# Patient Record
Sex: Female | Born: 1970 | Race: Black or African American | Hispanic: No | Marital: Married | Smoking: Never smoker
Health system: Southern US, Community
[De-identification: ages and names within clinical notes are randomized; demographics above are authoritative.]

## PROBLEM LIST (undated history)

## (undated) DIAGNOSIS — F3281 Premenstrual dysphoric disorder: Secondary | ICD-10-CM

## (undated) DIAGNOSIS — I10 Essential (primary) hypertension: Secondary | ICD-10-CM

## (undated) DIAGNOSIS — E1159 Type 2 diabetes mellitus with other circulatory complications: Secondary | ICD-10-CM

## (undated) DIAGNOSIS — E1165 Type 2 diabetes mellitus with hyperglycemia: Secondary | ICD-10-CM

## (undated) DIAGNOSIS — E785 Hyperlipidemia, unspecified: Secondary | ICD-10-CM

## (undated) DIAGNOSIS — E781 Pure hyperglyceridemia: Secondary | ICD-10-CM

## (undated) DIAGNOSIS — I2510 Atherosclerotic heart disease of native coronary artery without angina pectoris: Secondary | ICD-10-CM

## (undated) DIAGNOSIS — Z91148 Patient's other noncompliance with medication regimen for other reason: Secondary | ICD-10-CM

## (undated) DIAGNOSIS — IMO0001 Reserved for inherently not codable concepts without codable children: Secondary | ICD-10-CM

## (undated) DIAGNOSIS — I219 Acute myocardial infarction, unspecified: Secondary | ICD-10-CM

## (undated) DIAGNOSIS — Z9114 Patient's other noncompliance with medication regimen: Secondary | ICD-10-CM

## (undated) HISTORY — DX: Atherosclerotic heart disease of native coronary artery without angina pectoris: I25.10

## (undated) HISTORY — PX: CORONARY STENT PLACEMENT: SHX1402

## (undated) HISTORY — DX: Type 2 diabetes mellitus with other circulatory complications: E11.59

## (undated) HISTORY — DX: Reserved for inherently not codable concepts without codable children: IMO0001

## (undated) HISTORY — DX: Patient's other noncompliance with medication regimen: Z91.14

## (undated) HISTORY — DX: Patient's other noncompliance with medication regimen for other reason: Z91.148

## (undated) HISTORY — DX: Hyperlipidemia, unspecified: E78.5

## (undated) HISTORY — DX: Type 2 diabetes mellitus with hyperglycemia: E11.65

## (undated) HISTORY — DX: Premenstrual dysphoric disorder: F32.81

## (undated) HISTORY — DX: Essential (primary) hypertension: I10

## (undated) HISTORY — DX: Pure hyperglyceridemia: E78.1

## (undated) HISTORY — DX: Morbid (severe) obesity due to excess calories: E66.01

## (undated) HISTORY — DX: Acute myocardial infarction, unspecified: I21.9

---

## 1999-06-11 ENCOUNTER — Emergency Department (HOSPITAL_COMMUNITY): Admission: EM | Admit: 1999-06-11 | Discharge: 1999-06-11 | Payer: Self-pay | Admitting: Emergency Medicine

## 1999-10-28 ENCOUNTER — Other Ambulatory Visit: Admission: RE | Admit: 1999-10-28 | Discharge: 1999-10-28 | Payer: Self-pay | Admitting: Obstetrics and Gynecology

## 2000-11-11 ENCOUNTER — Other Ambulatory Visit: Admission: RE | Admit: 2000-11-11 | Discharge: 2000-11-11 | Payer: Self-pay | Admitting: Obstetrics and Gynecology

## 2000-11-12 ENCOUNTER — Ambulatory Visit (HOSPITAL_COMMUNITY): Admission: RE | Admit: 2000-11-12 | Discharge: 2000-11-12 | Payer: Self-pay | Admitting: Gastroenterology

## 2000-12-17 ENCOUNTER — Other Ambulatory Visit: Admission: RE | Admit: 2000-12-17 | Discharge: 2000-12-17 | Payer: Self-pay | Admitting: Obstetrics and Gynecology

## 2000-12-17 ENCOUNTER — Encounter (INDEPENDENT_AMBULATORY_CARE_PROVIDER_SITE_OTHER): Payer: Self-pay

## 2001-03-04 ENCOUNTER — Ambulatory Visit (HOSPITAL_COMMUNITY): Admission: RE | Admit: 2001-03-04 | Discharge: 2001-03-04 | Payer: Self-pay | Admitting: Obstetrics and Gynecology

## 2001-03-04 ENCOUNTER — Encounter (INDEPENDENT_AMBULATORY_CARE_PROVIDER_SITE_OTHER): Payer: Self-pay | Admitting: Specialist

## 2001-06-01 ENCOUNTER — Other Ambulatory Visit: Admission: RE | Admit: 2001-06-01 | Discharge: 2001-06-01 | Payer: Self-pay | Admitting: Obstetrics and Gynecology

## 2001-09-30 ENCOUNTER — Other Ambulatory Visit: Admission: RE | Admit: 2001-09-30 | Discharge: 2001-09-30 | Payer: Self-pay | Admitting: Obstetrics and Gynecology

## 2002-02-08 ENCOUNTER — Other Ambulatory Visit: Admission: RE | Admit: 2002-02-08 | Discharge: 2002-02-08 | Payer: Self-pay | Admitting: Obstetrics and Gynecology

## 2002-06-22 ENCOUNTER — Other Ambulatory Visit: Admission: RE | Admit: 2002-06-22 | Discharge: 2002-06-22 | Payer: Self-pay | Admitting: Obstetrics and Gynecology

## 2002-09-27 ENCOUNTER — Other Ambulatory Visit: Admission: RE | Admit: 2002-09-27 | Discharge: 2002-09-27 | Payer: Self-pay | Admitting: Obstetrics and Gynecology

## 2003-03-13 ENCOUNTER — Other Ambulatory Visit: Admission: RE | Admit: 2003-03-13 | Discharge: 2003-03-13 | Payer: Self-pay | Admitting: Obstetrics and Gynecology

## 2003-04-15 ENCOUNTER — Encounter (INDEPENDENT_AMBULATORY_CARE_PROVIDER_SITE_OTHER): Payer: Self-pay

## 2003-04-15 ENCOUNTER — Ambulatory Visit (HOSPITAL_COMMUNITY): Admission: RE | Admit: 2003-04-15 | Discharge: 2003-04-15 | Payer: Self-pay | Admitting: Obstetrics and Gynecology

## 2003-04-19 ENCOUNTER — Encounter (INDEPENDENT_AMBULATORY_CARE_PROVIDER_SITE_OTHER): Payer: Self-pay

## 2003-04-19 ENCOUNTER — Ambulatory Visit (HOSPITAL_COMMUNITY): Admission: AD | Admit: 2003-04-19 | Discharge: 2003-04-19 | Payer: Self-pay | Admitting: Obstetrics and Gynecology

## 2003-10-16 ENCOUNTER — Other Ambulatory Visit: Admission: RE | Admit: 2003-10-16 | Discharge: 2003-10-16 | Payer: Self-pay | Admitting: Obstetrics and Gynecology

## 2004-05-14 ENCOUNTER — Ambulatory Visit (HOSPITAL_COMMUNITY): Admission: RE | Admit: 2004-05-14 | Discharge: 2004-05-14 | Payer: Self-pay | Admitting: *Deleted

## 2004-07-04 ENCOUNTER — Encounter (INDEPENDENT_AMBULATORY_CARE_PROVIDER_SITE_OTHER): Payer: Self-pay | Admitting: Specialist

## 2004-07-04 ENCOUNTER — Ambulatory Visit (HOSPITAL_COMMUNITY): Admission: RE | Admit: 2004-07-04 | Discharge: 2004-07-04 | Payer: Self-pay | Admitting: General Surgery

## 2004-10-07 ENCOUNTER — Emergency Department (HOSPITAL_COMMUNITY): Admission: EM | Admit: 2004-10-07 | Discharge: 2004-10-07 | Payer: Self-pay | Admitting: Emergency Medicine

## 2008-01-10 ENCOUNTER — Emergency Department (HOSPITAL_COMMUNITY): Admission: EM | Admit: 2008-01-10 | Discharge: 2008-01-10 | Payer: Self-pay | Admitting: Emergency Medicine

## 2008-10-23 ENCOUNTER — Emergency Department (HOSPITAL_COMMUNITY): Admission: EM | Admit: 2008-10-23 | Discharge: 2008-10-23 | Payer: Self-pay | Admitting: Family Medicine

## 2009-03-07 ENCOUNTER — Inpatient Hospital Stay (HOSPITAL_COMMUNITY): Admission: EM | Admit: 2009-03-07 | Discharge: 2009-03-10 | Payer: Self-pay | Admitting: Emergency Medicine

## 2009-03-22 ENCOUNTER — Encounter (HOSPITAL_COMMUNITY): Admission: RE | Admit: 2009-03-22 | Discharge: 2009-06-20 | Payer: Self-pay | Admitting: Interventional Cardiology

## 2010-11-08 LAB — GLUCOSE, CAPILLARY
Glucose-Capillary: 183 mg/dL — ABNORMAL HIGH (ref 70–99)
Glucose-Capillary: 195 mg/dL — ABNORMAL HIGH (ref 70–99)
Glucose-Capillary: 207 mg/dL — ABNORMAL HIGH (ref 70–99)
Glucose-Capillary: 212 mg/dL — ABNORMAL HIGH (ref 70–99)
Glucose-Capillary: 217 mg/dL — ABNORMAL HIGH (ref 70–99)
Glucose-Capillary: 219 mg/dL — ABNORMAL HIGH (ref 70–99)
Glucose-Capillary: 229 mg/dL — ABNORMAL HIGH (ref 70–99)
Glucose-Capillary: 271 mg/dL — ABNORMAL HIGH (ref 70–99)
Glucose-Capillary: 145 mg/dL — ABNORMAL HIGH (ref 70–99)
Glucose-Capillary: 148 mg/dL — ABNORMAL HIGH (ref 70–99)
Glucose-Capillary: 153 mg/dL — ABNORMAL HIGH (ref 70–99)
Glucose-Capillary: 160 mg/dL — ABNORMAL HIGH (ref 70–99)
Glucose-Capillary: 160 mg/dL — ABNORMAL HIGH (ref 70–99)

## 2010-11-09 LAB — POCT I-STAT, CHEM 8
BUN: 25 mg/dL — ABNORMAL HIGH (ref 6–23)
BUN: 30 mg/dL — ABNORMAL HIGH (ref 6–23)
Calcium, Ion: 1.19 mmol/L (ref 1.12–1.32)
Chloride: 99 mEq/L (ref 96–112)
Creatinine, Ser: 0.7 mg/dL (ref 0.4–1.2)
Creatinine, Ser: 0.8 mg/dL (ref 0.4–1.2)
Glucose, Bld: 335 mg/dL — ABNORMAL HIGH (ref 70–99)
Glucose, Bld: 392 mg/dL — ABNORMAL HIGH (ref 70–99)
HCT: 47 % — ABNORMAL HIGH (ref 36.0–46.0)
Hemoglobin: 16 g/dL — ABNORMAL HIGH (ref 12.0–15.0)
Potassium: 4.6 mEq/L (ref 3.5–5.1)
Sodium: 123 mEq/L — ABNORMAL LOW (ref 135–145)
Sodium: 130 mEq/L — ABNORMAL LOW (ref 135–145)
TCO2: 21 mmol/L (ref 0–100)
TCO2: 24 mmol/L (ref 0–100)

## 2010-11-09 LAB — CBC
HCT: 41.7 % (ref 36.0–46.0)
Hemoglobin: 13.4 g/dL (ref 12.0–15.0)
Hemoglobin: 14.1 g/dL (ref 12.0–15.0)
MCHC: 33.7 g/dL (ref 30.0–36.0)
MCV: 87.8 fL (ref 78.0–100.0)
MCV: 88 fL (ref 78.0–100.0)
Platelets: 385 10*3/uL (ref 150–400)
RBC: 4.13 MIL/uL (ref 3.87–5.11)
RBC: 4.44 MIL/uL (ref 3.87–5.11)
RBC: 4.74 MIL/uL (ref 3.87–5.11)
RDW: 11.6 % (ref 11.5–15.5)
WBC: 11.5 10*3/uL — ABNORMAL HIGH (ref 4.0–10.5)
WBC: 12.5 10*3/uL — ABNORMAL HIGH (ref 4.0–10.5)

## 2010-11-09 LAB — GLUCOSE, CAPILLARY
Glucose-Capillary: 171 mg/dL — ABNORMAL HIGH (ref 70–99)
Glucose-Capillary: 206 mg/dL — ABNORMAL HIGH (ref 70–99)
Glucose-Capillary: 197 mg/dL — ABNORMAL HIGH (ref 70–99)
Glucose-Capillary: 300 mg/dL — ABNORMAL HIGH (ref 70–99)
Glucose-Capillary: 332 mg/dL — ABNORMAL HIGH (ref 70–99)
Glucose-Capillary: 348 mg/dL — ABNORMAL HIGH (ref 70–99)
Glucose-Capillary: 372 mg/dL — ABNORMAL HIGH (ref 70–99)
Glucose-Capillary: 430 mg/dL — ABNORMAL HIGH (ref 70–99)

## 2010-11-09 LAB — BASIC METABOLIC PANEL
BUN: 15 mg/dL (ref 6–23)
BUN: 23 mg/dL (ref 6–23)
CO2: 21 mEq/L (ref 19–32)
CO2: 22 mEq/L (ref 19–32)
CO2: 24 mEq/L (ref 19–32)
Calcium: 10.2 mg/dL (ref 8.4–10.5)
Chloride: 100 mEq/L (ref 96–112)
Chloride: 93 mEq/L — ABNORMAL LOW (ref 96–112)
Chloride: 99 mEq/L (ref 96–112)
Creatinine, Ser: 0.68 mg/dL (ref 0.4–1.2)
Creatinine, Ser: 0.9 mg/dL (ref 0.4–1.2)
GFR calc Af Amer: >60 mL/min (ref >60)
GFR calc Af Amer: >60 mL/min (ref >60)
GFR calc non Af Amer: >60 mL/min (ref >60)
Glucose, Bld: 206 mg/dL — ABNORMAL HIGH (ref 70–99)
Glucose, Bld: 397 mg/dL — ABNORMAL HIGH (ref 70–99)
Glucose, Bld: 444 mg/dL — ABNORMAL HIGH (ref 70–99)
Potassium: 3.8 mEq/L (ref 3.5–5.1)
Potassium: 4.5 mEq/L (ref 3.5–5.1)
Potassium: 4.7 mEq/L (ref 3.5–5.1)
Sodium: 127 mEq/L — ABNORMAL LOW (ref 135–145)
Sodium: 128 mEq/L — ABNORMAL LOW (ref 135–145)

## 2010-11-09 LAB — POCT I-STAT 3, ART BLOOD GAS (G3+)
Acid-base deficit: 6 mmol/L — ABNORMAL HIGH (ref 0.0–2.0)
O2 Saturation: 99 %
TCO2: 22 mmol/L (ref 0–100)
pCO2 arterial: 42.7 mmHg (ref 35.0–45.0)
pO2, Arterial: 135 mmHg — ABNORMAL HIGH (ref 80.0–100.0)

## 2010-11-09 LAB — TROPONIN I: Troponin I: 0.05 ng/mL (ref 0.00–0.06)

## 2010-11-09 LAB — PROTIME-INR
INR: 0.9 (ref 0.00–1.49)
Prothrombin Time: 12.4 seconds (ref 11.6–15.2)

## 2010-11-09 LAB — HEMOGLOBIN A1C
Hgb A1c MFr Bld: 11.6 % — ABNORMAL HIGH (ref 4.6–6.1)
Mean Plasma Glucose: 286 mg/dL
Mean Plasma Glucose: 286 mg/dL

## 2010-11-09 LAB — CARDIAC PANEL(CRET KIN+CKTOT+MB+TROPI)
CK, MB: 81.7 ng/mL — ABNORMAL HIGH (ref 0.3–4.0)
Relative Index: 14.8 — ABNORMAL HIGH (ref 0.0–2.5)
Relative Index: 15.1 — ABNORMAL HIGH (ref 0.0–2.5)
Relative Index: 15.5 — ABNORMAL HIGH (ref 0.0–2.5)
Total CK: 365 U/L — ABNORMAL HIGH (ref 7–177)
Troponin I: 5.39 ng/mL (ref 0.00–0.06)

## 2010-11-09 LAB — DIFFERENTIAL
Basophils Absolute: 0 10*3/uL (ref 0.0–0.1)
Basophils Relative: 0 % (ref 0–1)
Eosinophils Absolute: 0 10*3/uL (ref 0.0–0.7)
Eosinophils Relative: 0 % (ref 0–5)
Lymphocytes Relative: 13 % (ref 12–46)
Lymphs Abs: 1.5 10*3/uL (ref 0.7–4.0)
Monocytes Absolute: 0.3 10*3/uL (ref 0.1–1.0)
Monocytes Relative: 3 % (ref 3–12)
Neutro Abs: 9.7 10*3/uL — ABNORMAL HIGH (ref 1.7–7.7)
Neutrophils Relative %: 84 % — ABNORMAL HIGH (ref 43–77)

## 2010-11-09 LAB — CK TOTAL AND CKMB (NOT AT ARMC)
CK, MB: 1.7 ng/mL (ref 0.3–4.0)
Relative Index: INVALID (ref 0.0–2.5)
Total CK: 88 U/L (ref 7–177)

## 2010-11-09 LAB — POCT CARDIAC MARKERS

## 2010-11-09 LAB — LIPID PANEL
Cholesterol: 256 mg/dL — ABNORMAL HIGH (ref 0–200)
LDL Cholesterol: 164 mg/dL — ABNORMAL HIGH (ref 0–99)
Triglycerides: 224 mg/dL — ABNORMAL HIGH (ref <150)
VLDL: 45 mg/dL — ABNORMAL HIGH (ref 0–40)

## 2010-11-09 LAB — APTT: aPTT: 26 seconds (ref 24–37)

## 2010-12-17 NOTE — Consult Note (Signed)
NAME:  Barbara Jacobs, GORBY NO.:  [id number]   MEDICAL RECORD NO.:  [id number]          PATIENT TYPE:  INP   LOCATION:  2033                         FACILITY:  [facility]   PHYSICIAN:  Arne Cleveland, MD       DATE OF BIRTH:  [birth year]   DATE OF CONSULTATION:  DATE OF DISCHARGE:                                 CONSULTATION   REQUESTING PHYSICIAN:  Lyn Records, MD   Dr. Katrinka Blazing requested consult to review his current orders regarding her  diabetes and to see if his management was appropriate and make  recommendations regarding her diabetic management.  Ms. Christell Constant is a 40-  year-old African American female who was recently admitted with acute  onset of chest pain, taken to the cath and found to have 100% occlusion  of the right coronary artery and had successful stenting of the  posterior descending artery or the right coronary artery.  The patient's  other arteries were patent and had good heart function.  Postop, the  patient has had high sugars, which corresponds with her high-degree of  stress following an acute MI.  The patient had been well controlled with  sliding scale.  She was continued on her Actos, but her sugars remained  high, so Dr. Katrinka Blazing added Glucotrol 5 mg daily to her regimen of Actos.   PHYSICAL EXAMINATION:  GENERAL:  She is a well-developed, well-nourished  African American female obese, in no acute distress.  Her sugars have  been high, but controlled on a sliding scale.  She is adding Glucotrol  to her regimen.  HEAD, EYES, EARS, NOSE, AND THROAT:  Appeared normal.  CHEST:  Clear to auscultation and percussion.  HEART:  Regular rhythm and rate.  Normal S1 and S2 without murmur,  gallop, or rub.  ABDOMEN:  Obese, soft, and nontender with normoactive bowel sounds.  No  hepatomegaly.  No splenomegaly.  No palpable mass.  EXTREMITIES:  No clubbing, cyanosis, or edema.   My impression is diabetes poorly controlled, post acute myocardial  infarction, and  stenting.  The patient currently is under significant  stress from her acute event and adding Glucotrol to control her  hyperglycemia is appropriate.  I agree with Dr. Michaelle Copas current  management.   Plan is to continue with Dr. Michaelle Copas current management.  Recommend to  the patient that she enter a cardiac rehab as well as dietary evaluation  for diabetes, recommend American Diabetes and American Heart Association  1800-calorie diet.  I also recommended to the patient that she should  monitor her sugars with fingersticks 3 times a day for first week until  she goes back and sees her primary care doctor as a stress from her  acute event decreases her requirement for the Glucotrol may go down  especially if she is continuing on a heart-healthy diabetic diet.  The  patient also has hypertension, hyperlipidemia, which are being also  correctly managed by Dr. Katrinka Blazing and have no recommendations other than  his current medicines.  The patient may be able to be discharged home in  her regimen of Glucotrol 5 mg daily, metoprolol 50 mg twice a day,  lisinopril 20 mg daily, and hydrochlorothiazide 25 mg daily  is very global for this situation.  The patient should continue to  monitor her sugar and risk factors, diabetic, cardiovascular; dietary  and exercise, which will come under play with her cardiac rehab and  should be followed up as an outpatient.      Arne Cleveland, MD  Electronically Signed     ML/MEDQ  D:  03/09/2009  T:  03/10/2009  Job:  [id number]

## 2010-12-17 NOTE — Cardiovascular Report (Signed)
NAMECAMREIGH, MICHIE                 ACCOUNT NO.:  [id number]   MEDICAL RECORD NO.:  [id number]          PATIENT TYPE:  INP   LOCATION:  2906                         FACILITY:  [facility]   PHYSICIAN:  Lyn Records, M.D.   DATE OF BIRTH:  06-May-1971   DATE OF PROCEDURE:  03/07/2009  DATE OF DISCHARGE:                            CARDIAC CATHETERIZATION   INDICATION FOR THE PROCEDURE:  Acute inferior infarction starting at  around 3:30 p.m.   PROCEDURE PERFORMED:  1. Left heart catheterization.  2. Selective coronary angiography.  3. Left ventriculography.  4. Bare metal stent posterior descending of the right coronary.   DESCRIPTION:  The patient was brought emergently to the catheterization  laboratory after presenting with inferior ST elevation and 4 hours of  continuous chest discomfort described as a pressure.  The EKG in the  emergency room demonstrated inferior ST elevation.   After explaining the procedure to the patient, she agreed to proceed  with emergency catheterization.  A 6-French sheath was placed using the  modified Seldinger technique.  Xylocaine 1% was used for local  anesthesia.   We then performed coronary angiography using a 6-French A2 multipurpose  catheter.  This was used for hemodynamic recordings and left  ventriculography by a hand injection.  We were unable to cannulate the  native coronaries with the catheter because of a relatively short aortic  root.  We then used a #4, 6-French left Judkins and a #4, 6-French right  coronary guide catheter for angiography.  After the right coronary  shots, we were able to demonstrate that there was total occlusion of the  PDA.   Angiomax was then administered.  ACT was documented to be 368 seconds.  The patient received 60 mg of Effient.  Angioplasty was performed using  an [employer] guidewire, a 2.0 and then a 2.25 mm diameter, 12-mm  long apex balloon, and ultimately a 2.25 mm x 12-mm long Vision  Multilink  stent was placed in the region of the stenosis and 2 balloon  inflations were performed.  Peak pressure 12 atmospheres.   There was resolution of ST elevation and chest discomfort with  reperfusion.  The patient did develop nausea and some increased chest  discomfort immediately post reperfusion.  No significant arrhythmias  occurred.   The patient's right groin was reprepped.  A sheath injection was  performed.  An Angio-Seal was used for hemostasis with success.  The  patient was given 1 mg of IV Ancef.   RESULTS:  1. Hemodynamic data:      a.     Aortic pressure 133/85.      b.     Left ventricular pressure 137/14.  2. Left ventriculography:  Inferolateral hypokinesis.  EF 60%.  3. Coronary angiography.      a.     Left main coronary:  Widely patent.      b.     Left anterior descending coronary:  Large and wraps around       left ventricular apex.  Luminal irregularities are noted after a  large bifurcating first diagonal.  The diagonal and its branches       are both normal.      c.     Circumflex artery:  Small and widely patent.      d.     Right coronary:  Right coronary is a large, dominant vessel       that gives a small codominant first PDA and a larger totally       occluded second PDA.  A smaller left ventricular branch was noted       beyond the origin of the second PDA branch.  4. PCI result:  PDA totally occluded to 0% stenosis after angioplasty      and bare metal stent to 0% stenosis and TIMI grade 3 flow.   CONCLUSIONS:  1. Acute inferior myocardial infarction 3-1/2 to 4 hours old by the      time of arrival.  2. Successful angioplasty and stenting of the PDA with reduction in      stenosis from 100% to 0% with TIMI grade 3 flow.  Symptom onset was      at 1530.  Reperfusion occurred at 2120.  Arrival time in the ER is      unclear.  3. Overall normal LV function with inferior hypokinesis.  4. Widely patent LAD and circumflex.   PLAN:  1. Aspirin and  Effient.  2. Risk factor modification.  3. Statin therapy should be added to the patient's regimen.  4. Weight reduction will be stressed.  5. Cardiac rehab.  6. Cycle enzymes.      Lyn Records, M.D.  Electronically Signed     HWS/MEDQ  D:  03/07/2009  T:  03/08/2009  Job:  [id number]   cc:   Kendrick Ranch, M.D.

## 2010-12-17 NOTE — Discharge Summary (Signed)
Barbara Jacobs, Barbara Jacobs                 ACCOUNT NO.:  [id number]   MEDICAL RECORD NO.:  [id number]          PATIENT TYPE:  INP   LOCATION:  2033                         FACILITY:  [facility]   PHYSICIAN:  Lyn Records, M.D.   DATE OF BIRTH:  03/01/1971   DATE OF ADMISSION:  03/07/2009  DATE OF DISCHARGE:                               DISCHARGE SUMMARY   DISCHARGE DIAGNOSES:  1. Acute inferior myocardial infarction.      a.     Treated with bare-metal stent to the proximal posterior       descending artery on March 07, 2009.      b.     Overall normal left ventricular function with ejection       fraction of 60%, inferior wall moderate hypokinesis.  2. Hypertension, poorly controlled.  3. Diabetes mellitus, poorly controlled.      a.     Initial blood sugars greater than 400.      b.     Hemoglobin A1c greater than 11.  4. Obesity.   PROCEDURES PERFORMED:  Emergency coronary angiography and bare-metal  stent to the PDA on March 07, 2009.   DISCHARGE INSTRUCTIONS:  1. Follow with Dr. Constance Goltz on March 19, 2009 for further      management, instructions concerning diabetes.  2. Follow up with Dr. Verdis Prime on March 21, 2009 at 11 a.m.   MEDICATIONS AT DISCHARGE:  1. Effient 10 mg per day for 30 days.  2. Coated aspirin 325 mg per day for an indefinite period of time      (lifetime).  3. Lisinopril/hydrochlorothiazide 20/25 mg daily.  4. Metoprolol succinate 50 mg daily.  5. Simvastatin 80 mg daily.  6. Nitroglycerin 0.4 mg sublingually p.r.n.  7. Actos 15 mg daily as before.  8. Glucotrol XL 5 mg daily.   ACTIVITIES:  Enroll in phase II cardiac rehab.  The patient is  restricted from working for at least 2 weeks and certainly until after  being seen by Dr. Katrinka Blazing on March 21, 2009.  Specific instructions are  given to call if recurrent chest pain, require a nitroglycerin.   DIET:  Heart healthy 1800-calorie ADA diet.   CONDITION ON DISCHARGE:  Improved.   HOSPITAL COURSE:   The patient suddenly developed chest discomfort  shortly after lunch on the afternoon of March 07, 2009.  She interpreted  the septum to represent indigestion.  She eventually came to the  Emergency Room at [facility] in the early evening, around 8  o'clock where her EKG revealed inferior ST elevation.  She was taken  immediately to the catheterization laboratory where she was found to  have a totally occluded proximal portion of her PDA.  Angioplasty and  bare-metal stenting was performed with good results and resolution of ST  elevation as well as symptoms.   The post PCI course was unremarkable.  Her medications were titrated to  achieve good blood pressure control, and to aim towards improved control  of her blood sugars.  Initial blood sugars were greater than 400.   Renal  function is normal with creatinine is less than 1.2.   Peak CPK was 550/80.   EKG at discharge reveals inferior Q-waves in II, III, and aVF.  Ejection  fraction and catheterization, however, was normal with inferobasal  moderate hypokinesis.   Part of the patient's difficulty with blood sugar control during the  hospitalization was intravenous Solu-Medrol administration prior to cath  because of a history of shellfish allergy.      Lyn Records, M.D.  Electronically Signed     HWS/MEDQ  D:  03/09/2009  T:  03/09/2009  Job:  [id number]   cc:   Kendrick Ranch, M.D.

## 2010-12-17 NOTE — Cardiovascular Report (Signed)
NAMETAWSHA, TERRERO NO.:  [id number]   MEDICAL RECORD NO.:  [id number]           PATIENT TYPE:   LOCATION:                                 FACILITY:   PHYSICIAN:  Lyn Records, M.D.   DATE OF BIRTH:  07/25/1945   DATE OF PROCEDURE:  03/07/2009  DATE OF DISCHARGE:                            CARDIAC CATHETERIZATION   ADDENDUM   [name] was brought to the cardiac catheterization laboratory  emergently.  She is a relatively short, obese [place] American female  who has a short aortic root.  Because of this anatomy, multiple  catheter changes were necessary to visualize the coronary anatomy.  Additionally because of her obesity, access to the femoral artery took  more time than usual.  These 2 factors increased the time to  reperfusion.      Lyn Records, M.D.  Electronically Signed     HWS/MEDQ  D:  03/22/2009  T:  03/22/2009  Job:  [id number]

## 2010-12-20 NOTE — Op Note (Signed)
NAME:  Barbara Jacobs, Barbara Jacobs                 ACCOUNT NO.:  [id number]   MEDICAL RECORD NO.:  [id number]          PATIENT TYPE:  AMB   LOCATION:  DAY                          FACILITY:  [facility]   PHYSICIAN:  Ollen Gross. Vernell Morgans, M.D. DATE OF BIRTH:  14-Apr-1971   DATE OF PROCEDURE:  07/04/2004  DATE OF DISCHARGE:                                 OPERATIVE REPORT   PREOPERATIVE DIAGNOSIS:  Anal condyloma.   POSTOPERATIVE DIAGNOSIS:  Anal condyloma.   PROCEDURE:  Destruction of anal condyloma.   SURGEON:  Dr. Carolynne Edouard.   ANESTHESIA:  General endotracheal.   PROCEDURE:  After informed consent was obtained, the patient was brought to  the operating room and placed in a supine position on the operating room  table. After adequate induction of general anesthesia, the patient was  placed in the lithotomy position. Her perirectal area was prepped with  Betadine and draped in usual sterile manner. The patient had 1 tuft of  condyloma posteriorly that appeared to be on a stalk. This was excised  sharply at the base of the stalk with the Metzenbaum scissors, and  hemostasis was then achieved using the Bovie electrocautery. Two other small  areas in the left perirectal area were identified, and each of these were  destroyed with the Bovie electrocautery. No other areas in the perirectal  skin or in the anal canal were able to be identified. The perirectal area  was infiltrated with 0.25% Marcaine with epinephrine, and dibucaine ointment  was applied to the areas, and sterile dressings were applied. The patient  tolerated the procedure well. At the end of the case, all needle, sponge,  and instrument counts were correct. The patient was then awakened and taken  to recovery room in stable condition.     Renae Fickle   PST/MEDQ  D:  07/04/2004  T:  07/04/2004  Job:  [id number]

## 2010-12-20 NOTE — Op Note (Signed)
NAME:  Barbara Jacobs, Barbara Jacobs                           ACCOUNT NO.:  [id number]   MEDICAL RECORD NO.:  [id number]                   PATIENT TYPE:  AMB   LOCATION:  MATC                                 FACILITY:  [facility]   PHYSICIAN:  Maxie Better, M.D.            DATE OF BIRTH:  29-Apr-1971   DATE OF PROCEDURE:  04/19/2003  DATE OF DISCHARGE:                                 OPERATIVE REPORT   PREOPERATIVE DIAGNOSES:  Hematometria.   PROCEDURE:  1. Suction dilation evacuation.  2. Excision of right buttock lesion.   POSTOPERATIVE DIAGNOSES:  1. Hematometria.  2. Right buttock lesion.   ANESTHESIA:  Saddle block; paracervical block.   SURGEON:  Maxie Better, M.D.   INDICATIONS:  This is a 40 year old para 1 female who is status post an  elective termination on April 15, 2003.  She presented to the office  with complaints of heavy vaginal bleeding since Monday, and who on  ultrasound was found to have an echogenic 4 cm endometrial stripe;  consistent with possible blood of retained products of conception.  She now  presents for surgical management.  The patient has chronic hypertension and  diabetes.  She denies any fever.  She has had minimal cramps.  Risks and  benefits and plans of the procedure were explained to the patient.   DESCRIPTION OF PROCEDURE:  Under adequate saddle block, the patient was  placed in dorsal lithotomy position.  Her vulva was noted to be smeared with  blood.  The patient was sterilely prepped and draped in the usual fashion.  The bladder was catheterized of a small amount of urine.   Bimanual examination revealed an anteverted uterus, about 7 weeks size;  which was smaller than what had been examined in the office.  No adnexal  masses could be appreciated.   The patient was then draped in the usual sterile fashion.  A bivalve  speculum was placed in the vagina.  A single-toothed tenaculum was placed on  the anterior lip of the cervix.  Then  20 cc of 1% Nesacaine was injected  paracervically at 3 o'clock and 9 o'clock.  The cervix was then serially  dilated up to #25 [facility] dilator.  The uterus was sounded to about 3 inches.  The 7 mm curved suction cannula was introduced into the uterine cavity.  A  scant amount of clotted material was obtained.  The cavity was then curetted  and resuctioned.  It was felt the patient probably passed the majority of  her clotted material.  The bivalve speculum was then removed.  The tenaculum  was also removed.   On inspection, the right buttock had a polypoid lesion and several other  small lesions in the perineal area. The buttock lesion was then removed with  a scalpel, and the base cauterized with silver nitrate stick.   SPECIMENS:  Right buttock lesion and tissue from the endometrial  cavity.   ESTIMATED BLOOD LOSS:  Minimal.   FLUID INPUT:  900 cc.   URINE OUTPUT:  Adequate.   COMPLICATIONS:  None.   DISPOSITION:  The patient tolerated the procedure well.  She was transferred  to the recovery room in stable condition.                                               Maxie Better, M.D.    [place]/MEDQ  D:  04/19/2003  T:  04/19/2003  Job:  [id number]

## 2010-12-20 NOTE — Op Note (Signed)
   NAME:  Barbara Jacobs, Barbara Jacobs                           ACCOUNT NO.:  [id number]   MEDICAL RECORD NO.:  [id number]                   PATIENT TYPE:  AMB   LOCATION:  SDC                                  FACILITY:  [facility]   PHYSICIAN:  Maxie Better, M.D.            DATE OF BIRTH:  08-Feb-1971   DATE OF PROCEDURE:  04/15/2003  DATE OF DISCHARGE:                                 OPERATIVE REPORT   PREOPERATIVE DIAGNOSIS:  Elective termination.   PROCEDURE:  Suction dilation and evaluation.   POSTOPERATIVE DIAGNOSIS:  Elective termination.   ANESTHESIA:  Saddle block, paracervical block.   SURGEON:  Maxie Better, M.D.   INDICATIONS:  This is a 40 year old gravida 4, para 1-1-1-1, female, at  around 58 weeks' gestation with multiple medical problems, who now presents  for elective termination.  Risks and benefits of the procedure have been  explained to the patient.  Consent was signed.  The patient was transferred  to the operating room.   DESCRIPTION OF PROCEDURE:  Under adequate saddle block anesthesia, the  patient was placed in the dorsal lithotomy position.  She was sterilely  prepped and draped in the usual fashion.  The bladder was catheterized for a  small amount of urine.  Examination under anesthesia revealed a 13-week size  anteverted uterus.  A bivalve speculum was placed in the vagina.  A single-  tooth tenaculum was placed on the anterior lip of the cervix, and 20 mL of  1% Nesacaine was injected paracervically.  The cervix was serially dilated  up to a #37 [employer] dilator, and a #12 mm curved suction cannula was  introduced into the uterine cavity.  The cavity was suctioned, curetted, and  explored using uterine dressing clamps with removal of tissue.  The tissue  was inspected for identification of the significant fetal parts, including  the calvarium, and the cavity was then curetted, re-suctioned.  It was felt  to have been emptied, and therefore all instruments were  then removed from  the vagina.  A specimen labeled products of conception was sent to  pathology.  Estimated blood loss was 100 mL.  Complication was none.  The  patient is B negative, will receive RhoGAM in the recovery room.  The  patient was transferred to the recovery room in stable condition.                                               Maxie Better, M.D.    [place]/MEDQ  D:  04/15/2003  T:  04/16/2003  Job:  [id number]

## 2010-12-20 NOTE — Op Note (Signed)
[facility]  Patient:    Barbara, Jacobs                        MRN: [id number] Proc. Date: 03/04/02 Adm. Date:  [id number] Attending:  Maxie Better                           Operative Report  PREOPERATIVE DIAGNOSIS:       Elective termination.  POSTOPERATIVE DIAGNOSIS:      Elective termination.  OPERATION:                    Suction, dilation, and evacuation.  SURGEON:                      Sheronette A. Cherly Hensen, M.D.  ANESTHESIA:                   MAC/paracervical block.  ESTIMATED BLOOD LOSS:         Minimal.  INDICATIONS:                  This is a 40 year old, gravida 2, para 1, female at about 7+ weeks gestataion who presents for elective termination.  Risks and benefits of the procedure had been explained to the patient.  Consent was signed.  The patient was transferred to the operating room.  DESCRIPTION OF PROCEDURE:     Under adequate monitored anesthesia, the patient was placed in the dorsal lithotomy position.  She was sterilely prepped and draped in the usual fashion.  The bladder was catheterized with a small amount of urine.  Examination under anesthesia had revealed an anteverted uterus but the adnexa was limited by the patients body habitus.  Bivalve speculum was placed in the vagina.  The vagina was again bathed with Betadine solution.  Then 21 cc total of 1% Nesacaine was injected paracervically at 3 and 9 oclock.  A small amount was placed on the anterior lip of the cervix.  The cervix was subsequently grasped with a single-tooth tenaculum.  The cervix was then serially dilated up to #27 [facility] dilator.  A 7 mm curved suction cannula was introduced into the uterine cavity.  Moderate to moderate tissue was obtained.  The cavity was the curetted, resuctioned, and curetted until the cavity was felt to be clean; at which time, all instruments were then removed from the vagina.  Specimen label products of conception was  sent to pathology.  COMPLICATIONS:                None.  The patient tolerated the procedure well and was transferred to the recovery room in stable condition.DD:  03/04/01 TD:  03/05/01 Job: [id number]

## 2010-12-25 ENCOUNTER — Other Ambulatory Visit: Payer: Self-pay | Admitting: Obstetrics and Gynecology

## 2012-04-28 ENCOUNTER — Institutional Professional Consult (permissible substitution): Payer: Self-pay | Admitting: Family Medicine

## 2012-05-05 ENCOUNTER — Institutional Professional Consult (permissible substitution): Payer: Self-pay | Admitting: Family Medicine

## 2012-05-12 ENCOUNTER — Institutional Professional Consult (permissible substitution): Payer: Self-pay | Admitting: Family Medicine

## 2012-05-31 ENCOUNTER — Encounter (INDEPENDENT_AMBULATORY_CARE_PROVIDER_SITE_OTHER): Payer: BC Managed Care – PPO | Admitting: Family Medicine

## 2012-05-31 ENCOUNTER — Encounter: Payer: Self-pay | Admitting: Family Medicine

## 2012-06-07 ENCOUNTER — Encounter: Payer: Self-pay | Admitting: Family Medicine

## 2012-06-21 NOTE — Progress Notes (Signed)
Patient was not seen by MD today.  She needed to leave prior to seeing physician for visit

## 2012-08-31 ENCOUNTER — Ambulatory Visit (INDEPENDENT_AMBULATORY_CARE_PROVIDER_SITE_OTHER): Payer: BC Managed Care – PPO | Admitting: Family Medicine

## 2012-08-31 ENCOUNTER — Encounter: Payer: Self-pay | Admitting: Family Medicine

## 2012-08-31 VITALS — BP 130/90 | HR 88 | Temp 98.0°F | Ht 65.75 in | Wt 251.4 lb

## 2012-08-31 DIAGNOSIS — E785 Hyperlipidemia, unspecified: Secondary | ICD-10-CM

## 2012-08-31 DIAGNOSIS — F3281 Premenstrual dysphoric disorder: Secondary | ICD-10-CM

## 2012-08-31 DIAGNOSIS — I10 Essential (primary) hypertension: Secondary | ICD-10-CM

## 2012-08-31 DIAGNOSIS — E119 Type 2 diabetes mellitus without complications: Secondary | ICD-10-CM

## 2012-08-31 DIAGNOSIS — N943 Premenstrual tension syndrome: Secondary | ICD-10-CM

## 2012-08-31 HISTORY — DX: Essential (primary) hypertension: I10

## 2012-08-31 HISTORY — DX: Premenstrual dysphoric disorder: F32.81

## 2012-08-31 LAB — CBC WITH DIFFERENTIAL/PLATELET
Basophils Absolute: 0 10*3/uL (ref 0.0–0.1)
Eosinophils Absolute: 0.1 10*3/uL (ref 0.0–0.7)
Eosinophils Relative: 0.6 % (ref 0.0–5.0)
HCT: 40 % (ref 36.0–46.0)
Lymphs Abs: 2.2 10*3/uL (ref 0.7–4.0)
MCV: 86.9 fl (ref 78.0–100.0)
Monocytes Absolute: 0.5 10*3/uL (ref 0.1–1.0)
Neutrophils Relative %: 67.5 % (ref 43.0–77.0)
Platelets: 387 10*3/uL (ref 150.0–400.0)
RDW: 12.3 % (ref 11.5–14.6)
WBC: 8.5 10*3/uL (ref 4.5–10.5)

## 2012-08-31 LAB — HEPATIC FUNCTION PANEL
Alkaline Phosphatase: 77 U/L (ref 39–117)
Bilirubin, Direct: 0 mg/dL (ref 0.0–0.3)
Total Bilirubin: 0.7 mg/dL (ref 0.3–1.2)
Total Protein: 8.1 g/dL (ref 6.0–8.3)

## 2012-08-31 LAB — LIPID PANEL
Cholesterol: 276 mg/dL — ABNORMAL HIGH (ref 0–200)
HDL: 41.1 mg/dL (ref >39.00)
Total CHOL/HDL Ratio: 7
Triglycerides: 346 mg/dL — ABNORMAL HIGH (ref 0.0–149.0)
VLDL: 69.2 mg/dL — ABNORMAL HIGH (ref 0.0–40.0)

## 2012-08-31 LAB — BASIC METABOLIC PANEL
Calcium: 9.3 mg/dL (ref 8.4–10.5)
GFR: 104.15 mL/min (ref >60.00)
Potassium: 4.2 mEq/L (ref 3.5–5.1)
Sodium: 134 mEq/L — ABNORMAL LOW (ref 135–145)

## 2012-08-31 LAB — HEMOGLOBIN A1C: Hgb A1c MFr Bld: 10.8 % — ABNORMAL HIGH (ref 4.6–6.5)

## 2012-08-31 NOTE — Patient Instructions (Addendum)
Follow up in 3 months to recheck diabetes We'll notify you of your lab results and make changes based on the A1C Keep up the good work on the weight loss- that's fantastic!!! Call with any questions or concerns Welcome!  We're glad to have you!! Happy [birth year] Birthday!!

## 2012-08-31 NOTE — Assessment & Plan Note (Signed)
New to provider, chronic for pt.  Has had difficulty w/ controlling CBGs due to medication intolerances (hives w/ Lantus, intolerance to Januvia).  UTD on eye exam.  Currently asymptomatic.  Check labs and determine med regimen based on A1C.  Pt expressed understanding and is in agreement w/ plan.

## 2012-08-31 NOTE — Assessment & Plan Note (Signed)
New to provider, chronic for pt.  BP adequately controlled today.  Asymptomatic.  Check labs.  No anticipated changes.

## 2012-08-31 NOTE — Progress Notes (Signed)
  Subjective:    Patient ID: Barbara Jacobs, female    DOB: 1970-09-15, 42 y.o.   MRN: [id number]  HPI New to establish.  Previous MD- Celene Skeen [facility])  Cards- Dr Aubery Lapping- Cherly Hensen, UTD on pap, mammo.  Hyperlipidemia- chronic problem, on Zetia and Lipitor.  Following w/ Dr Katrinka Blazing.  Labs last done in September.  Has not been taking meds regularly due to 'issues w/ my prescriptions'.  DM- chronic problem, was having difficulty controlling CBGs.  Last A1C was 'high'.  Started on Lantus- developed hives.  Was switched to Levemir over the phone and didn't feel comfortable w/ that so stopped med.  Has lost 40+ lbs since fall.  Taking 1000mg  Metformin and 10mg  glipizide daily.  Will have intermittent numbness of L foot.  No N/V/D.  HTN- chronic problem, on Lisinopril HCT daily.  No CP, SOB, + HAs, no visual changes, edema.  Depression- chronic problem, on Zoloft 150mg  , 'it doesn't work'.  Was initially started on this for PMDD.  Strong family hx of depression.  Reports being frequently tearful, sleep has improved somewhat.     Review of Systems For ROS see HPI     Objective:   Physical Exam  Vitals reviewed. Constitutional: She is oriented to person, place, and time. She appears well-developed and well-nourished. No distress.       obese  HENT:  Head: Normocephalic and atraumatic.  Eyes: Conjunctivae normal and EOM are normal. Pupils are equal, round, and reactive to light.  Neck: Normal range of motion. Neck supple. No thyromegaly present.  Cardiovascular: Normal rate, regular rhythm, normal heart sounds and intact distal pulses.   No murmur heard. Pulmonary/Chest: Effort normal and breath sounds normal. No respiratory distress.  Abdominal: Soft. She exhibits no distension. There is no tenderness.  Musculoskeletal: She exhibits no edema.  Lymphadenopathy:    She has no cervical adenopathy.  Neurological: She is alert and oriented to person, place, and time.  Skin: Skin is  warm and dry.  Psychiatric: She has a normal mood and affect. Her behavior is normal.          Assessment & Plan:

## 2012-08-31 NOTE — Assessment & Plan Note (Signed)
New to provider, ongoing for pt.  Currently being tx'd by GYN.  Discussed med changes but pt not currently interested.  Will follow.

## 2012-08-31 NOTE — Assessment & Plan Note (Signed)
New to provider, chronic for pt.  On both statin and Zetia.  Check labs.  Adjust meds prn.

## 2012-09-07 ENCOUNTER — Telehealth: Payer: Self-pay | Admitting: *Deleted

## 2012-09-07 MED ORDER — ROSUVASTATIN CALCIUM 20 MG PO TABS: 20.0000 mg | ORAL_TABLET | Freq: Every day | ORAL | Status: DC

## 2012-09-07 NOTE — Telephone Encounter (Signed)
Spoke with the pt and informed her of recent lab results and note.  Pt understood and agreed.  Appt made for Victoza teaching.//AB/CMA

## 2012-09-07 NOTE — Telephone Encounter (Signed)
Message copied by Verdie Shire on Tue Sep 07, 2012  4:47 PM ------      Message from: Sheliah Hatch      Created: Wed Sep 01, 2012  9:57 AM       Total cholesterol and LDL are both about 100 points higher than we would like.  Needs to stop lipitor and switch to Crestor 20mg  nightly.  Continue the Zetia.      A1C is 10.8, goal is 7.  Needs to schedule an appt to come in for Victoza teaching- 15 minute appt

## 2012-09-15 ENCOUNTER — Ambulatory Visit: Payer: BC Managed Care – PPO | Admitting: Family Medicine

## 2012-09-22 ENCOUNTER — Encounter: Payer: Self-pay | Admitting: Family Medicine

## 2012-09-22 ENCOUNTER — Ambulatory Visit (INDEPENDENT_AMBULATORY_CARE_PROVIDER_SITE_OTHER): Payer: BC Managed Care – PPO | Admitting: Family Medicine

## 2012-09-22 VITALS — BP 198/90 | HR 90 | Temp 98.3°F | Ht 65.75 in | Wt 259.4 lb

## 2012-09-22 DIAGNOSIS — I10 Essential (primary) hypertension: Secondary | ICD-10-CM

## 2012-09-22 DIAGNOSIS — E119 Type 2 diabetes mellitus without complications: Secondary | ICD-10-CM

## 2012-09-22 MED ORDER — LIRAGLUTIDE 18 MG/3ML ~~LOC~~ SOLN: 1.2000 mg | Freq: Every day | SUBCUTANEOUS | Status: DC

## 2012-09-22 NOTE — Assessment & Plan Note (Signed)
Deteriorated.  Pt's BP is ski high but pt feels this is situational.  Rather than adjust meds, pt to monitor BP at home and will call if BP is consistently higher than 140/90.  Will follow.

## 2012-09-22 NOTE — Assessment & Plan Note (Signed)
Chronic problem.  Pt to continue metformin, start victoza, d/c glipizide.  Reviewed proper injxn technique and pt gave herself 1st injxn in office.  Will follow.
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
## 2012-11-17 ENCOUNTER — Other Ambulatory Visit: Payer: Self-pay | Admitting: *Deleted

## 2012-11-17 MED ORDER — METFORMIN HCL 500 MG PO TABS: 500.0000 mg | ORAL_TABLET | Freq: Two times a day (BID) | ORAL | Status: DC

## 2012-11-17 NOTE — Telephone Encounter (Signed)
Rx sent left Pt detail message. 

## 2013-03-23 ENCOUNTER — Emergency Department (HOSPITAL_COMMUNITY)
Admission: EM | Admit: 2013-03-23 | Discharge: 2013-03-23 | Disposition: A | Discharge status: Home / Self Care | Payer: BC Managed Care – PPO | Attending: Emergency Medicine | Admitting: Emergency Medicine

## 2013-03-23 ENCOUNTER — Encounter (HOSPITAL_COMMUNITY): Payer: Self-pay | Admitting: Emergency Medicine

## 2013-03-23 DIAGNOSIS — E119 Type 2 diabetes mellitus without complications: Secondary | ICD-10-CM | POA: Insufficient documentation

## 2013-03-23 DIAGNOSIS — Z9861 Coronary angioplasty status: Secondary | ICD-10-CM | POA: Insufficient documentation

## 2013-03-23 DIAGNOSIS — I252 Old myocardial infarction: Secondary | ICD-10-CM | POA: Insufficient documentation

## 2013-03-23 DIAGNOSIS — Z862 Personal history of diseases of the blood and blood-forming organs and certain disorders involving the immune mechanism: Secondary | ICD-10-CM | POA: Insufficient documentation

## 2013-03-23 DIAGNOSIS — Z8639 Personal history of other endocrine, nutritional and metabolic disease: Secondary | ICD-10-CM | POA: Insufficient documentation

## 2013-03-23 DIAGNOSIS — R519 Headache, unspecified: Secondary | ICD-10-CM

## 2013-03-23 DIAGNOSIS — R51 Headache: Secondary | ICD-10-CM | POA: Insufficient documentation

## 2013-03-23 DIAGNOSIS — Z79899 Other long term (current) drug therapy: Secondary | ICD-10-CM | POA: Insufficient documentation

## 2013-03-23 DIAGNOSIS — I10 Essential (primary) hypertension: Secondary | ICD-10-CM | POA: Insufficient documentation

## 2013-03-23 DIAGNOSIS — I2510 Atherosclerotic heart disease of native coronary artery without angina pectoris: Secondary | ICD-10-CM | POA: Insufficient documentation

## 2013-03-23 HISTORY — DX: Atherosclerotic heart disease of native coronary artery without angina pectoris: I25.10

## 2013-03-23 LAB — POCT I-STAT, CHEM 8
BUN: 16 mg/dL (ref 6–23)
Calcium, Ion: 1.15 mmol/L (ref 1.12–1.23)
Chloride: 103 mEq/L (ref 96–112)
Glucose, Bld: 306 mg/dL — ABNORMAL HIGH (ref 70–99)
TCO2: 22 mmol/L (ref 0–100)

## 2013-03-23 MED ORDER — PROCHLORPERAZINE EDISYLATE 5 MG/ML IJ SOLN
10.0000 mg | Freq: Once | INTRAMUSCULAR | Status: AC
  Administered 2013-03-23: 10 mg via INTRAVENOUS
  Filled 2013-03-23: qty 2

## 2013-03-23 MED ORDER — SODIUM CHLORIDE 0.9 % IV BOLUS (SEPSIS)
1000.0000 mL | Freq: Once | INTRAVENOUS | Status: AC
  Administered 2013-03-23: 1000 mL via INTRAVENOUS

## 2013-03-23 MED ORDER — DIPHENHYDRAMINE HCL 50 MG/ML IJ SOLN
25.0000 mg | Freq: Once | INTRAMUSCULAR | Status: AC
  Administered 2013-03-23: 25 mg via INTRAVENOUS
  Filled 2013-03-23: qty 1

## 2013-03-23 MED ORDER — PROMETHAZINE HCL 25 MG PO TABS: 25.0000 mg | ORAL_TABLET | Freq: Four times a day (QID) | ORAL | Status: DC | PRN

## 2013-03-23 MED ORDER — DEXAMETHASONE SODIUM PHOSPHATE 10 MG/ML IJ SOLN
10.0000 mg | Freq: Once | INTRAMUSCULAR | Status: AC
  Administered 2013-03-23: 10 mg via INTRAVENOUS
  Filled 2013-03-23: qty 1

## 2013-03-23 NOTE — ED Provider Notes (Signed)
CSN: [id number]     Arrival date & time 03/23/13  0203 History     First MD Initiated Contact with Patient 03/23/13 0402     Chief Complaint  Patient presents with  . Headache   (Consider location/radiation/quality/duration/timing/severity/associated sxs/prior Treatment) HPI History provided by pt.   Pt has had a constant, R frontal headache w/ right-sided facial pain for the past 2 days.  No associated fever, dizziness, vision changes, photo/phonophobia, N/V.  Denies recent head trauma.  Had a similar headache 2 weeks ago that resolved spontaneously, but previous to that, she never really got headaches.  Past Medical History  Diagnosis Date  . Diabetes mellitus without complication   . Hypertension   . Myocardial infarction   . Hyperlipidemia   . Coronary artery disease    Past Surgical History  Procedure Laterality Date  . Coronary stent placement     Family History  Problem Relation Age of Onset  . Diabetes Mother   . Hypertension Mother   . Diabetes Father   . COPD Father   . Hypertension Father   . Hypertension Sister   . Kidney disease Sister   . Diabetes Maternal Grandmother   . Hypertension Maternal Grandmother   . Heart disease Maternal Grandmother   . Hypertension Paternal Grandmother   . Diabetes Paternal Grandfather    History  Substance Use Topics  . Smoking status: Never Smoker   . Smokeless tobacco: Not on file  . Alcohol Use: Yes     Comment: pt drinks twice a month   OB History   Grav Para Term Preterm Abortions TAB SAB Ect Mult Living                 Review of Systems  All other systems reviewed and are negative.    Allergies  Ibuprofen; Naproxen; and Shellfish allergy  Home Medications   Current Outpatient Rx  Name  Route  Sig  Dispense  Refill  . ezetimibe (ZETIA) 10 MG tablet   Oral   Take 10 mg by mouth daily.         . fish oil-omega-3 fatty acids 1000 MG capsule   Oral   Take 2 g by mouth daily.         . Liraglutide  (VICTOZA) 18 MG/3ML SOLN injection   Subcutaneous   Inject 0.2 mLs (1.2 mg total) into the skin daily.   6 mg   6   . lisinopril-hydrochlorothiazide (PRINZIDE,ZESTORETIC) 20-25 MG per tablet   Oral   Take 1 tablet by mouth daily.         . metFORMIN (GLUCOPHAGE) 500 MG tablet   Oral   Take 1 tablet (500 mg total) by mouth 2 (two) times daily with a meal.   60 tablet   1   . Multiple Vitamins-Minerals (MULTIVITAMIN WITH MINERALS) tablet   Oral   Take 1 tablet by mouth daily.         . sertraline (ZOLOFT) 100 MG tablet   Oral   Take 150 mg by mouth daily.          BP 152/89  Pulse 80  Temp(Src) 98 F (36.7 C) (Oral)  Resp 14  SpO2 98%  LMP 03/12/2013 Physical Exam  Nursing note and vitals reviewed. Constitutional: She is oriented to person, place, and time. She appears well-developed and well-nourished.  HENT:  Head: Normocephalic and atraumatic.  Tenderness R temple and right maxillary and ethmoid sinuses w/ guarding.  No jaw claudication.  Eyes:  Normal appearance  Neck: Normal range of motion. Neck supple. No rigidity. No Brudzinski's sign and no Kernig's sign noted.  Cardiovascular: Normal rate, regular rhythm and intact distal pulses.   Pulmonary/Chest: Effort normal and breath sounds normal.  Musculoskeletal: Normal range of motion.  Neurological: She is alert and oriented to person, place, and time. No sensory deficit. Coordination normal.  CN 3-12 intact.  No nystagmus.  5/5 and equal upper and lower extremity strength.  No past pointing.    Skin: Skin is warm and dry. No rash noted.  Psychiatric: She has a normal mood and affect. Her behavior is normal.    ED Course   Procedures (including critical care time)  Labs Reviewed  GLUCOSE, CAPILLARY - Abnormal; Notable for the following:    Glucose-Capillary 318 (*)    All other components within normal limits  POCT I-STAT, CHEM 8 - Abnormal; Notable for the following:    Glucose, Bld 306 (*)     All other components within normal limits   No results found. 1. Headache     MDM  42yo F w/ HTN and diabetes presents w/ gradual onset, non-traumatic R-sided headache and facial pain.  On exam, afebrile, non-toxic appearance, no focal neuro deficits, no meningeal signs, R temporal and maxillary/ethmoid sinus ttp, no jaw claudication.  Will treat symptomatically w/ IVF, compazine and decadron and recommend coricidin for possible sinusitis at home at time of discharge.  4:13 AM   Pt developed a mild dystonic reaction to compazine.  Resolved w/ 25mg  IV benadryl. Nursing staff checked cbg and it was 318.  She is not acidotic.  Prescribed phenergan to be taken w/ benadryl at home for headache.  Referred to neuro for recurrent sx.  Return precautions discussed. 5:58 AM   Otilio Miu, PA-C 03/23/13 [phone]

## 2013-03-23 NOTE — ED Notes (Signed)
PT. REPORTS HEADACHE AND RIGHT FACIAL PAIN FOR 3 DAYS , DENIES NAUSEA OR VOMITTING . DENIES INJURY. NO FEVER OR CHILLS.

## 2013-03-23 NOTE — ED Notes (Signed)
Patient stated "I feel like I'm dying", patient complaints of pain on right side of face from bridge of nose to back of head midline, no changes in vision, no history of migraines or recent trauma

## 2013-03-24 NOTE — ED Provider Notes (Signed)
Medical screening examination/treatment/procedure(s) were performed by non-physician practitioner and as supervising physician I was immediately available for consultation/collaboration.   Charles B. Bernette Mayers, MD 03/24/13 1135

## 2013-05-12 ENCOUNTER — Other Ambulatory Visit: Payer: Self-pay

## 2013-05-12 DIAGNOSIS — Z1231 Encounter for screening mammogram for malignant neoplasm of breast: Secondary | ICD-10-CM

## 2013-06-03 ENCOUNTER — Ambulatory Visit
Admission: RE | Admit: 2013-06-03 | Discharge: 2013-06-03 | Disposition: A | Discharge status: Home / Self Care | Payer: BC Managed Care – PPO | Source: Ambulatory Visit

## 2013-06-03 DIAGNOSIS — Z1231 Encounter for screening mammogram for malignant neoplasm of breast: Secondary | ICD-10-CM

## 2013-11-02 LAB — HM DIABETES EYE EXAM

## 2014-04-14 ENCOUNTER — Inpatient Hospital Stay (HOSPITAL_COMMUNITY)
Admission: EM | Admit: 2014-04-14 | Discharge: 2014-04-18 | DRG: 281 | Disposition: A | Discharge status: Home / Self Care | Payer: BC Managed Care – PPO | Attending: Cardiology | Admitting: Cardiology

## 2014-04-14 ENCOUNTER — Encounter (HOSPITAL_COMMUNITY): Payer: Self-pay | Admitting: Emergency Medicine

## 2014-04-14 ENCOUNTER — Emergency Department (HOSPITAL_COMMUNITY): Payer: BC Managed Care – PPO

## 2014-04-14 DIAGNOSIS — Z91013 Allergy to seafood: Secondary | ICD-10-CM | POA: Diagnosis not present

## 2014-04-14 DIAGNOSIS — I10 Essential (primary) hypertension: Secondary | ICD-10-CM | POA: Diagnosis present

## 2014-04-14 DIAGNOSIS — E781 Pure hyperglyceridemia: Secondary | ICD-10-CM | POA: Diagnosis present

## 2014-04-14 DIAGNOSIS — Z91018 Allergy to other foods: Secondary | ICD-10-CM

## 2014-04-14 DIAGNOSIS — Z6841 Body Mass Index (BMI) 40.0 and over, adult: Secondary | ICD-10-CM

## 2014-04-14 DIAGNOSIS — Z886 Allergy status to analgesic agent status: Secondary | ICD-10-CM

## 2014-04-14 DIAGNOSIS — Z8249 Family history of ischemic heart disease and other diseases of the circulatory system: Secondary | ICD-10-CM | POA: Diagnosis not present

## 2014-04-14 DIAGNOSIS — I214 Non-ST elevation (NSTEMI) myocardial infarction: Secondary | ICD-10-CM | POA: Diagnosis present

## 2014-04-14 DIAGNOSIS — Z833 Family history of diabetes mellitus: Secondary | ICD-10-CM

## 2014-04-14 DIAGNOSIS — I252 Old myocardial infarction: Secondary | ICD-10-CM

## 2014-04-14 DIAGNOSIS — IMO0001 Reserved for inherently not codable concepts without codable children: Secondary | ICD-10-CM | POA: Diagnosis present

## 2014-04-14 DIAGNOSIS — Z91199 Patient's noncompliance with other medical treatment and regimen due to unspecified reason: Secondary | ICD-10-CM | POA: Diagnosis not present

## 2014-04-14 DIAGNOSIS — R079 Chest pain, unspecified: Secondary | ICD-10-CM | POA: Diagnosis present

## 2014-04-14 DIAGNOSIS — Z9861 Coronary angioplasty status: Secondary | ICD-10-CM

## 2014-04-14 DIAGNOSIS — Z9119 Patient's noncompliance with other medical treatment and regimen: Secondary | ICD-10-CM

## 2014-04-14 DIAGNOSIS — I2510 Atherosclerotic heart disease of native coronary artery without angina pectoris: Secondary | ICD-10-CM | POA: Diagnosis present

## 2014-04-14 DIAGNOSIS — E785 Hyperlipidemia, unspecified: Secondary | ICD-10-CM

## 2014-04-14 DIAGNOSIS — E0859 Diabetes mellitus due to underlying condition with other circulatory complications: Secondary | ICD-10-CM

## 2014-04-14 DIAGNOSIS — E1165 Type 2 diabetes mellitus with hyperglycemia: Secondary | ICD-10-CM

## 2014-04-14 LAB — CBC WITH DIFFERENTIAL/PLATELET
BASOS PCT: 0 % (ref 0–1)
Basophils Absolute: 0 10*3/uL (ref 0.0–0.1)
EOS ABS: 0 10*3/uL (ref 0.0–0.7)
EOS PCT: 0 % (ref 0–5)
HCT: 39.6 % (ref 36.0–46.0)
Hemoglobin: 13.3 g/dL (ref 12.0–15.0)
Lymphocytes Relative: 25 % (ref 12–46)
Lymphs Abs: 2.6 10*3/uL (ref 0.7–4.0)
MCH: 30 pg (ref 26.0–34.0)
MCHC: 33.6 g/dL (ref 30.0–36.0)
MCV: 89.2 fL (ref 78.0–100.0)
Monocytes Absolute: 0.6 10*3/uL (ref 0.1–1.0)
Monocytes Relative: 6 % (ref 3–12)
NEUTROS PCT: 69 % (ref 43–77)
Neutro Abs: 7.4 10*3/uL (ref 1.7–7.7)
PLATELETS: 348 10*3/uL (ref 150–400)
RBC: 4.44 MIL/uL (ref 3.87–5.11)
RDW: 12.3 % (ref 11.5–15.5)
WBC: 10.6 10*3/uL — ABNORMAL HIGH (ref 4.0–10.5)

## 2014-04-14 LAB — BASIC METABOLIC PANEL
ANION GAP: 16 — AB (ref 5–15)
BUN: 16 mg/dL (ref 6–23)
CHLORIDE: 96 meq/L (ref 96–112)
CO2: 22 mEq/L (ref 19–32)
Calcium: 9.6 mg/dL (ref 8.4–10.5)
Creatinine, Ser: 0.65 mg/dL (ref 0.50–1.10)
GFR calc non Af Amer: >90 mL/min (ref >90)
Glucose, Bld: 333 mg/dL — ABNORMAL HIGH (ref 70–99)
POTASSIUM: 4.6 meq/L (ref 3.7–5.3)
SODIUM: 134 meq/L — AB (ref 137–147)

## 2014-04-14 LAB — PRO B NATRIURETIC PEPTIDE: Pro B Natriuretic peptide (BNP): 234.8 pg/mL — ABNORMAL HIGH (ref 0–125)

## 2014-04-14 LAB — TROPONIN I: TROPONIN I: 1.05 ng/mL — AB (ref <0.30)

## 2014-04-14 LAB — I-STAT TROPONIN, ED: TROPONIN I, POC: 0.53 ng/mL — AB (ref 0.00–0.08)

## 2014-04-14 MED ORDER — ADULT MULTIVITAMIN W/MINERALS CH
1.0000 | ORAL_TABLET | Freq: Every day | ORAL | Status: DC
  Administered 2014-04-15 – 2014-04-18 (×4): 1 via ORAL
  Filled 2014-04-14 (×4): qty 1

## 2014-04-14 MED ORDER — LISINOPRIL 10 MG PO TABS
10.0000 mg | ORAL_TABLET | Freq: Every day | ORAL | Status: DC
  Filled 2014-04-14: qty 1

## 2014-04-14 MED ORDER — METOPROLOL TARTRATE 25 MG PO TABS
25.0000 mg | ORAL_TABLET | Freq: Two times a day (BID) | ORAL | Status: DC
  Administered 2014-04-15: 25 mg via ORAL
  Filled 2014-04-14 (×3): qty 1

## 2014-04-14 MED ORDER — SERTRALINE HCL 50 MG PO TABS
150.0000 mg | ORAL_TABLET | Freq: Every day | ORAL | Status: DC
  Administered 2014-04-15 – 2014-04-18 (×4): 150 mg via ORAL
  Filled 2014-04-14 (×4): qty 1

## 2014-04-14 MED ORDER — HEPARIN BOLUS VIA INFUSION
4000.0000 [IU] | Freq: Once | INTRAVENOUS | Status: AC
  Administered 2014-04-14: 4000 [IU] via INTRAVENOUS
  Filled 2014-04-14: qty 4000

## 2014-04-14 MED ORDER — NITROGLYCERIN 0.4 MG SL SUBL: 0.4000 mg | SUBLINGUAL_TABLET | SUBLINGUAL | Status: DC | PRN

## 2014-04-14 MED ORDER — ASPIRIN 81 MG PO CHEW
324.0000 mg | CHEWABLE_TABLET | Freq: Once | ORAL | Status: AC
  Administered 2014-04-14: 324 mg via ORAL
  Filled 2014-04-14: qty 4

## 2014-04-14 MED ORDER — ATORVASTATIN CALCIUM 80 MG PO TABS
80.0000 mg | ORAL_TABLET | Freq: Every day | ORAL | Status: DC
  Administered 2014-04-15 – 2014-04-17 (×4): 80 mg via ORAL
  Filled 2014-04-14 (×5): qty 1

## 2014-04-14 MED ORDER — ASPIRIN EC 81 MG PO TBEC
81.0000 mg | DELAYED_RELEASE_TABLET | Freq: Every day | ORAL | Status: DC
  Administered 2014-04-15 – 2014-04-17 (×3): 81 mg via ORAL
  Filled 2014-04-14 (×4): qty 1

## 2014-04-14 MED ORDER — ACETAMINOPHEN 325 MG PO TABS
650.0000 mg | ORAL_TABLET | ORAL | Status: DC | PRN
  Administered 2014-04-15: 650 mg via ORAL
  Filled 2014-04-14: qty 2

## 2014-04-14 MED ORDER — SODIUM CHLORIDE 0.9 % IV SOLN
INTRAVENOUS | Status: AC
  Administered 2014-04-15: 01:00:00 via INTRAVENOUS

## 2014-04-14 MED ORDER — HEPARIN (PORCINE) IN NACL 100-0.45 UNIT/ML-% IJ SOLN
1700.0000 [IU]/h | INTRAMUSCULAR | Status: DC
  Administered 2014-04-14: 1000 [IU]/h via INTRAVENOUS
  Administered 2014-04-15: 1600 [IU]/h via INTRAVENOUS
  Administered 2014-04-16: 1700 [IU]/h via INTRAVENOUS
  Administered 2014-04-16: 1800 [IU]/h via INTRAVENOUS
  Filled 2014-04-14 (×5): qty 250

## 2014-04-14 MED ORDER — ONDANSETRON HCL 4 MG/2ML IJ SOLN: 4.0000 mg | Freq: Four times a day (QID) | INTRAMUSCULAR | Status: DC | PRN

## 2014-04-14 MED ORDER — OMEGA-3-ACID ETHYL ESTERS 1 G PO CAPS
1.0000 g | ORAL_CAPSULE | Freq: Every day | ORAL | Status: DC
  Administered 2014-04-15 – 2014-04-18 (×3): 1 g via ORAL
  Filled 2014-04-14 (×4): qty 1

## 2014-04-14 MED ORDER — NITROGLYCERIN 0.4 MG SL SUBL
0.4000 mg | SUBLINGUAL_TABLET | SUBLINGUAL | Status: DC | PRN
  Administered 2014-04-14: 0.4 mg via SUBLINGUAL
  Filled 2014-04-14: qty 1

## 2014-04-14 NOTE — ED Notes (Signed)
Pt reports itching to her lips and roof of her mouth. Pt states "I think I'm allergic to the ASA and nitro."

## 2014-04-14 NOTE — ED Notes (Signed)
Report received to [facility] via Leslie,RN; pt moved to [employer]

## 2014-04-14 NOTE — H&P (Signed)
Cardiology History and Physical  PCP: Annye Asa, MD Cardiologist: Dr. Tamala Julian  History of Present Illness (and review of medical records): Barbara Jacobs is a 43 y.o. female who presents for evaluation of chest pain.  She has known hx of CAD, s/p Acute inferior STEMI in 03/2009.  She had BMS to proximal PDA.  She has HTN, DM, dyslipidemia and obesity.  She has not had any recent evaluation.  She states has been going through a lot of stress recently and non compliant with medications.  She states that chest pain started yesterday evening, but did not really pay attention to symptoms.  She thought it was due to carrying heavy bags on her left side.  However today around 4pm, the pain returned and she was not carrying anything heavy.  Pain was left sided and radiated to left arm.  Pain was 6-7/10.  She denies any associated shortness of breath, diaphoresis, or palpitations.  She was seen in the ED and given ASA and NTG.  She is now chest pain free.  Previous diagnostic testing for coronary artery disease includes: cardiac catheterization. Previous history of cardiac disease includes Angina Coronary Artery Disease Coronary Artery Stent MI. Coronary artery disease risk factors include: diabetes mellitus, dyslipidemia, hypertension and obesity (BMI >= 30 kg/m2).  Patient denies history of arrhythmia, CABG, cardiomyopathy and valvular disease.  Review of Systems A comprehensive review of systems was negative except for: Ears, nose, mouth, throat, and face: positive for facial pain Neurological: positive for dizziness and headaches  Patient Active Problem List   Diagnosis Date Noted  . NSTEMI (non-ST elevated myocardial infarction) 04/14/2014  . Hyperlipidemia 08/31/2012  . DM (diabetes mellitus) 08/31/2012  . HTN (hypertension) 08/31/2012  . PMDD (premenstrual dysphoric disorder) 08/31/2012   Past Medical History  Diagnosis Date  . Diabetes mellitus without complication   . Hypertension    . Myocardial infarction   . Hyperlipidemia   . Coronary artery disease     Past Surgical History  Procedure Laterality Date  . Coronary stent placement       (Not in a hospital admission) Allergies  Allergen Reactions  . Ibuprofen Swelling    Throat swells shut  . Naproxen     Swell up and throat swells shut  . Pineapple   . Shellfish Allergy Hives, Itching and Swelling    History  Substance Use Topics  . Smoking status: Never Smoker   . Smokeless tobacco: Never Used  . Alcohol Use: Yes     Comment: pt drinks twice a month    Family History  Problem Relation Age of Onset  . Diabetes Mother   . Hypertension Mother   . Diabetes Father   . COPD Father   . Hypertension Father   . Hypertension Sister   . Kidney disease Sister   . Diabetes Maternal Grandmother   . Hypertension Maternal Grandmother   . Heart disease Maternal Grandmother   . Hypertension Paternal Grandmother   . Diabetes Paternal Grandfather      Objective:  Patient Vitals for the past 8 hrs:  BP Temp Temp src Pulse Resp SpO2 Height Weight  04/14/14 2215 146/86 mmHg - - 88 20 99 % - -  04/14/14 2200 174/100 mmHg - - 89 19 99 % - -  04/14/14 2130 172/86 mmHg - - 103 37 100 % - -  04/14/14 2115 163/95 mmHg - - 87 23 98 % - -  04/14/14 2045 107/89 mmHg - - 91 23 98 % - -  04/14/14 1945 178/108 mmHg - - 83 17 99 % - -  04/14/14 1930 175/95 mmHg - - 88 16 98 % - -  04/14/14 1915 144/71 mmHg - - 89 19 99 % - -  04/14/14 1900 149/93 mmHg - - 87 12 99 % _0  (1.676 m) 113.399 kg (250 lb)  04/14/14 1847 158/100 mmHg - - 83 21 99 % - -  04/14/14 1713 181/110 mmHg 98.3 F (36.8 C) Oral 126 22 99 % - -   General appearance: alert, cooperative, appears stated age and no distress Head: Normocephalic, without obvious abnormality, atraumatic Eyes: conjunctivae/corneas clear. PERRL, EOM's intact. Neck: no carotid bruit, no JVD and supple, Lungs: clear to auscultation bilaterally Chest wall: no  tenderness Heart: regular rate and rhythm, S1, S2 normal, no murmur, click, rub or gallop Abdomen: soft, non-tender; bowel sounds normal Extremities: extremities normal, atraumatic, no cyanosis or edema Pulses: 2+ and symmetric Neurologic: Grossly normal  Results for orders placed during the hospital encounter of 04/14/14 (from the past 48 hour(s))  BASIC METABOLIC PANEL     Status: Abnormal   Collection Time    04/14/14  5:45 PM      Result Value Ref Range   Sodium 134 (*) 137 - 147 mEq/L   Potassium 4.6  3.7 - 5.3 mEq/L   Chloride 96  96 - 112 mEq/L   CO2 22  19 - 32 mEq/L   Glucose, Bld 333 (*) 70 - 99 mg/dL   BUN 16  6 - 23 mg/dL   Creatinine, Ser 0.65  0.50 - 1.10 mg/dL   Calcium 9.6  8.4 - 10.5 mg/dL   GFR calc non Af Amer >90  >90 mL/min   GFR calc Af Amer >90  >90 mL/min   Comment: (NOTE)     The eGFR has been calculated using the CKD EPI equation.     This calculation has not been validated in all clinical situations.     eGFR's persistently <90 mL/min signify possible Chronic Kidney     Disease.   Anion gap 16 (*) 5 - 15  CBC WITH DIFFERENTIAL     Status: Abnormal   Collection Time    04/14/14  5:45 PM      Result Value Ref Range   WBC 10.6 (*) 4.0 - 10.5 K/uL   RBC 4.44  3.87 - 5.11 MIL/uL   Hemoglobin 13.3  12.0 - 15.0 g/dL   HCT 39.6  36.0 - 46.0 %   MCV 89.2  78.0 - 100.0 fL   MCH 30.0  26.0 - 34.0 pg   MCHC 33.6  30.0 - 36.0 g/dL   RDW 12.3  11.5 - 15.5 %   Platelets 348  150 - 400 K/uL   Neutrophils Relative % 69  43 - 77 %   Neutro Abs 7.4  1.7 - 7.7 K/uL   Lymphocytes Relative 25  12 - 46 %   Lymphs Abs 2.6  0.7 - 4.0 K/uL   Monocytes Relative 6  3 - 12 %   Monocytes Absolute 0.6  0.1 - 1.0 K/uL   Eosinophils Relative 0  0 - 5 %   Eosinophils Absolute 0.0  0.0 - 0.7 K/uL   Basophils Relative 0  0 - 1 %   Basophils Absolute 0.0  0.0 - 0.1 K/uL  PRO B NATRIURETIC PEPTIDE     Status: Abnormal   Collection Time    04/14/14  5:47 PM  Result Value  Ref Range   Pro B Natriuretic peptide (BNP) 234.8 (*) 0 - 125 pg/mL  I-STAT TROPOININ, ED     Status: Abnormal   Collection Time    04/14/14  5:48 PM      Result Value Ref Range   Troponin i, poc 0.53 (*) 0.00 - 0.08 ng/mL   Comment NOTIFIED PHYSICIAN     Comment 3            Comment: Due to the release kinetics of cTnI,     a negative result within the first hours     of the onset of symptoms does not rule out     myocardial infarction with certainty.     If myocardial infarction is still suspected,     repeat the test at appropriate intervals.  TROPONIN I     Status: Abnormal   Collection Time    04/14/14  6:11 PM      Result Value Ref Range   Troponin I 1.05 (*) <0.30 ng/mL   Comment:            Due to the release kinetics of cTnI,     a negative result within the first hours     of the onset of symptoms does not rule out     myocardial infarction with certainty.     If myocardial infarction is still suspected,     repeat the test at appropriate intervals.     CRITICAL RESULT CALLED TO, READ BACK BY AND VERIFIED WITH:     L SHULAR,RN 1915 04/14/14 D BRADLEY   Dg Chest 2 View  04/14/2014   CLINICAL DATA:  Chest pain.  EXAM: CHEST  2 VIEW  COMPARISON:  Chest x-ray 04/14/2014.  FINDINGS: Lung volumes are normal. No consolidative airspace disease. No pleural effusions. No pneumothorax. No pulmonary nodule or mass noted. Pulmonary vasculature and the cardiomediastinal silhouette are within normal limits.  IMPRESSION: 1. No radiographic evidence of acute cardiopulmonary disease. 2. The previously noted lucency in the medial aspect of the lower left hemithorax has completely resolved on the standing PA and lateral views, indicative of an imaging artifact related to projection on the prior study. No findings to suggest pneumomediastinum at this time.   Electronically Signed   By: Vinnie Langton M.D.   On: 04/14/2014 20:13   Dg Chest Portable 1 View  04/14/2014   CLINICAL DATA:  Chest  pain.  EXAM: PORTABLE CHEST - 1 VIEW  COMPARISON:  Chest x-ray 03/07/2009.  FINDINGS: Lung volumes are normal. No consolidative airspace disease. No pleural effusions. No pneumothorax. No pulmonary nodule or mass noted. Pulmonary vasculature and the cardiomediastinal silhouette are within normal limits, although there is an unusual lucency in the a lower aspect of the left hemithorax prominently all lying the distal descending thoracic aorta.  IMPRESSION: Unusual lucency in the medial aspect of the lower left hemithorax. This may simply be projectional (which is favored), however, if there is any clinical concern for pneumomediastinum, repeat standing PA and lateral chest x-ray is recommended at this time, as these findings could be seen in the setting of pneumomediastinum.   Electronically Signed   By: Vinnie Langton M.D.   On: 04/14/2014 19:34    ECG:  2201, reviewed. Sinus rhythm HR 87, diffuse nonspecific T wave abnormality, probable old inferior infarct.  No prior ecgs at this time for review.  Assessment: Patient Active Problem List   Diagnosis Date Noted  . NSTEMI (non-ST  elevated myocardial infarction) 04/14/2014  . Hyperlipidemia 08/31/2012  . DM (diabetes mellitus) 08/31/2012  . HTN (hypertension) 08/31/2012   Plan: 1. Cardiology Admission  2. Continuous monitoring on Telemetry. 3. Repeat ekg on admit, prn chest pain or arrythmia 4. Trend cardiac biomarkers, check lipids, hgba1c, tsh 5. Medical management to include ASA, Heparin gtt, BB, ACEi, Statin, NTG prn. 6. Gentle IVFs 7. Discussed likely need for invasive ischemic assessment with cardiac catheterization.

## 2014-04-14 NOTE — ED Notes (Signed)
The patient said she started haivng chest pain yesterday and it radiates to her back and her neck and "just everywhere".  She also says she has been having a headache as well.  The patient has had a heart attack in 2010 and said she wants to make sure she is not having another one.  She says it does not feel the same as when she was having a heart attack, she felt indigestion then.  She is under a lot of stress at work and thinks it might be muscle aches.

## 2014-04-14 NOTE — Progress Notes (Signed)
56 250  ANTICOAGULATION CONSULT NOTE - Initial Consult  Pharmacy Cons Heparin Indication: chest pain/ACS  Allergies  Allergen Reactions  . Ibuprofen Swelling    Throat swells shut  . Naproxen     Swell up and throat swells shut  . Pineapple   . Shellfish Allergy Hives, Itching and Swelling    Patient Measurements: Height:  (167.6 cm) Weight: 250 lb (113.399 kg) IBW/kg (Calculated) : 59.3 x1.25 = 73kg Heparin Dosing Weight: 85kg  Vital Signs: Temp: 98.3 F (36.8 C) (09/11 1713) Temp src: Oral (09/11 1713) BP: 158/100 mmHg (09/11 1847) Pulse Rate: 83 (09/11 1847)  Labs:  Recent Labs  04/14/14 1745 04/14/14 1811  HGB 13.3  --   HCT 39.6  --   PLT 348  --   CREATININE 0.65  --   TROPONINI  --  1.05*    The CrCl is unknown because both a height and weight (above a minimum accepted value) are required for this calculation.   Medical History: Past Medical History  Diagnosis Date  . Diabetes mellitus without complication   . Hypertension   . Myocardial infarction   . Hyperlipidemia   . Coronary artery disease     Medications:   (Not in a hospital admission)  Assessment: 43 yo F admitted 04/14/2014 with a headache and evolving CP found to have a positive troponin.  Pharmacy consulted to dose heparin.  PMH: CAD with MI, DM, HTN, hyperlipidemia  Coag/Heme: ACS, patient denies recent bleeding.    Goal of Therapy:  Heparin level 0.3-0.7 units/ml Monitor platelets by anticoagulation protocol: Yes   Plan:  1. Heparin 4000 units IV x 1 then 1000 units/h 2. Follow up 6h heparin level 3. Daily heparin level and CBC.  Thank you for allowing pharmacy to be a part of this patients care team.  Lovenia Kim Pharm.D., BCPS, AQ-Cardiology Clinical Pharmacist 04/14/2014 7:38 PM Pager: [phone] Phone: [phone]

## 2014-04-14 NOTE — ED Notes (Addendum)
Patient transported to X-ray. As pt was going to XR she states "Yall aint sticking me again."

## 2014-04-14 NOTE — ED Provider Notes (Signed)
CSN: [id number]     Arrival date & time 04/14/14  1654 History   First MD Initiated Contact with Patient 04/14/14 1810     Chief Complaint  Patient presents with  . Chest Pain    The patient said she started haivng chest pain yesterday and it radiates to her back and her neck and "just everywhere".  She also says she has been having a headache as well.      Patient is a 43 y.o. female presenting with chest pain. The history is provided by the patient.  Chest Pain Pain location:  L chest Pain quality: pressure   Pain radiates to:  Neck and upper back Pain severity:  Moderate Onset quality:  Gradual Duration:  1 day Timing:  Intermittent Progression:  Improving Chronicity:  New Relieved by:  None tried Worsened by:  Nothing tried Associated symptoms: dizziness   Associated symptoms: no fever, no shortness of breath and no syncope   Risk factors: coronary artery disease   Pt reports intermittent episodes of CP since yesterday It is now essentially resolved She reports some associated dizziness No SOB is reported  She reports h/o CAD and stent placement previously in 2010   Past Medical History  Diagnosis Date  . Diabetes mellitus without complication   . Hypertension   . Myocardial infarction   . Hyperlipidemia   . Coronary artery disease    Past Surgical History  Procedure Laterality Date  . Coronary stent placement     Family History  Problem Relation Age of Onset  . Diabetes Mother   . Hypertension Mother   . Diabetes Father   . COPD Father   . Hypertension Father   . Hypertension Sister   . Kidney disease Sister   . Diabetes Maternal Grandmother   . Hypertension Maternal Grandmother   . Heart disease Maternal Grandmother   . Hypertension Paternal Grandmother   . Diabetes Paternal Grandfather    History  Substance Use Topics  . Smoking status: Never Smoker   . Smokeless tobacco: Never Used  . Alcohol Use: Yes     Comment: pt drinks twice a month   OB  History   Grav Para Term Preterm Abortions TAB SAB Ect Mult Living                 Review of Systems  Constitutional: Negative for fever.  Respiratory: Negative for shortness of breath.   Cardiovascular: Positive for chest pain. Negative for syncope.  Gastrointestinal: Negative for blood in stool.  Genitourinary: Negative for vaginal bleeding.  Neurological: Positive for dizziness. Negative for syncope.  All other systems reviewed and are negative.     Allergies  Ibuprofen; Naproxen; Pineapple; and Shellfish allergy  Home Medications   Prior to Admission medications   Medication Sig Start Date End Date Taking? Authorizing Provider  ezetimibe (ZETIA) 10 MG tablet Take 10 mg by mouth daily.    Historical Provider, MD  fish oil-omega-3 fatty acids 1000 MG capsule Take 2 g by mouth daily.    Historical Provider, MD  Liraglutide (VICTOZA) 18 MG/3ML SOLN injection Inject 0.2 mLs (1.2 mg total) into the skin daily. 09/22/12   Sheliah Hatch, MD  lisinopril-hydrochlorothiazide (PRINZIDE,ZESTORETIC) 20-25 MG per tablet Take 1 tablet by mouth daily.    Historical Provider, MD  metFORMIN (GLUCOPHAGE) 500 MG tablet Take 1 tablet (500 mg total) by mouth 2 (two) times daily with a meal. 11/17/12   Sheliah Hatch, MD  Multiple Vitamins-Minerals (MULTIVITAMIN  WITH MINERALS) tablet Take 1 tablet by mouth daily.    Historical Provider, MD  promethazine (PHENERGAN) 25 MG tablet Take 1 tablet (25 mg total) by mouth every 6 (six) hours as needed for nausea. 03/23/13   Arie Sabina Schinlever, PA-C  sertraline (ZOLOFT) 100 MG tablet Take 150 mg by mouth daily.    Historical Provider, MD   BP 158/100  Pulse 83  Temp(Src) 98.3 F (36.8 C) (Oral)  Resp 21  SpO2 99%  LMP 03/24/2014 Physical Exam CONSTITUTIONAL: Well developed/well nourished HEAD: Normocephalic/atraumatic EYES: EOMI/PERRL ENMT: Mucous membranes moist NECK: supple no meningeal signs SPINE:entire spine nontender CV: S1/S2  noted, no murmurs/rubs/gallops noted LUNGS: Lungs are clear to auscultation bilaterally, no apparent distress ABDOMEN: soft, nontender, no rebound or guarding GU:no cva tenderness NEURO: Pt is awake/alert, moves all extremitiesx4 No facial droop.  Pt is conversant and in no distress EXTREMITIES: pulses normal, full ROM, no calf tenderness SKIN: warm, color normal PSYCH: no abnormalities of mood noted  ED Course  Procedures  CRITICAL CARE Performed by: Joya Gaskins Total critical care time: 32 Critical care time was exclusive of separately billable procedures and treating other patients. Critical care was necessary to treat or prevent imminent or life-threatening deterioration. Critical care was time spent personally by me on the following activities: development of treatment plan with patient and/or surrogate as well as nursing, discussions with consultants, evaluation of patient's response to treatment, examination of patient, obtaining history from patient or surrogate, ordering and performing treatments and interventions, ordering and review of laboratory studies, ordering and review of radiographic studies, pulse oximetry and re-evaluation of patient's condition. PATIENT RECEIVED ASA/NTG/HEPARIN DRIP WHILE IN THE ER 7:46 PM Pt with h/o CAD with episodes of CP over past day, now CP free EKG is unremarkable however her troponin is elevated ASA/NTG has been given Per radiology recs will repeat CXR 9:05 PM Repeat CXR negative D/w cardiology, will admit for Non-stemi  BP 178/108  Pulse 83  Temp(Src) 98.3 F (36.8 C) (Oral)  Resp 17  Ht  (1.676 m)  Wt 250 lb (113.399 kg)  BMI 40.37 kg/m2  SpO2 99%  LMP 03/24/2014  Labs Review Labs Reviewed  BASIC METABOLIC PANEL - Abnormal; Notable for the following:    Sodium 134 (*)    Glucose, Bld 333 (*)    Anion gap 16 (*)    All other components within normal limits  PRO B NATRIURETIC PEPTIDE - Abnormal; Notable for the  following:    Pro B Natriuretic peptide (BNP) 234.8 (*)    All other components within normal limits  CBC WITH DIFFERENTIAL - Abnormal; Notable for the following:    WBC 10.6 (*)    All other components within normal limits  TROPONIN I - Abnormal; Notable for the following:    Troponin I 1.05 (*)    All other components within normal limits  I-STAT TROPOININ, ED - Abnormal; Notable for the following:    Troponin i, poc 0.53 (*)    All other components within normal limits  APTT  HEPARIN LEVEL (UNFRACTIONATED)  CBC    Imaging Review Dg Chest 2 View  04/14/2014   CLINICAL DATA:  Chest pain.  EXAM: CHEST  2 VIEW  COMPARISON:  Chest x-ray 04/14/2014.  FINDINGS: Lung volumes are normal. No consolidative airspace disease. No pleural effusions. No pneumothorax. No pulmonary nodule or mass noted. Pulmonary vasculature and the cardiomediastinal silhouette are within normal limits.  IMPRESSION: 1. No radiographic evidence of acute cardiopulmonary  disease. 2. The previously noted lucency in the medial aspect of the lower left hemithorax has completely resolved on the standing PA and lateral views, indicative of an imaging artifact related to projection on the prior study. No findings to suggest pneumomediastinum at this time.   Electronically Signed   By: Trudie Reed M.D.   On: 04/14/2014 20:13   Dg Chest Portable 1 View  04/14/2014   CLINICAL DATA:  Chest pain.  EXAM: PORTABLE CHEST - 1 VIEW  COMPARISON:  Chest x-ray 03/07/2009.  FINDINGS: Lung volumes are normal. No consolidative airspace disease. No pleural effusions. No pneumothorax. No pulmonary nodule or mass noted. Pulmonary vasculature and the cardiomediastinal silhouette are within normal limits, although there is an unusual lucency in the a lower aspect of the left hemithorax prominently all lying the distal descending thoracic aorta.  IMPRESSION: Unusual lucency in the medial aspect of the lower left hemithorax. This may simply be  projectional (which is favored), however, if there is any clinical concern for pneumomediastinum, repeat standing PA and lateral chest x-ray is recommended at this time, as these findings could be seen in the setting of pneumomediastinum.   Electronically Signed   By: Trudie Reed M.D.   On: 04/14/2014 19:34     Date: 04/14/2014  Rate: 98  Rhythm: normal sinus rhythm  QRS Axis: normal  Intervals: normal  ST/T Wave abnormalities: t wave inversion in aVL  Conduction Disutrbances:none     MDM   Final diagnoses:  Non-STEMI (non-ST elevated myocardial infarction)    Nursing notes including past medical history and social history reviewed and considered in documentation Labs/vital reviewed and considered xrays reviewed and considered     Joya Gaskins, MD 04/14/14 2106

## 2014-04-15 ENCOUNTER — Encounter (HOSPITAL_COMMUNITY): Payer: Self-pay | Admitting: *Deleted

## 2014-04-15 DIAGNOSIS — E785 Hyperlipidemia, unspecified: Secondary | ICD-10-CM

## 2014-04-15 DIAGNOSIS — I214 Non-ST elevation (NSTEMI) myocardial infarction: Secondary | ICD-10-CM

## 2014-04-15 DIAGNOSIS — I10 Essential (primary) hypertension: Secondary | ICD-10-CM

## 2014-04-15 LAB — BASIC METABOLIC PANEL
Anion gap: 16 — ABNORMAL HIGH (ref 5–15)
BUN: 13 mg/dL (ref 6–23)
CALCIUM: 9.3 mg/dL (ref 8.4–10.5)
CHLORIDE: 96 meq/L (ref 96–112)
CO2: 21 mEq/L (ref 19–32)
CREATININE: 0.54 mg/dL (ref 0.50–1.10)
GFR calc Af Amer: >90 mL/min (ref >90)
GFR calc non Af Amer: >90 mL/min (ref >90)
GLUCOSE: 273 mg/dL — AB (ref 70–99)
Potassium: 4.1 mEq/L (ref 3.7–5.3)
Sodium: 133 mEq/L — ABNORMAL LOW (ref 137–147)

## 2014-04-15 LAB — HEMOGLOBIN A1C
HEMOGLOBIN A1C: 11 % — AB (ref <5.7)
Mean Plasma Glucose: 269 mg/dL — ABNORMAL HIGH (ref <117)

## 2014-04-15 LAB — GLUCOSE, CAPILLARY
Glucose-Capillary: 264 mg/dL — ABNORMAL HIGH (ref 70–99)
Glucose-Capillary: 287 mg/dL — ABNORMAL HIGH (ref 70–99)
Glucose-Capillary: 175 mg/dL — ABNORMAL HIGH (ref 70–99)

## 2014-04-15 LAB — LIPID PANEL
Cholesterol: 270 mg/dL — ABNORMAL HIGH (ref 0–200)
HDL: 42 mg/dL (ref >39)
LDL CALC: UNDETERMINED mg/dL (ref 0–99)
Total CHOL/HDL Ratio: 6.4 RATIO
Triglycerides: 748 mg/dL — ABNORMAL HIGH (ref <150)
VLDL: UNDETERMINED mg/dL (ref 0–40)

## 2014-04-15 LAB — HEPARIN LEVEL (UNFRACTIONATED)
HEPARIN UNFRACTIONATED: 0.26 [IU]/mL — AB (ref 0.30–0.70)
HEPARIN UNFRACTIONATED: 0.66 [IU]/mL (ref 0.30–0.70)

## 2014-04-15 LAB — CBC
HEMATOCRIT: 37.8 % (ref 36.0–46.0)
Hemoglobin: 12.6 g/dL (ref 12.0–15.0)
MCH: 29.2 pg (ref 26.0–34.0)
MCHC: 33.3 g/dL (ref 30.0–36.0)
MCV: 87.5 fL (ref 78.0–100.0)
Platelets: 361 10*3/uL (ref 150–400)
RBC: 4.32 MIL/uL (ref 3.87–5.11)
RDW: 12.2 % (ref 11.5–15.5)
WBC: 10 10*3/uL (ref 4.0–10.5)

## 2014-04-15 LAB — APTT: aPTT: 37 seconds (ref 24–37)

## 2014-04-15 LAB — MRSA PCR SCREENING: MRSA BY PCR: NEGATIVE

## 2014-04-15 LAB — T4, FREE: Free T4: 1.28 ng/dL (ref 0.80–1.80)

## 2014-04-15 LAB — PROTIME-INR
INR: 1.03 (ref 0.00–1.49)
PROTHROMBIN TIME: 13.5 s (ref 11.6–15.2)

## 2014-04-15 LAB — TSH: TSH: 2.76 u[IU]/mL (ref 0.350–4.500)

## 2014-04-15 MED ORDER — CARVEDILOL 12.5 MG PO TABS
12.5000 mg | ORAL_TABLET | Freq: Two times a day (BID) | ORAL | Status: DC
Start: 2014-04-15 — End: 2014-04-18
  Administered 2014-04-15 – 2014-04-18 (×7): 12.5 mg via ORAL
  Filled 2014-04-15 (×9): qty 1

## 2014-04-15 MED ORDER — CLONIDINE HCL 0.1 MG PO TABS
0.1000 mg | ORAL_TABLET | Freq: Once | ORAL | Status: AC
  Administered 2014-04-15: 0.1 mg via ORAL
  Filled 2014-04-15: qty 1

## 2014-04-15 MED ORDER — FENOFIBRATE 54 MG PO TABS
54.0000 mg | ORAL_TABLET | Freq: Every day | ORAL | Status: DC
  Administered 2014-04-15 – 2014-04-18 (×4): 54 mg via ORAL
  Filled 2014-04-15 (×4): qty 1

## 2014-04-15 MED ORDER — LISINOPRIL 40 MG PO TABS
40.0000 mg | ORAL_TABLET | Freq: Every day | ORAL | Status: DC
  Administered 2014-04-15 – 2014-04-18 (×4): 40 mg via ORAL
  Filled 2014-04-15 (×4): qty 1

## 2014-04-15 MED ORDER — INSULIN ASPART 100 UNIT/ML ~~LOC~~ SOLN
0.0000 [IU] | Freq: Every day | SUBCUTANEOUS | Status: DC
Start: 2014-04-15 — End: 2014-04-18
  Administered 2014-04-16 – 2014-04-17 (×2): 2 [IU] via SUBCUTANEOUS

## 2014-04-15 MED ORDER — HEPARIN BOLUS VIA INFUSION
3000.0000 [IU] | Freq: Once | INTRAVENOUS | Status: AC
  Administered 2014-04-15: 3000 [IU] via INTRAVENOUS
  Filled 2014-04-15: qty 3000

## 2014-04-15 MED ORDER — LABETALOL HCL 5 MG/ML IV SOLN
10.0000 mg | INTRAVENOUS | Status: DC | PRN
  Administered 2014-04-15 (×2): 10 mg via INTRAVENOUS
  Filled 2014-04-15 (×2): qty 4

## 2014-04-15 MED ORDER — AMLODIPINE BESYLATE 5 MG PO TABS
5.0000 mg | ORAL_TABLET | Freq: Every day | ORAL | Status: DC
  Administered 2014-04-17: 5 mg via ORAL
  Filled 2014-04-15 (×4): qty 1

## 2014-04-15 MED ORDER — TRAMADOL HCL 50 MG PO TABS: 50.0000 mg | ORAL_TABLET | Freq: Four times a day (QID) | ORAL | Status: DC | PRN

## 2014-04-15 MED ORDER — INSULIN ASPART 100 UNIT/ML ~~LOC~~ SOLN
0.0000 [IU] | Freq: Three times a day (TID) | SUBCUTANEOUS | Status: DC
  Administered 2014-04-15: 11 [IU] via SUBCUTANEOUS
  Administered 2014-04-15: 7 [IU] via SUBCUTANEOUS
  Administered 2014-04-16: 4 [IU] via SUBCUTANEOUS
  Administered 2014-04-16 (×2): 7 [IU] via SUBCUTANEOUS
  Administered 2014-04-17: 4 [IU] via SUBCUTANEOUS
  Administered 2014-04-18: 7 [IU] via SUBCUTANEOUS

## 2014-04-15 MED ORDER — LABETALOL HCL 5 MG/ML IV SOLN
10.0000 mg | INTRAVENOUS | Status: DC | PRN
  Filled 2014-04-15: qty 4

## 2014-04-15 MED ORDER — ZOLPIDEM TARTRATE 5 MG PO TABS
5.0000 mg | ORAL_TABLET | Freq: Once | ORAL | Status: AC
  Administered 2014-04-15: 5 mg via ORAL
  Filled 2014-04-15: qty 1

## 2014-04-15 MED ORDER — INSULIN DETEMIR 100 UNIT/ML ~~LOC~~ SOLN
20.0000 [IU] | Freq: Every day | SUBCUTANEOUS | Status: DC
  Administered 2014-04-15 – 2014-04-17 (×3): 20 [IU] via SUBCUTANEOUS
  Filled 2014-04-15 (×6): qty 0.2

## 2014-04-15 NOTE — Progress Notes (Signed)
CARDIAC REHAB PHASE I   PRE:  Rate/Rhythm: 86 SR  BP:  Sitting: 163/94      SaO2: 98 RA  MODE:  Ambulation: 350 ft   POST:  Rate/Rhythm: 80 SR  BP:  Sitting: 179/110 (MD made aware)     SaO2: 97 RA 1220-1236 Patient ambulated in hallway independently. Steady gait noted. Denied complaints. Prior to ambulation, MD verbalized it was ok to walk patient with BP noted above. Post walk DBP 110 and Dr. Gala Romney aware. Post walk patient back to sitting on bedside with grandmother present. Will continue to follow patient post cardiac cath Monday.  Maude Leriche, BSN 04/15/2014 12:34 PM

## 2014-04-15 NOTE — Progress Notes (Signed)
10 mg. Labetalol given for increased B/P

## 2014-04-15 NOTE — Progress Notes (Signed)
Subjective:   Barbara Jacobs is a 43 y.o with CAD, s/p Acute inferior STEMI in 03/2009. She had BMS to proximal PDA. She has HTN, DM, dyslipidemia and obesity. Admitted 9/11 with NSTEMI (trop 1.05 on admit)   Now CP free. C/o HA. BP remains very high. TGs 748.  No f/u trop. ECG with progressive lateral TWI     Intake/Output Summary (Last 24 hours) at 04/15/14 0900 Last data filed at 04/15/14 0835  Gross per 24 hour  Intake    382 ml  Output   1625 ml  Net  -1243 ml    Current meds: . aspirin EC  81 mg Oral Daily  . atorvastatin  80 mg Oral q1800  . lisinopril  10 mg Oral Daily  . metoprolol tartrate  25 mg Oral BID  . multivitamin with minerals  1 tablet Oral Daily  . omega-3 acid ethyl esters  1 g Oral Daily  . sertraline  150 mg Oral Daily   Infusions: . heparin 1,600 Units/hr (04/15/14 0502)     Objective:  Blood pressure 196/109, pulse 81, temperature 98.4 F (36.9 C), temperature source Oral, resp. rate 15, height  (1.651 m), weight 116.4 kg (256 lb 9.9 oz), last menstrual period 03/24/2014, SpO2 97.00%. Weight change:   Physical Exam: General:  Well appearing. No resp difficulty HEENT: normal Neck: supple. JVP . Carotids 2+ bilat; no bruits. No lymphadenopathy or thryomegaly appreciated. Cor: PMI nondisplaced. Regular rate & rhythm. No rubs, gallops or murmurs. Lungs: clear Abdomen: soft, nontender, nondistended. No hepatosplenomegaly. No bruits or masses. Good bowel sounds. Extremities: no cyanosis, clubbing, rash, edema Neuro: alert & orientedx3, cranial nerves grossly intact. moves all 4 extremities w/o difficulty. Affect pleasant  Telemetry: SR  Lab Results: Basic Metabolic Panel:  Recent Labs Lab 04/14/14 1745 04/15/14 0319  NA 134* 133*  K 4.6 4.1  CL 96 96  CO2 22 21  GLUCOSE 333* 273*  BUN 16 13  CREATININE 0.65 0.54  CALCIUM 9.6 9.3   Liver Function Tests: No results found for this basename: AST, ALT, ALKPHOS, BILITOT, PROT,  ALBUMIN,  in the last 168 hours No results found for this basename: LIPASE, AMYLASE,  in the last 168 hours No results found for this basename: AMMONIA,  in the last 168 hours CBC:  Recent Labs Lab 04/14/14 1745 04/15/14 0319  WBC 10.6* 10.0  NEUTROABS 7.4  --   HGB 13.3 12.6  HCT 39.6 37.8  MCV 89.2 87.5  PLT 348 361   Cardiac Enzymes:  Recent Labs Lab 04/14/14 1811  TROPONINI 1.05*   BNP: No components found with this basename: POCBNP,  CBG: No results found for this basename: GLUCAP,  in the last 168 hours Microbiology: No results found for this basename: cult   No results found for this basename: CULT, SDES,  in the last 168 hours  Imaging: Dg Chest 2 View  04/14/2014   CLINICAL DATA:  Chest pain.  EXAM: CHEST  2 VIEW  COMPARISON:  Chest x-ray 04/14/2014.  FINDINGS: Lung volumes are normal. No consolidative airspace disease. No pleural effusions. No pneumothorax. No pulmonary nodule or mass noted. Pulmonary vasculature and the cardiomediastinal silhouette are within normal limits.  IMPRESSION: 1. No radiographic evidence of acute cardiopulmonary disease. 2. The previously noted lucency in the medial aspect of the lower left hemithorax has completely resolved on the standing PA and lateral views, indicative of an imaging artifact related to projection on the prior study. No findings  to suggest pneumomediastinum at this time.   Electronically Signed   By: Trudie Reed M.D.   On: 04/14/2014 20:13   Dg Chest Portable 1 View  04/14/2014   CLINICAL DATA:  Chest pain.  EXAM: PORTABLE CHEST - 1 VIEW  COMPARISON:  Chest x-ray 03/07/2009.  FINDINGS: Lung volumes are normal. No consolidative airspace disease. No pleural effusions. No pneumothorax. No pulmonary nodule or mass noted. Pulmonary vasculature and the cardiomediastinal silhouette are within normal limits, although there is an unusual lucency in the a lower aspect of the left hemithorax prominently all lying the distal  descending thoracic aorta.  IMPRESSION: Unusual lucency in the medial aspect of the lower left hemithorax. This may simply be projectional (which is favored), however, if there is any clinical concern for pneumomediastinum, repeat standing PA and lateral chest x-ray is recommended at this time, as these findings could be seen in the setting of pneumomediastinum.   Electronically Signed   By: Trudie Reed M.D.   On: 04/14/2014 19:34     ASSESSMENT:  1. NSTEMI 2. CAD s/p inferior STEMI 2010 3. Severe hypertriglyceridemia     --previously switched to Zetia. Statin re-initiated here 4. Malignant HTN 5. Morbid obesity 6. DM2, uncontrolled - on metformin at home 7. Medication noncompliance  PLAN/DISCUSSION:  Multiple active issues. She has had NSTEMI. Now CP free. Will need cath Monday. Treat with ASA, high-dose statin, b-blocker and heparin.   BP very high. Will rx aggressively with oral meds. If remains uncontrolled will need cardene gtt.   Lipids now on high-dose statin. Add fibrate with markedly elevated TGs. Needs insulin for DM2.   DM2. Metformin on hold. Start insulin. Pharmacy to help dose. DM2 teaching consult. Check HgB A1c  CR consult.    LOS: 1 day  Arvilla Meres, MD 04/15/2014, 9:00 AM

## 2014-04-15 NOTE — Progress Notes (Signed)
56 250  ANTICOAGULATION CONSULT NOTE - Initial Consult  Pharmacy Cons Heparin Indication: chest pain/ACS  Allergies  Allergen Reactions  . Ibuprofen Swelling    Throat swells shut  . Naproxen     Swell up and throat swells shut  . Pineapple   . Shellfish Allergy Hives, Itching and Swelling    Patient Measurements: Height:  (165.1 cm) Weight: 256 lb 9.9 oz (116.4 kg) IBW/kg (Calculated) : 57 x1.25 = 73kg Heparin Dosing Weight: 85kg  Vital Signs: Temp: 98.9 F (37.2 C) (09/12 0346) Temp src: Oral (09/12 0346) BP: 158/87 mmHg (09/12 0144) Pulse Rate: 91 (09/12 0144)  Labs:  Recent Labs  04/14/14 1745 04/14/14 1811 04/15/14 0319  HGB 13.3  --  12.6  HCT 39.6  --  37.8  PLT 348  --  361  APTT  --   --  37  LABPROT  --   --  13.5  INR  --   --  1.03  HEPARINUNFRC  --   --  <0.10*  CREATININE 0.65  --   --   TROPONINI  --  1.05*  --     Estimated Creatinine Clearance: 115.7 ml/min (by C-G formula based on Cr of 0.65).   Medical History: Past Medical History  Diagnosis Date  . Diabetes mellitus without complication   . Hypertension   . Myocardial infarction   . Hyperlipidemia   . Coronary artery disease     Medications:  Prescriptions prior to admission  Medication Sig Dispense Refill  . fish oil-omega-3 fatty acids 1000 MG capsule Take 2 g by mouth daily.      Marland Kitchen lisinopril-hydrochlorothiazide (PRINZIDE,ZESTORETIC) 20-25 MG per tablet Take 1 tablet by mouth daily.      . metFORMIN (GLUCOPHAGE) 500 MG tablet Take 1 tablet (500 mg total) by mouth 2 (two) times daily with a meal.  60 tablet  1  . Multiple Vitamins-Minerals (MULTIVITAMIN WITH MINERALS) tablet Take 1 tablet by mouth daily.      . sertraline (ZOLOFT) 100 MG tablet Take 150 mg by mouth daily.        Assessment: 43 yo F admitted 04/14/2014 with a headache and evolving CP found to have a positive troponin.  Pharmacy consulted to dose heparin.  HL #1 is undetectable. Infusing properly. No  interruptions.      Goal of Therapy:  Heparin level 0.3-0.7 units/ml Monitor platelets by anticoagulation protocol: Yes   Plan:  rebolus with 3000 unitsx1 then incease to 1600 units/hr. (14units/kg/hr)   Recheck HL in 6 hours.

## 2014-04-15 NOTE — Progress Notes (Signed)
Dr. Terressa Koyanagi notified, labetalol ordered

## 2014-04-15 NOTE — Progress Notes (Signed)
ANTICOAGULATION CONSULT NOTE   Pharmacy Cons Heparin Indication: chest pain/ACS  Allergies  Allergen Reactions  . Ibuprofen Swelling    Throat swells shut  . Naproxen     Swell up and throat swells shut  . Pineapple   . Shellfish Allergy Hives, Itching and Swelling    Patient Measurements: Height:  (165.1 cm) Weight: 256 lb 9.9 oz (116.4 kg) IBW/kg (Calculated) : 57 Heparin Dosing Weight: 85kg  Vital Signs: Temp: 98.6 F (37 C) (09/12 1218) Temp src: Oral (09/12 1218) BP: 163/94 mmHg (09/12 1218) Pulse Rate: 106 (09/12 1218)  Labs:  Recent Labs  04/14/14 1745 04/14/14 1811 04/15/14 0319 04/15/14 1130  HGB 13.3  --  12.6  --   HCT 39.6  --  37.8  --   PLT 348  --  361  --   APTT  --   --  37  --   LABPROT  --   --  13.5  --   INR  --   --  1.03  --   HEPARINUNFRC  --   --  <0.10* 0.26*  CREATININE 0.65  --  0.54  --   TROPONINI  --  1.05*  --   --     Estimated Creatinine Clearance: 115.7 ml/min (by C-G formula based on Cr of 0.54).  Medications:  Heparin @ 1600 units/hr  Assessment: 43 yo F admitted 04/14/2014 with a headache and evolving CP found to have a positive troponin.  Pharmacy consulted to dose heparin. Initial level undetectable and she was re-bolused and rate was increased. Level on 1600 units/hr is still low at 0.26 units/mL.  CBC is wnl and stable. No bleeding noted.  Goal of Therapy:  Heparin level 0.3-0.7 units/ml Monitor platelets by anticoagulation protocol: Yes   Plan:  1. Increase heparin gtt to 1800 units/hr 2. Recheck level in 6 hours 3. Daily HL and CBC 4. Follow for s/s bleeding 5. Plans for cath Monday  Barbara Jacobs D. Barbara Jacobs, PharmD, BCPS Clinical Pharmacist Pager: [phone] 04/15/2014 12:41 PM

## 2014-04-15 NOTE — Progress Notes (Signed)
04/15/14  Pharmacy- Heparin  Heparin level 0.66 on 1800 units/hr  A/P:  43yo female on heparin for chest pain/ACS.  Heparin level therapeutic on current rate.  No bleeding problems noted per d/w RN.  1-  Repeat Heparin level in 6hr to verify therapeutic  Marisue Humble, PharmD Clinical Pharmacist [facility]

## 2014-04-15 NOTE — Progress Notes (Signed)
Dr. Gala Romney aware.  See NO

## 2014-04-16 DIAGNOSIS — E1351 Other specified diabetes mellitus with diabetic peripheral angiopathy without gangrene: Secondary | ICD-10-CM

## 2014-04-16 LAB — HEPARIN LEVEL (UNFRACTIONATED)
HEPARIN UNFRACTIONATED: 0.88 [IU]/mL — AB (ref 0.30–0.70)
Heparin Unfractionated: 1.07 IU/mL — ABNORMAL HIGH (ref 0.30–0.70)
Heparin Unfractionated: 0.79 IU/mL — ABNORMAL HIGH (ref 0.30–0.70)

## 2014-04-16 LAB — CBC
HEMATOCRIT: 36 % (ref 36.0–46.0)
Hemoglobin: 12 g/dL (ref 12.0–15.0)
MCH: 29.3 pg (ref 26.0–34.0)
MCHC: 33.3 g/dL (ref 30.0–36.0)
MCV: 88 fL (ref 78.0–100.0)
PLATELETS: 325 10*3/uL (ref 150–400)
RBC: 4.09 MIL/uL (ref 3.87–5.11)
RDW: 12 % (ref 11.5–15.5)
WBC: 8.9 10*3/uL (ref 4.0–10.5)

## 2014-04-16 LAB — GLUCOSE, CAPILLARY
GLUCOSE-CAPILLARY: 219 mg/dL — AB (ref 70–99)
GLUCOSE-CAPILLARY: 195 mg/dL — AB (ref 70–99)
Glucose-Capillary: 242 mg/dL — ABNORMAL HIGH (ref 70–99)
Glucose-Capillary: 208 mg/dL — ABNORMAL HIGH (ref 70–99)

## 2014-04-16 LAB — TROPONIN I: Troponin I: 1.1 ng/mL (ref <0.30)

## 2014-04-16 MED ORDER — ZOLPIDEM TARTRATE 5 MG PO TABS
5.0000 mg | ORAL_TABLET | Freq: Once | ORAL | Status: AC
  Administered 2014-04-16: 5 mg via ORAL
  Filled 2014-04-16: qty 1

## 2014-04-16 MED ORDER — HEPARIN (PORCINE) IN NACL 100-0.45 UNIT/ML-% IJ SOLN
1400.0000 [IU]/h | INTRAMUSCULAR | Status: DC
  Administered 2014-04-16: 1550 [IU]/h via INTRAVENOUS
  Filled 2014-04-16 (×2): qty 250

## 2014-04-16 NOTE — Progress Notes (Signed)
Subjective:   Barbara Jacobs is a 43 y.o with morbid obesity, HTN, DM, HL and CAD. She is s/p  inferior STEMI in 03/2009. She had BMS to proximal PDA.  Admitted 9/11 with NSTEMI (trop 1.05 on admit) and severe HTN. ECG with progressive lateral TWI. Triglycerides > 700.   Now CP free. Feels great.  BP under control now.  HgBA1c 11.0. Started on insulin.    Intake/Output Summary (Last 24 hours) at 04/16/14 0858 Last data filed at 04/16/14 0600  Gross per 24 hour  Intake    379 ml  Output    900 ml  Net   -521 ml    Current meds: . amLODipine  5 mg Oral Daily  . aspirin EC  81 mg Oral Daily  . atorvastatin  80 mg Oral q1800  . carvedilol  12.5 mg Oral BID WC  . fenofibrate  54 mg Oral Daily  . insulin aspart  0-20 Units Subcutaneous TID WC  . insulin aspart  0-5 Units Subcutaneous QHS  . insulin detemir  20 Units Subcutaneous QHS  . lisinopril  40 mg Oral Daily  . multivitamin with minerals  1 tablet Oral Daily  . omega-3 acid ethyl esters  1 g Oral Daily  . sertraline  150 mg Oral Daily   Infusions: . heparin 1,700 Units/hr (04/16/14 0509)     Objective:  Blood pressure 134/98, pulse 79, temperature 99 F (37.2 C), temperature source Oral, resp. rate 15, height  (1.651 m), weight 117 kg (257 lb 15 oz), last menstrual period 03/24/2014, SpO2 96.00%. Weight change: 3.601 kg (7 lb 15 oz)  Physical Exam: General:  Well appearing. No resp difficulty HEENT: normal Neck: supple. JVP . Carotids 2+ bilat; no bruits. No lymphadenopathy or thryomegaly appreciated. Cor: PMI nondisplaced. Regular rate & rhythm. No rubs, gallops or murmurs. Lungs: clear Abdomen: soft, nontender, nondistended. No hepatosplenomegaly. No bruits or masses. Good bowel sounds. Extremities: no cyanosis, clubbing, rash, edema Neuro: alert & orientedx3, cranial nerves grossly intact. moves all 4 extremities w/o difficulty. Affect pleasant  Telemetry: SR  Lab Results: Basic Metabolic Panel:  Recent  Labs Lab 04/14/14 1745 04/15/14 0319  NA 134* 133*  K 4.6 4.1  CL 96 96  CO2 22 21  GLUCOSE 333* 273*  BUN 16 13  CREATININE 0.65 0.54  CALCIUM 9.6 9.3   Liver Function Tests: No results found for this basename: AST, ALT, ALKPHOS, BILITOT, PROT, ALBUMIN,  in the last 168 hours No results found for this basename: LIPASE, AMYLASE,  in the last 168 hours No results found for this basename: AMMONIA,  in the last 168 hours CBC:  Recent Labs Lab 04/14/14 1745 04/15/14 0319 04/16/14 0340  WBC 10.6* 10.0 8.9  NEUTROABS 7.4  --   --   HGB 13.3 12.6 12.0  HCT 39.6 37.8 36.0  MCV 89.2 87.5 88.0  PLT 348 361 325   Cardiac Enzymes:  Recent Labs Lab 04/14/14 1811 04/16/14 0340  TROPONINI 1.05* 1.10*   BNP: No components found with this basename: POCBNP,  CBG:  Recent Labs Lab 04/15/14 1217 04/15/14 1615 04/15/14 2059 04/16/14 0808  GLUCAP 264* 287* 175* 219*   Microbiology: No results found for this basename: cult   No results found for this basename: CULT, SDES,  in the last 168 hours  Imaging: Dg Chest 2 View  04/14/2014   CLINICAL DATA:  Chest pain.  EXAM: CHEST  2 VIEW  COMPARISON:  Chest x-ray 04/14/2014.  FINDINGS: Lung volumes are normal. No consolidative airspace disease. No pleural effusions. No pneumothorax. No pulmonary nodule or mass noted. Pulmonary vasculature and the cardiomediastinal silhouette are within normal limits.  IMPRESSION: 1. No radiographic evidence of acute cardiopulmonary disease. 2. The previously noted lucency in the medial aspect of the lower left hemithorax has completely resolved on the standing PA and lateral views, indicative of an imaging artifact related to projection on the prior study. No findings to suggest pneumomediastinum at this time.   Electronically Signed   By: Trudie Reed M.D.   On: 04/14/2014 20:13   Dg Chest Portable 1 View  04/14/2014   CLINICAL DATA:  Chest pain.  EXAM: PORTABLE CHEST - 1 VIEW  COMPARISON:  Chest  x-ray 03/07/2009.  FINDINGS: Lung volumes are normal. No consolidative airspace disease. No pleural effusions. No pneumothorax. No pulmonary nodule or mass noted. Pulmonary vasculature and the cardiomediastinal silhouette are within normal limits, although there is an unusual lucency in the a lower aspect of the left hemithorax prominently all lying the distal descending thoracic aorta.  IMPRESSION: Unusual lucency in the medial aspect of the lower left hemithorax. This may simply be projectional (which is favored), however, if there is any clinical concern for pneumomediastinum, repeat standing PA and lateral chest x-ray is recommended at this time, as these findings could be seen in the setting of pneumomediastinum.   Electronically Signed   By: Trudie Reed M.D.   On: 04/14/2014 19:34     ASSESSMENT:  1. NSTEMI 2. CAD s/p inferior STEMI 2010 3. Severe hypertriglyceridemia     --previously switched to Zetia. Statin re-initiated here 4. Malignant HTN 5. Morbid obesity 6. DM2, uncontrolled - on metformin at home 7. Medication noncompliance  PLAN/DISCUSSION:  Much improved,   She has had NSTEMI. Now CP free. On board for cath tomorrow. Treat with ASA, high-dose statin, b-blocker and heparin.   BP improved.   Severe hyperTG due to uncontrolled DM2/metabolic syndrome.  Now on high-dose statin/fibrate.   DM2. Metformin on hold. Started insulin. Pharmacy to help dose. DM2 teaching consult ordered. HgB A1c 11.0  CR on board.  Can got to tele.    LOS: 2 days  Arvilla Meres, MD 04/16/2014, 8:58 AM

## 2014-04-16 NOTE — Progress Notes (Signed)
ANTICOAGULATION CONSULT NOTE - Follow Up Consult  Pharmacy Consult for heparin Indication: NSTEMI  Labs:  Recent Labs  04/14/14 1745 04/14/14 1811 04/15/14 0319  04/16/14 0340 04/16/14 1225 04/16/14 2253  HGB 13.3  --  12.6  --  12.0  --   --   HCT 39.6  --  37.8  --  36.0  --   --   PLT 348  --  361  --  325  --   --   APTT  --   --  37  --   --   --   --   LABPROT  --   --  13.5  --   --   --   --   INR  --   --  1.03  --   --   --   --   HEPARINUNFRC  --   --  <0.10*  < > 0.88* 1.07* 0.79*  CREATININE 0.65  --  0.54  --   --   --   --   TROPONINI  --  1.05*  --   --  1.10*  --   --   < > = values in this interval not displayed.   Assessment: 43yo female remains slightly supratherapeutic on heparin after rate decreases though getting closer to goal.  Goal of Therapy:  Heparin level 0.3-0.7 units/ml   Plan:  Will decrease heparin gtt by another 1 unit/kg/hr to 1400 units/hr and check level with am labs.  Vernard Gambles, PharmD, BCPS  04/16/2014,11:43 PM

## 2014-04-16 NOTE — Progress Notes (Signed)
Heparin on hold x 1 hour as per pharmacy

## 2014-04-16 NOTE — Progress Notes (Signed)
Inpatient Diabetes Program Recommendations  AACE/ADA: New Consensus Statement on Inpatient Glycemic Control (2013)  Target Ranges:  Prepandial:   less than 140 mg/dL      Peak postprandial:   less than 180 mg/dL (1-2 hours)      Critically ill patients:  140 - 180 mg/dL   Results for RIVKA, BAUNE (MRN [id number]) as of 04/16/2014 13:21  Ref. Range 04/15/2014 12:17 04/15/2014 16:15 04/15/2014 20:59 04/16/2014 08:08 04/16/2014 11:15  Glucose-Capillary Latest Range: 70-99 mg/dL 846 (H) 962 (H) 952 (H) 219 (H) 242 (H)   Diabetes history: DM2 Outpatient Diabetes medications: Metformin 500 mg BID Current orders for Inpatient glycemic control: Levemir 20 units QHS, Novolog 0-20 units AC, Novolog 0-5 units HS  Inpatient Diabetes Program Recommendations Insulin - Basal: Please consider increasing Levemir to 25 units QHS. Insulin - Meal Coverage: Please consider ordering Novolog 4 units TID with meals for meal coverage.  Note: In reviewing the charted noted patient has been on Victoza and Levemir in the past (according to office notes from Dr. Beverely Low on 1/282014 and 09/22/2012). CBGs have ranged from 175-287 mg/dl over the past 24 hours and fasting glucose was 219 mg/dl this morning. Please increase Levemir to 25 units QHS and order Novolog 4 units TID with meals (in addition to Novolog correction).  Thanks, Orlando Penner, RN, MSN, CCRN Diabetes Coordinator Inpatient Diabetes Program [phone] (Team Pager) [phone] (AP office) [phone] [facility] office)

## 2014-04-16 NOTE — Progress Notes (Signed)
ANTICOAGULATION CONSULT NOTE   Pharmacy Cons Heparin Indication: chest pain/ACS  Allergies  Allergen Reactions  . Ibuprofen Swelling    Throat swells shut  . Naproxen     Swell up and throat swells shut  . Pineapple   . Shellfish Allergy Hives, Itching and Swelling    Patient Measurements: Height:  (165.1 cm) Weight: 257 lb 15 oz (117 kg) IBW/kg (Calculated) : 57 Heparin Dosing Weight: 85kg  Vital Signs: Temp: 97.9 F (36.6 C) (09/13 1111) Temp src: Oral (09/13 1111) BP: 112/66 mmHg (09/13 1340) Pulse Rate: 76 (09/13 1111)  Labs:  Recent Labs  04/14/14 1745 04/14/14 1811 04/15/14 0319  04/15/14 1858 04/16/14 0340 04/16/14 1225  HGB 13.3  --  12.6  --   --  12.0  --   HCT 39.6  --  37.8  --   --  36.0  --   PLT 348  --  361  --   --  325  --   APTT  --   --  37  --   --   --   --   LABPROT  --   --  13.5  --   --   --   --   INR  --   --  1.03  --   --   --   --   HEPARINUNFRC  --   --  <0.10*  < > 0.66 0.88* 1.07*  CREATININE 0.65  --  0.54  --   --   --   --   TROPONINI  --  1.05*  --   --   --  1.10*  --   < > = values in this interval not displayed.  Estimated Creatinine Clearance: 115.9 ml/min (by C-G formula based on Cr of 0.54).  Medications:  Heparin @ 1700 units/hr  Assessment: 43 yo F admitted 04/14/2014 with a headache and evolving CP found to have a positive troponin.  Pharmacy consulted to dose heparin. Initial 2 levels were low and her rate was increased to 1800 units/hr. After increase, level made a large increase and then continued to climb. Most recent heparin level >1. CBC is wnl and stable, no bleeding noted.  Goal of Therapy:  Heparin level 0.3-0.7 units/ml Monitor platelets by anticoagulation protocol: Yes   Plan:  1. Hold heparin for 1 hour 2. At 1530 this afternoon, resume heparin at rate of 1550 units/hr 3. HL at 2200 4. Daily HL and CBC 5. Cath tomorrow 6. Follow for s/s bleeding  Zaide Kardell D. Khloi Rawl, PharmD,  BCPS Clinical Pharmacist Pager: [phone] 04/16/2014 2:35 PM

## 2014-04-16 NOTE — Progress Notes (Signed)
ANTICOAGULATION CONSULT NOTE - Follow Up Consult  Pharmacy Consult for heparin Indication: NSTEMI  Labs:  Recent Labs  04/14/14 1745 04/14/14 1811  04/15/14 0319 04/15/14 1130 04/15/14 1858 04/16/14 0340  HGB 13.3  --   --  12.6  --   --  12.0  HCT 39.6  --   --  37.8  --   --  36.0  PLT 348  --   --  361  --   --  325  APTT  --   --   --  37  --   --   --   LABPROT  --   --   --  13.5  --   --   --   INR  --   --   --  1.03  --   --   --   HEPARINUNFRC  --   --   < > <0.10* 0.26* 0.66 0.88*  CREATININE 0.65  --   --  0.54  --   --   --   TROPONINI  --  1.05*  --   --   --   --   --   < > = values in this interval not displayed.   Assessment: 43yo female now supratherapeutic on heparin after one level at high end of goal.  Goal of Therapy:  Heparin level 0.3-0.7 units/ml   Plan:  Will decrease heparin gtt to 1700 units/hr (was low at 1600 units/hr and high at 1800 units/hr) and check level in 6hr.  Vernard Gambles, PharmD, BCPS  04/16/2014,5:09 AM

## 2014-04-17 ENCOUNTER — Encounter (HOSPITAL_COMMUNITY)
Admission: EM | Disposition: A | Discharge status: Home / Self Care | Payer: Self-pay | Source: Home / Self Care | Attending: Cardiology

## 2014-04-17 DIAGNOSIS — I2510 Atherosclerotic heart disease of native coronary artery without angina pectoris: Secondary | ICD-10-CM

## 2014-04-17 HISTORY — PX: LEFT HEART CATHETERIZATION WITH CORONARY ANGIOGRAM: SHX5451

## 2014-04-17 LAB — BASIC METABOLIC PANEL
ANION GAP: 13 (ref 5–15)
BUN: 8 mg/dL (ref 6–23)
CO2: 21 mEq/L (ref 19–32)
Calcium: 9.3 mg/dL (ref 8.4–10.5)
Chloride: 101 mEq/L (ref 96–112)
Creatinine, Ser: 0.51 mg/dL (ref 0.50–1.10)
GFR calc non Af Amer: >90 mL/min (ref >90)
Glucose, Bld: 176 mg/dL — ABNORMAL HIGH (ref 70–99)
POTASSIUM: 4.6 meq/L (ref 3.7–5.3)
SODIUM: 135 meq/L — AB (ref 137–147)

## 2014-04-17 LAB — PREGNANCY, URINE: PREG TEST UR: NEGATIVE

## 2014-04-17 LAB — GLUCOSE, CAPILLARY
GLUCOSE-CAPILLARY: 175 mg/dL — AB (ref 70–99)
Glucose-Capillary: 151 mg/dL — ABNORMAL HIGH (ref 70–99)
Glucose-Capillary: 127 mg/dL — ABNORMAL HIGH (ref 70–99)
Glucose-Capillary: 238 mg/dL — ABNORMAL HIGH (ref 70–99)

## 2014-04-17 LAB — CBC
HCT: 38.8 % (ref 36.0–46.0)
HEMOGLOBIN: 13 g/dL (ref 12.0–15.0)
MCH: 29.5 pg (ref 26.0–34.0)
MCHC: 33.5 g/dL (ref 30.0–36.0)
MCV: 88.2 fL (ref 78.0–100.0)
PLATELETS: 317 10*3/uL (ref 150–400)
RBC: 4.4 MIL/uL (ref 3.87–5.11)
RDW: 12 % (ref 11.5–15.5)
WBC: 8.9 10*3/uL (ref 4.0–10.5)

## 2014-04-17 LAB — HEPARIN LEVEL (UNFRACTIONATED): HEPARIN UNFRACTIONATED: 0.77 [IU]/mL — AB (ref 0.30–0.70)

## 2014-04-17 SURGERY — LEFT HEART CATHETERIZATION WITH CORONARY ANGIOGRAM
Anesthesia: LOCAL

## 2014-04-17 MED ORDER — MIDAZOLAM HCL 2 MG/2ML IJ SOLN
INTRAMUSCULAR | Status: AC
  Filled 2014-04-17: qty 2

## 2014-04-17 MED ORDER — SODIUM CHLORIDE 0.9 % IV SOLN
INTRAVENOUS | Status: AC
  Administered 2014-04-17: 18:00:00 via INTRAVENOUS

## 2014-04-17 MED ORDER — ASPIRIN 81 MG PO CHEW
81.0000 mg | CHEWABLE_TABLET | ORAL | Status: AC
  Administered 2014-04-18: 81 mg via ORAL
  Filled 2014-04-17: qty 1

## 2014-04-17 MED ORDER — HEPARIN (PORCINE) IN NACL 100-0.45 UNIT/ML-% IJ SOLN
1200.0000 [IU]/h | INTRAMUSCULAR | Status: DC
  Administered 2014-04-17: 1200 [IU]/h via INTRAVENOUS

## 2014-04-17 MED ORDER — SODIUM CHLORIDE 0.9 % IJ SOLN: 3.0000 mL | INTRAMUSCULAR | Status: DC | PRN

## 2014-04-17 MED ORDER — HEPARIN (PORCINE) IN NACL 2-0.9 UNIT/ML-% IJ SOLN
INTRAMUSCULAR | Status: AC
  Filled 2014-04-17: qty 1000

## 2014-04-17 MED ORDER — FENTANYL CITRATE 0.05 MG/ML IJ SOLN
INTRAMUSCULAR | Status: AC
  Filled 2014-04-17: qty 2

## 2014-04-17 MED ORDER — VERAPAMIL HCL 2.5 MG/ML IV SOLN
INTRAVENOUS | Status: AC
  Filled 2014-04-17: qty 2

## 2014-04-17 MED ORDER — NITROGLYCERIN 1 MG/10 ML FOR IR/CATH LAB
INTRA_ARTERIAL | Status: AC
  Filled 2014-04-17: qty 10

## 2014-04-17 MED ORDER — ZOLPIDEM TARTRATE 5 MG PO TABS
5.0000 mg | ORAL_TABLET | Freq: Every evening | ORAL | Status: DC | PRN
  Administered 2014-04-17: 5 mg via ORAL
  Filled 2014-04-17: qty 1

## 2014-04-17 MED ORDER — SODIUM CHLORIDE 0.9 % IV SOLN: 250.0000 mL | INTRAVENOUS | Status: DC | PRN

## 2014-04-17 MED ORDER — SODIUM CHLORIDE 0.9 % IV SOLN: 1.0000 mL/kg/h | INTRAVENOUS | Status: DC

## 2014-04-17 MED ORDER — SODIUM CHLORIDE 0.9 % IJ SOLN
3.0000 mL | Freq: Two times a day (BID) | INTRAMUSCULAR | Status: DC
  Administered 2014-04-18: 3 mL via INTRAVENOUS

## 2014-04-17 MED ORDER — HEPARIN SODIUM (PORCINE) 1000 UNIT/ML IJ SOLN
INTRAMUSCULAR | Status: AC
  Filled 2014-04-17: qty 1

## 2014-04-17 MED ORDER — LIDOCAINE HCL (PF) 1 % IJ SOLN
INTRAMUSCULAR | Status: AC
  Filled 2014-04-17: qty 30

## 2014-04-17 MED ORDER — ALPRAZOLAM 0.5 MG PO TABS
0.5000 mg | ORAL_TABLET | Freq: Once | ORAL | Status: AC
  Administered 2014-04-17: 0.5 mg via ORAL
  Filled 2014-04-17: qty 1

## 2014-04-17 NOTE — Progress Notes (Signed)
ANTICOAGULATION CONSULT NOTE - Follow Up Consult  Pharmacy Consult:  Heparin Indication: chest pain/ACS  Allergies  Allergen Reactions  . Ibuprofen Swelling    Throat swells shut  . Naproxen     Swell up and throat swells shut  . Pineapple   . Shellfish Allergy Hives, Itching and Swelling    Patient Measurements: Height:  (165.1 cm) Weight: 255 lb 8.2 oz (115.9 kg) IBW/kg (Calculated) : 57 Heparin Dosing Weight: 85 kg  Vital Signs: Temp: 98.5 F (36.9 C) (09/14 0804) Temp src: Oral (09/14 0804) BP: 161/100 mmHg (09/14 0804) Pulse Rate: 70 (09/14 0804)  Labs:  Recent Labs  04/14/14 1745 04/14/14 1811 04/15/14 0319  04/16/14 0340 04/16/14 1225 04/16/14 2253 04/17/14 0705  HGB 13.3  --  12.6  --  12.0  --   --  13.0  HCT 39.6  --  37.8  --  36.0  --   --  38.8  PLT 348  --  361  --  325  --   --  317  APTT  --   --  37  --   --   --   --   --   LABPROT  --   --  13.5  --   --   --   --   --   INR  --   --  1.03  --   --   --   --   --   HEPARINUNFRC  --   --  <0.10*  < > 0.88* 1.07* 0.79* 0.77*  CREATININE 0.65  --  0.54  --   --   --   --   --   TROPONINI  --  1.05*  --   --  1.10*  --   --   --   < > = values in this interval not displayed.  Estimated Creatinine Clearance: 115.4 ml/min (by C-G formula based on Cr of 0.54).     Assessment: 68 YOF admitted 04/14/2014 with a headache and evolving chest pain, found to have a positive troponin. Pharmacy consulted to manage heparin for ACS.  Heparin level remains slightly supra-therapeutic despite rate adjustments.  No bleeding reported.  Noted plan for cath this afternoon.   Goal of Therapy:  Heparin level 0.3-0.7 units/ml Monitor platelets by anticoagulation protocol: Yes    Plan:  - Decrease heparin gtt to 1200 units/hr - Check 6 hr HL vs f/u post cath - Daily HL / CBC - F/U CBGs and insulin adjustment +/- resume home metformin post cath    Presten Joost D. Laney Potash, PharmD, BCPS Pager:  [phone] 04/17/2014, 8:28 AM

## 2014-04-17 NOTE — Progress Notes (Signed)
Patient Name: Barbara Jacobs Date of Encounter: 04/17/2014     Principal Problem:   NSTEMI (non-ST elevated myocardial infarction) Active Problems:   Essential hypertension, malignant   Morbid obesity    SUBJECTIVE  No Cp or SOB> awaiting cath.   CURRENT MEDS . amLODipine  5 mg Oral Daily  . aspirin EC  81 mg Oral Daily  . atorvastatin  80 mg Oral q1800  . carvedilol  12.5 mg Oral BID WC  . fenofibrate  54 mg Oral Daily  . insulin aspart  0-20 Units Subcutaneous TID WC  . insulin aspart  0-5 Units Subcutaneous QHS  . insulin detemir  20 Units Subcutaneous QHS  . lisinopril  40 mg Oral Daily  . multivitamin with minerals  1 tablet Oral Daily  . omega-3 acid ethyl esters  1 g Oral Daily  . sertraline  150 mg Oral Daily    OBJECTIVE  Filed Vitals:   04/16/14 1340 04/16/14 1450 04/16/14 1950 04/17/14 0428  BP: 112/66 131/82 140/94 149/94  Pulse:  71 71 67  Temp:   98.4 F (36.9 C) 98.4 F (36.9 C)  TempSrc:   Oral Oral  Resp:  Height:      Weight:    255 lb 8.2 oz (115.9 kg)  SpO2:  98% 99% 98%    Intake/Output Summary (Last 24 hours) at 04/17/14 0805 Last data filed at 04/17/14 0600  Gross per 24 hour  Intake 1507.5 ml  Output    651 ml  Net  856.5 ml   Filed Weights   04/15/14 0346 04/16/14 0500 04/17/14 0428  Weight: 256 lb 9.9 oz (116.4 kg) 257 lb 15 oz (117 kg) 255 lb 8.2 oz (115.9 kg)    PHYSICAL EXAM  General: Pleasant, NAD. Neuro: Alert and oriented X 3. Moves all extremities spontaneously. Psych: Normal affect. HEENT:  Normal  Neck: Supple without bruits or JVD. Lungs:  Resp regular and unlabored, CTA. Heart: RRR no s3, s4, or murmurs. Abdomen: Soft, non-tender, non-distended, BS + x 4.  Extremities: No clubbing, cyanosis or edema. DP/PT/Radials 2+ and equal bilaterally.  Accessory Clinical Findings  CBC  Recent Labs  04/14/14 1745  04/16/14 0340 04/17/14 0705  WBC 10.6*  < > 8.9 8.9  NEUTROABS 7.4  --   --   --   HGB  13.3  < > 12.0 13.0  HCT 39.6  < > 36.0 38.8  MCV 89.2  < > 88.0 88.2  PLT 348  < > 325 317  < > = values in this interval not displayed. Basic Metabolic Panel  Recent Labs  04/14/14 1745 04/15/14 0319  NA 134* 133*  K 4.6 4.1  CL 96 96  CO2 22 21  GLUCOSE 333* 273*  BUN 16 13  CREATININE 0.65 0.54  CALCIUM 9.6 9.3    Cardiac Enzymes  Recent Labs  04/14/14 1811 04/16/14 0340  TROPONINI 1.05* 1.10*   Hemoglobin A1C  Recent Labs  04/15/14 0319  HGBA1C 11.0*   Fasting Lipid Panel  Recent Labs  04/15/14 0319  CHOL 270*  HDL 42  LDLCALC UNABLE TO CALCULATE IF TRIGLYCERIDE OVER 400 mg/dL  TRIG 782*  CHOLHDL 6.4   Thyroid Function Tests  Recent Labs  04/15/14 0319  TSH 2.760    TELE  NSR  Radiology/Studies  Dg Chest 2 View  04/14/2014   CLINICAL DATA:  Chest pain.  EXAM: CHEST  2 VIEW  COMPARISON:  Chest x-ray  04/14/2014.  FINDINGS: Lung volumes are normal. No consolidative airspace disease. No pleural effusions. No pneumothorax. No pulmonary nodule or mass noted. Pulmonary vasculature and the cardiomediastinal silhouette are within normal limits.  IMPRESSION: 1. No radiographic evidence of acute cardiopulmonary disease. 2. The previously noted lucency in the medial aspect of the lower left hemithorax has completely resolved on the standing PA and lateral views, indicative of an imaging artifact related to projection on the prior study. No findings to suggest pneumomediastinum at this time.   Electronically Signed   By: Daniel  Entrikin M.D.   On: 04/14/2014 20:13   Dg Chest Portable 1 View  04/14/2014   CLINICAL DATA:  Chest pain.  EXAM: PORTABLE CHEST - 1 VIEW  COMPARISON:  Chest x-ray 03/07/2009.  FINDINGS: Lung volumes are normal. No consolidative airspace disease. No pleural effusions. No pneumothorax. No pulmonary nodule or mass noted. Pulmonary vasculature and the cardiomediastinal silhouette are within normal limits, although there is an unusual  lucency in the a lower aspect of the left hemithorax prominently all lying the distal descending thoracic aorta.  IMPRESSION: Unusual lucency in the medial aspect of the lower left hemithorax. This may simply be projectional (which is favored), however, if there is any clinical concern for pneumomediastinum, repeat standing PA and lateral chest x-ray is recommended at this time, as these findings could be seen in the setting of pneumomediastinum.   Electronically Signed   By: Daniel  Entrikin M.D.   On: 04/14/2014 19:34    ASSESSMENT AND PLAN  Barbara Jacobs is a 43 y.o with morbid obesity, HTN, DM, HL and CAD. She is s/p inferior STEMI in 03/2009. She had BMS to proximal PDA. Admitted 9/11 with NSTEMI (trop 1.05 on admit) and severe HTN. ECG with progressive lateral TWI.   NSTEMI - with known hx of CAD s/p inferior STEMI 2010. Peak troponin 1.10. No CP currently -- Cont ASA, high-dose statin, b-blocker and heparin.  -- Planned for cath. I placed orders and put on cath board. LHC with Dr. Cooper at 3 pm. Remain NPO  Severe hypertriglyceridemia Triglycerides > 700.  --Severe hyperTG due to uncontrolled DM2/metabolic syndrome. Now on high-dose statin/fibrate.   Malignant HTN -BP improved.   Morbid obesity - diet and exercise  DM2, uncontrolled - HgB A1c 11.0  on metformin at home. Holding for cath  -- Started insulin. Pharmacy to help dose. DM2 teaching consult ordered.  Medication noncompliance    Signed, THOMPSON, KATHRYN R PA-C  Pager [phone]  I have seen and examined the patient along with THOMPSON, KATHRYN R PA-C.  I have reviewed the chart, notes and new data.  I agree with PA's note.  Key new complaints: angina free now, nervous about waiting for cath Key examination changes: no CHF on exam, no arrhythmia Key new findings / data: peak Troponin 1.07 and trending down  PLAN: Coronary angio and possible PCI today.This procedure has been fully reviewed with the patient and written  informed consent has been obtained.   Casper Pagliuca, MD, FACC [facility] [phone] 04/17/2014, 8:49 AM  

## 2014-04-17 NOTE — H&P (View-Only) (Signed)
Patient Name: Barbara Jacobs Date of Encounter: 04/17/2014     Principal Problem:   NSTEMI (non-ST elevated myocardial infarction) Active Problems:   Essential hypertension, malignant   Morbid obesity    SUBJECTIVE  No Cp or SOB> awaiting cath.   CURRENT MEDS . amLODipine  5 mg Oral Daily  . aspirin EC  81 mg Oral Daily  . atorvastatin  80 mg Oral q1800  . carvedilol  12.5 mg Oral BID WC  . fenofibrate  54 mg Oral Daily  . insulin aspart  0-20 Units Subcutaneous TID WC  . insulin aspart  0-5 Units Subcutaneous QHS  . insulin detemir  20 Units Subcutaneous QHS  . lisinopril  40 mg Oral Daily  . multivitamin with minerals  1 tablet Oral Daily  . omega-3 acid ethyl esters  1 g Oral Daily  . sertraline  150 mg Oral Daily    OBJECTIVE  Filed Vitals:   04/16/14 1340 04/16/14 1450 04/16/14 1950 04/17/14 0428  BP: 112/66 131/82 140/94 149/94  Pulse:  71 71 67  Temp:   98.4 F (36.9 C) 98.4 F (36.9 C)  TempSrc:   Oral Oral  Resp:  Height:      Weight:    255 lb 8.2 oz (115.9 kg)  SpO2:  98% 99% 98%    Intake/Output Summary (Last 24 hours) at 04/17/14 0805 Last data filed at 04/17/14 0600  Gross per 24 hour  Intake 1507.5 ml  Output    651 ml  Net  856.5 ml   Filed Weights   04/15/14 0346 04/16/14 0500 04/17/14 0428  Weight: 256 lb 9.9 oz (116.4 kg) 257 lb 15 oz (117 kg) 255 lb 8.2 oz (115.9 kg)    PHYSICAL EXAM  General: Pleasant, NAD. Neuro: Alert and oriented X 3. Moves all extremities spontaneously. Psych: Normal affect. HEENT:  Normal  Neck: Supple without bruits or JVD. Lungs:  Resp regular and unlabored, CTA. Heart: RRR no s3, s4, or murmurs. Abdomen: Soft, non-tender, non-distended, BS + x 4.  Extremities: No clubbing, cyanosis or edema. DP/PT/Radials 2+ and equal bilaterally.  Accessory Clinical Findings  CBC  Recent Labs  04/14/14 1745  04/16/14 0340 04/17/14 0705  WBC 10.6*  < > 8.9 8.9  NEUTROABS 7.4  --   --   --   HGB  13.3  < > 12.0 13.0  HCT 39.6  < > 36.0 38.8  MCV 89.2  < > 88.0 88.2  PLT 348  < > 325 317  < > = values in this interval not displayed. Basic Metabolic Panel  Recent Labs  04/14/14 1745 04/15/14 0319  NA 134* 133*  K 4.6 4.1  CL 96 96  CO2 22 21  GLUCOSE 333* 273*  BUN 16 13  CREATININE 0.65 0.54  CALCIUM 9.6 9.3    Cardiac Enzymes  Recent Labs  04/14/14 1811 04/16/14 0340  TROPONINI 1.05* 1.10*   Hemoglobin A1C  Recent Labs  04/15/14 0319  HGBA1C 11.0*   Fasting Lipid Panel  Recent Labs  04/15/14 0319  CHOL 270*  HDL 42  LDLCALC UNABLE TO CALCULATE IF TRIGLYCERIDE OVER 400 mg/dL  TRIG 782*  CHOLHDL 6.4   Thyroid Function Tests  Recent Labs  04/15/14 0319  TSH 2.760    TELE  NSR  Radiology/Studies  Dg Chest 2 View  04/14/2014   CLINICAL DATA:  Chest pain.  EXAM: CHEST  2 VIEW  COMPARISON:  Chest x-ray  04/14/2014.  FINDINGS: Lung volumes are normal. No consolidative airspace disease. No pleural effusions. No pneumothorax. No pulmonary nodule or mass noted. Pulmonary vasculature and the cardiomediastinal silhouette are within normal limits.  IMPRESSION: 1. No radiographic evidence of acute cardiopulmonary disease. 2. The previously noted lucency in the medial aspect of the lower left hemithorax has completely resolved on the standing PA and lateral views, indicative of an imaging artifact related to projection on the prior study. No findings to suggest pneumomediastinum at this time.   Electronically Signed   By: Trudie Reed M.D.   On: 04/14/2014 20:13   Dg Chest Portable 1 View  04/14/2014   CLINICAL DATA:  Chest pain.  EXAM: PORTABLE CHEST - 1 VIEW  COMPARISON:  Chest x-ray 03/07/2009.  FINDINGS: Lung volumes are normal. No consolidative airspace disease. No pleural effusions. No pneumothorax. No pulmonary nodule or mass noted. Pulmonary vasculature and the cardiomediastinal silhouette are within normal limits, although there is an unusual  lucency in the a lower aspect of the left hemithorax prominently all lying the distal descending thoracic aorta.  IMPRESSION: Unusual lucency in the medial aspect of the lower left hemithorax. This may simply be projectional (which is favored), however, if there is any clinical concern for pneumomediastinum, repeat standing PA and lateral chest x-ray is recommended at this time, as these findings could be seen in the setting of pneumomediastinum.   Electronically Signed   By: Trudie Reed M.D.   On: 04/14/2014 19:34    ASSESSMENT AND PLAN  Barbara Jacobs is a 43 y.o with morbid obesity, HTN, DM, HL and CAD. She is s/p inferior STEMI in 03/2009. She had BMS to proximal PDA. Admitted 9/11 with NSTEMI (trop 1.05 on admit) and severe HTN. ECG with progressive lateral TWI.   NSTEMI - with known hx of CAD s/p inferior STEMI 2010. Peak troponin 1.10. No CP currently -- Cont ASA, high-dose statin, b-blocker and heparin.  -- Planned for cath. I placed orders and put on cath board. LHC with Dr. Excell Seltzer at 3 pm. Remain NPO  Severe hypertriglyceridemia Triglycerides > 700.  --Severe hyperTG due to uncontrolled DM2/metabolic syndrome. Now on high-dose statin/fibrate.   Malignant HTN -BP improved.   Morbid obesity - diet and exercise  DM2, uncontrolled - HgB A1c 11.0  on metformin at home. Holding for cath  -- Started insulin. Pharmacy to help dose. DM2 teaching consult ordered.  Medication noncompliance    Signed, Janetta Hora PA-C  Pager [phone]  I have seen and examined the patient along with Cline Crock R PA-C.  I have reviewed the chart, notes and new data.  I agree with PA's note.  Key new complaints: angina free now, nervous about waiting for cath Key examination changes: no CHF on exam, no arrhythmia Key new findings / data: peak Troponin 1.07 and trending down  PLAN: Coronary angio and possible PCI today.This procedure has been fully reviewed with the patient and written  informed consent has been obtained.   Barbara Fair, MD, [facility] [phone] 04/17/2014, 8:49 AM

## 2014-04-17 NOTE — Interval H&P Note (Signed)
History and Physical Interval Note:  04/17/2014 5:14 PM  Barbara Jacobs  has presented today for surgery, with the diagnosis of cp  The various methods of treatment have been discussed with the patient and family. After consideration of risks, benefits and other options for treatment, the patient has consented to  Procedure(s): LEFT HEART CATHETERIZATION WITH CORONARY ANGIOGRAM (N/A) as a surgical intervention .  The patient's history has been reviewed, patient examined, no change in status, stable for surgery.  I have reviewed the patient's chart and labs.  Questions were answered to the patient's satisfaction.    Cath Lab Visit (complete for each Cath Lab visit)  Clinical Evaluation Leading to the Procedure:   ACS: Yes.    Non-ACS:    Anginal Classification: CCS IV  Anti-ischemic medical therapy: Maximal Therapy (2 or more classes of medications)  Non-Invasive Test Results: No non-invasive testing performed  Prior CABG: No previous CABG       Tonny Bollman

## 2014-04-17 NOTE — CV Procedure (Signed)
    Cardiac Catheterization Procedure Note  Name: Barbara Jacobs MRN: [id number] DOB: 04-28-1971  Procedure: Left Heart Cath, Selective Coronary Angiography, LV angiography  Indication: NSTEMI   Procedural Details: The right wrist was prepped, draped, and anesthetized with 1% lidocaine. Using the modified Seldinger technique, a 5/6 French Slender sheath was introduced into the right radial artery. 3 mg of verapamil was administered through the sheath, weight-based unfractionated heparin was administered intravenously. Standard Judkins catheters were used for selective coronary angiography and left ventriculography. Catheter exchanges were performed over an exchange length guidewire. There were no immediate procedural complications. A TR band was used for radial hemostasis at the completion of the procedure.  The patient was transferred to the post catheterization recovery area for further monitoring.  Procedural Findings: Hemodynamics: AO 134/78 LV 133/16  Coronary angiography: Coronary dominance: right  Left mainstem: The left main is patent without obstructive disease.  Left anterior descending (LAD): The LAD has diffuse irregularity and wraps around the LV apex. The first diagonal is widely patent. The first diagonal is a large vessel that divides into twin vessels and supplies an intermediate branch territory. No obstructive disease noted.  Left circumflex (LCx): The circumflex is patent. The vessel is relatively small and supplies a small OM branch.  Right coronary artery (RCA): The RCA is dominant. The vessel has mild diffuse disease. The stented segment in the PDA is mild in-stent restenosis of no more than 20-30%. There is no high-grade obstruction throughout the RCA distribution  Left ventriculography: The basal inferior wall is hypokinetic. The other LV wall segments contract normally. The LVEF is preserved and estimated at 60%.  Estimated Blood Loss: minimal  Final  Conclusions:   1. Patent PDA stent 2. Mild diffuse CAD (nonobstructive) 3. Basal inferior hypokinesis with preserved overall LVEF  Recommendations: No clear coronary etiology to explain elevated troponin. Diff Dx includes coronary spasm, hypertensive emergency as most likely etiologies.  Tonny Bollman MD, [facility] 04/17/2014, 5:50 PM

## 2014-04-17 NOTE — Progress Notes (Signed)
TR band removed and dressing applied per protocol.  No signs of bleeding. VSS.  Will continue to monitor closely.  Pt resting with call bell in reach.

## 2014-04-17 NOTE — Progress Notes (Signed)
Inpatient Diabetes Program Recommendations  AACE/ADA: New Consensus Statement on Inpatient Glycemic Control (2013)  Target Ranges:  Prepandial:   less than 140 mg/dL      Peak postprandial:   less than 180 mg/dL (1-2 hours)      Critically ill patients:  140 - 180 mg/dL   This coordinator met with patient to discuss recent A1C 11 and diabetes management at home.  Patient reports that she is supposed to be taking Metformin and Victoza but has not been taking for approximately 3 months.  Patient said she was consistent with meds for approximately 7 months and then "life happened".  Patient is confident that she will return to her previous regimen and adhere.  Patient reports that her CBGs were much improved when she was taking the Victoza and Metformin as prescribed.  Pt reports she does have a glucose monitor at home.  No further questions/concerns at the end of our visit.   Thank you  Raoul Pitch BSN, RN,CDE Inpatient Diabetes Coordinator [phone] (team pager)

## 2014-04-18 ENCOUNTER — Encounter: Payer: Self-pay | Admitting: Cardiology

## 2014-04-18 ENCOUNTER — Telehealth: Payer: Self-pay

## 2014-04-18 LAB — BASIC METABOLIC PANEL
ANION GAP: 12 (ref 5–15)
BUN: 9 mg/dL (ref 6–23)
CHLORIDE: 101 meq/L (ref 96–112)
CO2: 23 mEq/L (ref 19–32)
Calcium: 9.2 mg/dL (ref 8.4–10.5)
Creatinine, Ser: 0.61 mg/dL (ref 0.50–1.10)
Glucose, Bld: 200 mg/dL — ABNORMAL HIGH (ref 70–99)
POTASSIUM: 4.2 meq/L (ref 3.7–5.3)
Sodium: 136 mEq/L — ABNORMAL LOW (ref 137–147)

## 2014-04-18 LAB — CBC
HCT: 36.9 % (ref 36.0–46.0)
Hemoglobin: 12.1 g/dL (ref 12.0–15.0)
MCH: 29.2 pg (ref 26.0–34.0)
MCHC: 32.8 g/dL (ref 30.0–36.0)
MCV: 88.9 fL (ref 78.0–100.0)
Platelets: 308 10*3/uL (ref 150–400)
RBC: 4.15 MIL/uL (ref 3.87–5.11)
RDW: 12.2 % (ref 11.5–15.5)
WBC: 7.6 10*3/uL (ref 4.0–10.5)

## 2014-04-18 LAB — GLUCOSE, CAPILLARY: Glucose-Capillary: 206 mg/dL — ABNORMAL HIGH (ref 70–99)

## 2014-04-18 MED ORDER — CLOPIDOGREL BISULFATE 75 MG PO TABS: 75.0000 mg | ORAL_TABLET | Freq: Every day | ORAL | Status: DC

## 2014-04-18 MED ORDER — AMLODIPINE BESYLATE 5 MG PO TABS: 5.0000 mg | ORAL_TABLET | Freq: Every day | ORAL | Status: DC

## 2014-04-18 MED ORDER — METFORMIN HCL 500 MG PO TABS: 500.0000 mg | ORAL_TABLET | Freq: Two times a day (BID) | ORAL | Status: DC

## 2014-04-18 MED ORDER — ASPIRIN 81 MG PO TBEC: 81.0000 mg | DELAYED_RELEASE_TABLET | Freq: Every day | ORAL | Status: DC

## 2014-04-18 MED ORDER — OMEGA-3-ACID ETHYL ESTERS 1 G PO CAPS: 1.0000 g | ORAL_CAPSULE | Freq: Every day | ORAL | Status: DC

## 2014-04-18 MED ORDER — CARVEDILOL 12.5 MG PO TABS: 12.5000 mg | ORAL_TABLET | Freq: Two times a day (BID) | ORAL | Status: DC

## 2014-04-18 MED ORDER — ATORVASTATIN CALCIUM 80 MG PO TABS: 80.0000 mg | ORAL_TABLET | Freq: Every day | ORAL | Status: DC

## 2014-04-18 MED ORDER — FENOFIBRATE 54 MG PO TABS: 54.0000 mg | ORAL_TABLET | Freq: Every day | ORAL | Status: DC

## 2014-04-18 MED ORDER — LIRAGLUTIDE 18 MG/3ML ~~LOC~~ SOPN: 0.6000 mg | PEN_INJECTOR | Freq: Every morning | SUBCUTANEOUS | Status: DC

## 2014-04-18 MED ORDER — LISINOPRIL 40 MG PO TABS: 40.0000 mg | ORAL_TABLET | Freq: Every day | ORAL | Status: DC

## 2014-04-18 NOTE — Progress Notes (Signed)
I have seen and examined the patient along with B. Sharol Harness, PA C.  I have reviewed the chart, notes and new data.  I agree with PA's note.  Key new complaints: no angina Key examination changes: healthy radial access site Key new findings / data: cath with benign findings.  PLAN: Clopidogrel x 12 months. DC home.  Thurmon Fair, MD, [facility] [phone] 04/18/2014, 9:23 AM

## 2014-04-18 NOTE — Care Management Note (Signed)
    Page 1 of 1   04/18/2014     3:32:58 PM CARE MANAGEMENT NOTE 04/18/2014  Patient:  Barbara Jacobs, Barbara Jacobs   Account Number:  [id number]  Date Initiated:  04/18/2014  Documentation initiated by:  Mirela Parsley  Subjective/Objective Assessment:   Pt adm on 04/14/14 with NSTEMI.  PTA, pt independent of ADLS.     Action/Plan:   Pt for dc home today; no dc needs identitified.   Anticipated DC Date:  04/18/2014   Anticipated DC Plan:  HOME/SELF CARE      DC Planning Services  CM consult      Choice offered to / List presented to:             Status of service:  Completed, signed off Medicare Important Message given?   (If response is "NO", the following Medicare IM given date fields will be blank) Date Medicare IM given:   Medicare IM given by:   Date Additional Medicare IM given:   Additional Medicare IM given by:    Discharge Disposition:  HOME/SELF CARE  Per UR Regulation:  Reviewed for med. necessity/level of care/duration of stay  If discussed at Long Length of Stay Meetings, dates discussed:    Comments:

## 2014-04-18 NOTE — Discharge Instructions (Signed)
Continue to hold your Metformin until 04/19/14  Restarting Victoza: Start at 0.6 mg once daily x 1 week. If tolerating ok, increase to 1.2 mg daily.   You will need to follow-up with your primary doctor within 2 weeks.

## 2014-04-18 NOTE — Progress Notes (Signed)
Discharge education, medication, prescriptions, and follow-up appts given to pt and reviewed, pt stated understanding and that she had no questions, [place] PA paged for work note, IV and tele removed Archie Balboa, RN

## 2014-04-18 NOTE — Progress Notes (Signed)
[id number] Cardiac Rehab Noted that pt was for discharge. Completed discharge education with pt. She voices understanding. Pt declines Outpt. CRP due to her work schedule. Pt admits to not taking her medications prior to coming to the hospital. She states that it has been due to taking care of her father and her aunt and not herself. Pt plans to her her medications everyday from this point forward. Beatrix Fetters, RN 04/18/2014 12:22 PM

## 2014-04-18 NOTE — Progress Notes (Signed)
Patient Profile: Barbara Jacobs is a 43 y.o. female who presented for evaluation of chest pain. She has known hx of CAD, s/p Acute inferior STEMI in 03/2009. She had BMS to proximal PDA. She also has HTN, DM, dyslipidemia and obesity.   Ruled in for NSTEMI.    Subjective: No further chest pain. Denies dyspnea. Right radial access ok.  Objective: Vital signs in last 24 hours: Temp:  [98 F (36.7 C)-98.2 F (36.8 C)] 98.2 F (36.8 C) (09/15 0641) Pulse Rate:  [66-79] 70 (09/15 0641) Resp:  [16-18] 17 (09/15 0641) BP: (120-139)/(61-82) 123/75 mmHg (09/15 0641) SpO2:  [97 %-100 %] 97 % (09/15 0641) Weight:  [254 lb (115.214 kg)] 254 lb (115.214 kg) (09/15 0500) Last BM Date: 04/16/14  Intake/Output from previous day: 09/14 0701 - 09/15 0700 In: 840 [P.O.:840] Out: 2200 [Urine:2200] Intake/Output this shift:    Medications Current Facility-Administered Medications  Medication Dose Route Frequency Provider Last Rate Last Dose  . 0.9 %  sodium chloride infusion  250 mL Intravenous PRN Janetta Hora, PA-C      . 0.9 %  sodium chloride infusion  1 mL/kg/hr Intravenous Continuous Janetta Hora, PA-C      . acetaminophen (TYLENOL) tablet 650 mg  650 mg Oral Q4H PRN Lenon Oms, MD   650 mg at 04/15/14 0639  . amLODipine (NORVASC) tablet 5 mg  5 mg Oral Daily Dolores Patty, MD   5 mg at 04/17/14 2154  . aspirin EC tablet 81 mg  81 mg Oral Daily Lenon Oms, MD   81 mg at 04/17/14 1018  . atorvastatin (LIPITOR) tablet 80 mg  80 mg Oral q1800 Lenon Oms, MD   80 mg at 04/17/14 2154  . carvedilol (COREG) tablet 12.5 mg  12.5 mg Oral BID WC Dolores Patty, MD   12.5 mg at 04/18/14 0738  . fenofibrate tablet 54 mg  54 mg Oral Daily Dolores Patty, MD   54 mg at 04/17/14 2154  . insulin aspart (novoLOG) injection 0-20 Units  0-20 Units Subcutaneous TID WC Dolores Patty, MD   7 Units at 04/18/14 [phone]  . insulin aspart (novoLOG) injection 0-5 Units   0-5 Units Subcutaneous QHS Dolores Patty, MD   2 Units at 04/17/14 2155  . insulin detemir (LEVEMIR) injection 20 Units  20 Units Subcutaneous QHS Dolores Patty, MD   20 Units at 04/17/14 2155  . labetalol (NORMODYNE,TRANDATE) injection 10 mg  10 mg Intravenous Q4H PRN Dolores Patty, MD      . lisinopril (PRINIVIL,ZESTRIL) tablet 40 mg  40 mg Oral Daily Dolores Patty, MD   40 mg at 04/17/14 1216  . multivitamin with minerals tablet 1 tablet  1 tablet Oral Daily Lenon Oms, MD   1 tablet at 04/17/14 2154  . nitroGLYCERIN (NITROSTAT) SL tablet 0.4 mg  0.4 mg Sublingual Q5 Min x 3 PRN Lenon Oms, MD      . omega-3 acid ethyl esters (LOVAZA) capsule 1 g  1 g Oral Daily Lenon Oms, MD   1 g at 04/16/14 1023  . ondansetron (ZOFRAN) injection 4 mg  4 mg Intravenous Q6H PRN Lenon Oms, MD      . sertraline (ZOLOFT) tablet 150 mg  150 mg Oral Daily Lenon Oms, MD   150 mg at 04/17/14 2153  . sodium chloride 0.9 % injection 3 mL  3 mL Intravenous Q12H Janetta Hora, PA-C      .  sodium chloride 0.9 % injection 3 mL  3 mL Intravenous PRN Janetta Hora, PA-C      . traMADol Janean Sark) tablet 50 mg  50 mg Oral Q6H PRN Dolores Patty, MD      . zolpidem (AMBIEN) tablet 5 mg  5 mg Oral QHS PRN Thurmon Fair, MD   5 mg at 04/17/14 2155    PE: General appearance: alert, cooperative, no distress and moderately obese Neck: no carotid bruit and no JVD Lungs: clear to auscultation bilaterally Heart: regular rate and rhythm, S1, S2 normal, no murmur, click, rub or gallop Extremities: no LEE; right radial free from hematoma Pulses: 2+ and symmetric Skin: warm and dry Neurologic: Grossly normal  Lab Results:   Recent Labs  04/16/14 0340 04/17/14 0705 04/18/14 0339  WBC 8.9 8.9 7.6  HGB 12.0 13.0 12.1  HCT 36.0 38.8 36.9  PLT 325 317 308   BMET  Recent Labs  04/17/14 0705 04/18/14 0339  NA 135* 136*  K 4.6 4.2  CL 101 101  CO2 21  23  GLUCOSE 176* 200*  BUN 8 9  CREATININE 0.51 0.61  CALCIUM 9.3 9.2   PT/INR No results found for this basename: LABPROT, INR,  in the last 72 hours Lipid Panel     Component Value Date/Time   CHOL 270* 04/15/2014 0319   TRIG 748* 04/15/2014 0319   HDL 42 04/15/2014 0319   CHOLHDL 6.4 04/15/2014 0319   VLDL UNABLE TO CALCULATE IF TRIGLYCERIDE OVER 400 mg/dL 1/61/0960 4540   LDLCALC UNABLE TO CALCULATE IF TRIGLYCERIDE OVER 400 mg/dL 9/81/1914 7829     Cardiac Enzymes Cardiac Panel (last 3 results)  Recent Labs  04/16/14 0340  TROPONINI 1.10*    Studies/Results:  LHC 04/17/14 Final Conclusions:  1. Patent PDA stent  2. Mild diffuse CAD (nonobstructive)  3. Basal inferior hypokinesis with preserved overall LVEF   Assessment/Plan  Principal Problem:   NSTEMI (non-ST elevated myocardial infarction) Active Problems:   Essential hypertension, malignant   Morbid obesity  1. NSTEMI: LHC revealed patent PDA stent and mild, diffuse, nonobstructive CAD. Persevered EF. No clear coronary etiology to explain elevated troponin. ? Coronary spasm or hypertensive emergency. She denies further CP. Continue medical management. Will add Plavix. Resume ASA, Coreg, lisinopril, amlodipine and high dose Lipitor.    2. HTN: Well controlled. Continue ACE, BB and CCB.   3. Hyperlipidemia: Triglycerides severely elevated at 748. LDL could not be calculated. Continue high dose Lipitor. She will need recheck fasting lipid panel and LFTs as an OP in several weeks.   4. Diabetes: Poorly controlled. Hgb A1c is 11. Goal is <7. Continue current regimen. Appreciate recs from Diabetes Coordinator. Continue Metformin and Victoza as an outpatient. Will continue to hold Metformin for an additional 24-36 hrs given recent LHC. She will need close f/u with her PCP.   5. Post Cath: right radial cath site free from complication with 2+ radial pulse. Continue to hold Metformin for an additional 24-36 hr.    Dispo: discharge home today. F/u with Dr.    Alonna Buckler: 4 days    Roxy Horseman. Sharol Harness, PA-C 04/18/2014 8:30 AM

## 2014-04-18 NOTE — Telephone Encounter (Signed)
Admit date: 04/14/2014  Discharge date: 04/18/2014  Called patient to schedule hospital follow up and to complete TCM call.  No answer.  Unable to leave voice message due to mailbox not being set up.  Will call again tomorrow.

## 2014-04-18 NOTE — Discharge Summary (Signed)
Physician Discharge Summary  Patient ID: Barbara Jacobs MRN: [id number] DOB/AGE: 43/11/1970 43 y.o.  Primary Cardiologist: Dr. Katrinka Blazing  Admit date: 04/14/2014 Discharge date: 04/18/2014  Admission Diagnoses: NSTEMI   Discharge Diagnoses:  Principal Problem:   NSTEMI (non-ST elevated myocardial infarction) Active Problems:   Hyperlipidemia   DM (diabetes mellitus)   Essential hypertension, malignant   Morbid obesity   Discharged Condition: stable  Hospital Course: The patient is a 43 y/o female with a history of CAD, s/p acute inferior STEMI in 03/2009, resulting in a  BMS to the proximal PDA. She also has HTN, poorly controlled T2DM, dyslipidemia and obesity. She presented to [facility] on 04/14/14 with complaints of left sided chest pain with radiation down the left arm. This was in the setting of medication noncompliance. On arrival to the ED, her BP was elevated at 178/108. EKG demonstrated nonspecific Twave abnormalities. POC troponin was elevated at 0.53. Symptoms were relived in the ED with ASA and NTG. IV heparin was also initiated. She was admitted. Actual lab troponin's were cycle and were also positive, peaking at 1.10. Labs were also notable for severely elevated Hgb A1c at 11 and severely elevated triglycerides at 748 mg/dL. LDL could not be calculated. A diabetes coordinator was consulted. She was placed on ss insulin (was formerly on Metformin and Victoza as an outpatient but was noncompliant for the last 6 months).  High dose Lipitor was initiated as well as fenofibrate. She was also placed on carvedilol and lisinopril and her BP improved.   On 04/17/14, she underwent a diagnostic LHC. The procedure was performed by Dr. Excell Seltzer via the right radial artery.  This demonstrated persevered EF at 60% and continued patency of the previously placed PDA stent. She was noted to have mild diffuse nonobstructive CAD, but no clear coronary etiology to explain her elevated troponin. It was felt that  either coronary spasm or hypertensive emergency were most likely. Continued medical therapy with agressive risk factor modification was recommended. Amlodipine was added to her medical regimen along with Plavix. Her Prilosec was subsequently discontinued and replaced with Protonix to avoid interaction with Plavix.  The diabetes coordinator recommended restarting Victoza and Metformin as an outpatient with close f/u with her PCP. Metformin was held > 48 hrs post cath. She had no post cath complications. The right radial access site remained stable and free of complication with a 2+ radial pulse. She denied any further chest pain. Her BP stabilized and was well controlled in the 120s systolic. She was last seen and examined by Dr. Royann Shivers, who determined she was stable for discharge home. She is scheduled for post hospital f/u with Norma Fredrickson, NP, on 05/09/14. She will continue to be followed long term by Dr. Katrinka Blazing.    Consults: Diabetes Coordinator  Significant Diagnostic Studies:   [facility] 04/17/14 Procedural Findings:  Hemodynamics:  AO 134/78  LV 133/16  Coronary angiography:  Coronary dominance: right  Left mainstem: The left main is patent without obstructive disease.  Left anterior descending (LAD): The LAD has diffuse irregularity and wraps around the LV apex. The first diagonal is widely patent. The first diagonal is a large vessel that divides into twin vessels and supplies an intermediate branch territory. No obstructive disease noted.  Left circumflex (LCx): The circumflex is patent. The vessel is relatively small and supplies a small OM branch.  Right coronary artery (RCA): The RCA is dominant. The vessel has mild diffuse disease. The stented segment in the PDA is mild in-stent  restenosis of no more than 20-30%. There is no high-grade obstruction throughout the RCA distribution  Left ventriculography: The basal inferior wall is hypokinetic. The other LV wall segments contract normally. The  LVEF is preserved and estimated at 60%.    Treatments: See Hospital Course  Discharge Exam: Blood pressure 125/81, pulse 66, temperature 98.2 F (36.8 C), temperature source Oral, resp. rate 17, height  (1.651 m), weight 254 lb (115.214 kg), last menstrual period 03/24/2014, SpO2 97.00%.   Disposition: 01-Home or Self Care      Discharge Instructions   Diet - low sodium heart healthy    Complete by:  As directed      Diet - low sodium heart healthy    Complete by:  As directed      Increase activity slowly    Complete by:  As directed      Increase activity slowly    Complete by:  As directed             Medication List    STOP taking these medications       fish oil-omega-3 fatty acids 1000 MG capsule  Replaced by:  omega-3 acid ethyl esters 1 G capsule     lisinopril-hydrochlorothiazide 20-25 MG per tablet  Commonly known as:  PRINZIDE,ZESTORETIC      TAKE these medications       amLODipine 5 MG tablet  Commonly known as:  NORVASC  Take 1 tablet (5 mg total) by mouth daily.     aspirin 81 MG EC tablet  Take 1 tablet (81 mg total) by mouth daily.     atorvastatin 80 MG tablet  Commonly known as:  LIPITOR  Take 1 tablet (80 mg total) by mouth daily at 6 PM.     carvedilol 12.5 MG tablet  Commonly known as:  COREG  Take 1 tablet (12.5 mg total) by mouth 2 (two) times daily with a meal.     clopidogrel 75 MG tablet  Commonly known as:  PLAVIX  Take 1 tablet (75 mg total) by mouth daily.     fenofibrate 54 MG tablet  Take 1 tablet (54 mg total) by mouth daily.     Liraglutide 18 MG/3ML Sopn  Commonly known as:  VICTOZA  Inject 0.6 mg into the skin every morning.     lisinopril 40 MG tablet  Commonly known as:  PRINIVIL,ZESTRIL  Take 1 tablet (40 mg total) by mouth daily.     metFORMIN 500 MG tablet  Commonly known as:  GLUCOPHAGE  Take 1 tablet (500 mg total) by mouth 2 (two) times daily with a meal.  Start taking on:  04/19/2014      multivitamin with minerals tablet  Take 1 tablet by mouth daily.     omega-3 acid ethyl esters 1 G capsule  Commonly known as:  LOVAZA  Take 1 capsule (1 g total) by mouth daily.     sertraline 100 MG tablet  Commonly known as:  ZOLOFT  Take 150 mg by mouth daily.       Follow-up Information   Follow up with Norma Fredrickson, NP On 05/09/2014. (8:30 am (Dr. Michaelle Copas NP))    Specialty:  Nurse Practitioner   Contact information:   [address] [place] [address] [phone]       Follow up with Neena Rhymes, MD. Schedule an appointment as soon as possible for a visit in 1 week. (for follow up on your diabetes)  Specialty:  Family Medicine   Contact information:   [address] [place] [address] [phone]      TIME SPENT ON DISCHARGE, INCLUDING PHYSICIAN TIME: >30 MINUTES  Signed: Robbie Lis 04/18/2014, 12:04 PM

## 2014-04-19 NOTE — Telephone Encounter (Signed)
Left a message for call back.  

## 2014-04-21 NOTE — Telephone Encounter (Signed)
Called both numbers listed.  No answer. Unable to leave a voice message of either phone.  Mailbox is full.

## 2014-04-24 NOTE — Telephone Encounter (Signed)
Called. No answer. Unable to leave message. Mailbox full.

## 2014-05-03 NOTE — Telephone Encounter (Signed)
Unable to reach patient.  Closing encounter.

## 2014-05-04 ENCOUNTER — Encounter: Payer: Self-pay | Admitting: Physician Assistant

## 2014-05-04 ENCOUNTER — Ambulatory Visit (INDEPENDENT_AMBULATORY_CARE_PROVIDER_SITE_OTHER): Payer: BC Managed Care – PPO | Admitting: Physician Assistant

## 2014-05-04 VITALS — BP 140/100 | HR 85 | Ht 65.0 in | Wt 254.0 lb

## 2014-05-04 DIAGNOSIS — I10 Essential (primary) hypertension: Secondary | ICD-10-CM

## 2014-05-04 DIAGNOSIS — I2510 Atherosclerotic heart disease of native coronary artery without angina pectoris: Secondary | ICD-10-CM

## 2014-05-04 DIAGNOSIS — E785 Hyperlipidemia, unspecified: Secondary | ICD-10-CM

## 2014-05-04 DIAGNOSIS — E1165 Type 2 diabetes mellitus with hyperglycemia: Secondary | ICD-10-CM

## 2014-05-04 DIAGNOSIS — K219 Gastro-esophageal reflux disease without esophagitis: Secondary | ICD-10-CM

## 2014-05-04 DIAGNOSIS — IMO0002 Reserved for concepts with insufficient information to code with codable children: Secondary | ICD-10-CM

## 2014-05-04 HISTORY — DX: Atherosclerotic heart disease of native coronary artery without angina pectoris: I25.10

## 2014-05-04 LAB — COMPREHENSIVE METABOLIC PANEL
ALBUMIN: 3.8 g/dL (ref 3.5–5.2)
ALK PHOS: 68 U/L (ref 39–117)
ALT: 10 U/L (ref 0–35)
AST: 14 U/L (ref 0–37)
BUN: 15 mg/dL (ref 6–23)
CO2: 26 mEq/L (ref 19–32)
Calcium: 9.6 mg/dL (ref 8.4–10.5)
Chloride: 100 mEq/L (ref 96–112)
Creatinine, Ser: 0.9 mg/dL (ref 0.4–1.2)
GFR: 89.9 mL/min (ref >60.00)
Glucose, Bld: 180 mg/dL — ABNORMAL HIGH (ref 70–99)
POTASSIUM: 4.2 meq/L (ref 3.5–5.1)
SODIUM: 134 meq/L — AB (ref 135–145)
Total Bilirubin: 0.2 mg/dL (ref 0.2–1.2)
Total Protein: 7.9 g/dL (ref 6.0–8.3)

## 2014-05-04 MED ORDER — ASPIRIN EC 81 MG PO TBEC: 81.0000 mg | DELAYED_RELEASE_TABLET | Freq: Every day | ORAL | Status: DC

## 2014-05-04 MED ORDER — FAMOTIDINE 20 MG PO TABS: 20.0000 mg | ORAL_TABLET | Freq: Two times a day (BID) | ORAL | Status: DC

## 2014-05-04 NOTE — Patient Instructions (Signed)
Your physician has recommended you make the following change in your medication:  1) REDUCE Aspirin to 81mg  daily 2) START Famotidine (Pepcid) 20mg  twice daily. An Rx has been sent to your pharmacy  Lab Today: Cmet  Your physician recommends that you return for a FASTING lipid profile and lft in 6 weeks  Your physician recommends that you schedule a follow-up appointment in: 6 weeks with Dr.Smith/ PA,NP

## 2014-05-04 NOTE — Progress Notes (Signed)
[address] [place], [place]  [address] Phone: [phone] Fax:  [phone]  Date:  05/04/2014   Patient ID:  Barbara Jacobs, DOB [birth year], MRN [id number]   PCP:  Neena RhymesKatherine Tabori, MD  Cardiologist:  Dr. Katrinka BlazingSmith   History of Present Illness: Barbara Jacobs is a 43 y.o. female with history of CAD (inferior STEMI 2010 s/p/ BMS to prox PDA, recent NSTEMI 04/2014 without culprit), HTN, DM, HL, and morbid obesity. She was recently admitted 9/11-9/15 for chest pain and elevated troponin up to 1.1. During that admission, A1c 11, trigs 748. She had been noncompliant with medications recently. LHC showed patent PDA stent, mild diffuse nonobstructive CAD, basal inferior hypokinesis with preserved overall LVEF. No culprit lesion was identified. Differential diagnosis included coronary spasms or hypertensive emergency as most likely etiologies (BP 181/110 on night of admission). High dose Lipitor was initiated along with Plavix, amlodipine, Coreg and lisinopril. She was instructed to f/u PCP for her DM.  She states there was a discrepancy between her d/c paperwork saying Coreg 6.25mg  BID and our system saying 12.5mg  BID. The AVS on Epic only says 12.5mg  BID. She says the pharmacy did not have it in their system available for her to pick up. She states she has been taking Plavix, Lipitor and lisinopril and a call to the pharmacy confirms that she picked these up, but not the Coreg. She went back to taking ASA 325mg  since this is what she was used to taking. She is feeling better since d/c. She does occasionally have belching and acid reflux sensation with associated sore throat and hoarseness. She has h/o such. She is having some headaches without any neuro deficits reported. No significant SOB, recent exertional sx, or syncope. No recurrent chest pain. No bleeding. 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   Recent Labs: 04/14/2014: Pro B Natriuretic  peptide (BNP) 234.8*  04/15/2014: HDL Cholesterol by NMR 42; LDL (calc) UNABLE TO CALCULATE IF TRIGLYCERIDE OVER 400 mg/dL; TSH 6.9622.760  9/52/84139/15/2015: Creatinine 0.61; Hemoglobin 12.1; Potassium 4.2   Wt Readings from Last 3 Encounters:  05/04/14 254 lb (115.214 kg)  04/18/14 254 lb (115.214 kg)  04/18/14 254 lb (115.214 kg)     Past Medical History  Diagnosis Date  . Type 2 diabetes mellitus with vascular disease   . Hypertension   . Myocardial infarction   . Hyperlipidemia   . Coronary artery disease     a. inferior STEMI 03/2009 s/p BMS to prox PDA. b. NSTEMI 04/2014: mild diffuse nonobstructive CAD, continued patency of previously placed PDA, no clear culprit. Amlodipine and Plavix added.  . Morbid obesity   . H/O medication noncompliance     Current Outpatient Prescriptions  Medication Sig Dispense Refill  . amLODipine (NORVASC) 5 MG tablet Take 1 tablet (5 mg total) by mouth daily.  30 tablet  5  . atorvastatin (LIPITOR) 80 MG tablet Take 1 tablet (80 mg total) by mouth daily at 6 PM.  30 tablet  5  . carvedilol (COREG) 12.5 MG tablet Take 1 tablet (12.5 mg total) by mouth 2 (two) times daily with a meal.  60 tablet  5  . clopidogrel (PLAVIX) 75 MG tablet Take 1 tablet (75 mg total) by mouth daily.  30 tablet  11  . fenofibrate 54 MG tablet Take 1 tablet (54 mg total) by mouth daily.  30 tablet  5  . Liraglutide (VICTOZA) 18  MG/3ML SOPN Inject 0.6 mg into the skin every morning.  3 pen  0  . lisinopril (PRINIVIL,ZESTRIL) 40 MG tablet Take 1 tablet (40 mg total) by mouth daily.  30 tablet  5  . Melatonin 1 MG TABS Take by mouth. At night to help rest      . metFORMIN (GLUCOPHAGE) 500 MG tablet Take 1 tablet (500 mg total) by mouth 2 (two) times daily with a meal.  60 tablet  2  . Multiple Vitamins-Minerals (MULTIVITAMIN WITH MINERALS) tablet Take 1 tablet by mouth daily.      Marland Kitchen omega-3 acid ethyl esters (LOVAZA) 1 G capsule Take 1 capsule (1 g total) by mouth daily.  30 capsule  5    . sertraline (ZOLOFT) 100 MG tablet Take 150 mg by mouth daily.      Marland Kitchen aspirin EC 325 MG tablet Take 1 tablet (325 mg total) by mouth daily.       No current facility-administered medications for this visit.    Allergies:   Ibuprofen; Naproxen; Pineapple; and Shellfish allergy   Social History:  The patient  reports that she has never smoked. She has never used smokeless tobacco. She reports that she drinks alcohol. She reports that she does not use illicit drugs.   Family History:  The patient's family history includes COPD in her father; Diabetes in her father, maternal grandmother, mother, and paternal grandfather; Heart disease in her maternal grandmother; Hypertension in her father, maternal grandmother, mother, paternal grandmother, and sister; Kidney disease in her sister.   ROS:  Please see the history of present illness.  No unusual bleeding.  All other systems reviewed and negative.   PHYSICAL EXAM:  VS:  BP 140/100  Pulse 85  Ht 5\' 5"  (1.651 m)  Wt 254 lb (115.214 kg)  BMI 42.27 kg/m2  LMP 03/24/2014 Well nourished, well developed obese AAF, in no acute distress HEENT: normal Neck: no JVD Cardiac:  RRR, +S4, no murmurs or rubs Lungs:  clear to auscultation bilaterally, no wheezing, rhonchi or rales Abd: soft, nontender, no hepatomegaly Ext: no edema, radial site without ecchymosis or hematoma, good pulse Skin: warm and dry Neuro:  moves all extremities spontaneously, no focal abnormalities noted  EKG:  NSR 85bpm, inferior infarct age undetermined, nonspecific T wave changes, somewhat improved from prior tracing  ASSESSMENT AND PLAN:  1. CAD - she is taking a full 325mg  aspirin rather than 81mg  as instructed per d/c summary. Given concomitant Plavix, I have asked her to change to 81mg . Start Coreg as planned at 12.5mg  BID. Continue statin.  2. HTN - start Coreg 12.5mg  BID as previously planned. We confirmed it's available for pickup at her pharmacy. Suspect she will  continue to feel better when this is better controlled. Check lytes, Cr today since ACEI was recently resumed. 3. Hyperlipidemia - continue statin. Check CMET today - no recent baseline LFTs. F/u lipids/LFTs in 6 weeks. 4. Morbid obesity - continues to work on regular physical activity. 5. Uncontrolled DM - f/u PCP. 6. GERD - she prefers a cheaper option for rx. D/c summary alludes to changing Prilosec to Protonix but the patient says she hasn't been on this. Will try famotidine 20mg  BID.   Dispo: F/u 6 weeks with Dr. Katrinka Blazing or APP at time of her f/u labs. She requested refill on her Zoloft and I will defer to PCP.  Signed, Ronie Spies, PA-C  05/04/2014 3:18 PM

## 2014-05-08 ENCOUNTER — Telehealth: Payer: Self-pay | Admitting: *Deleted

## 2014-05-08 NOTE — Telephone Encounter (Signed)
lvm with pt's mother since unable to reach by phone. Pt 's home # no voice mail, pt's cell # mail box full. Asked mother to have ptcb so that we may go over her lab results.

## 2014-05-08 NOTE — Telephone Encounter (Signed)
Follow up  ° ° ° °Returning call back to nurse  °

## 2014-05-09 ENCOUNTER — Encounter: Payer: BC Managed Care – PPO | Admitting: Nurse Practitioner

## 2014-05-10 ENCOUNTER — Encounter: Payer: Self-pay | Admitting: *Deleted

## 2014-05-10 NOTE — Telephone Encounter (Signed)
previous lmptcb x 3, no answer tonight. I mailed out results letter to pt tonight.

## 2014-06-04 LAB — HM MAMMOGRAPHY: HM MAMMO: NORMAL

## 2014-06-04 LAB — HM PAP SMEAR: HM Pap smear: NORMAL

## 2014-06-21 ENCOUNTER — Other Ambulatory Visit (INDEPENDENT_AMBULATORY_CARE_PROVIDER_SITE_OTHER): Payer: BC Managed Care – PPO | Admitting: *Deleted

## 2014-06-21 DIAGNOSIS — E785 Hyperlipidemia, unspecified: Secondary | ICD-10-CM

## 2014-06-21 LAB — HEPATIC FUNCTION PANEL
ALT: 19 U/L (ref 0–35)
AST: 21 U/L (ref 0–37)
Albumin: 4.1 g/dL (ref 3.5–5.2)
Alkaline Phosphatase: 73 U/L (ref 39–117)
BILIRUBIN DIRECT: 0 mg/dL (ref 0.0–0.3)
TOTAL PROTEIN: 7.9 g/dL (ref 6.0–8.3)
Total Bilirubin: 0.4 mg/dL (ref 0.2–1.2)

## 2014-06-21 LAB — LIPID PANEL
CHOL/HDL RATIO: 5
Cholesterol: 208 mg/dL — ABNORMAL HIGH (ref 0–200)
HDL: 42.9 mg/dL (ref >39.00)
LDL Cholesterol: 126 mg/dL — ABNORMAL HIGH (ref 0–99)
NONHDL: 165.1
TRIGLYCERIDES: 197 mg/dL — AB (ref 0.0–149.0)
VLDL: 39.4 mg/dL (ref 0.0–40.0)

## 2014-06-22 ENCOUNTER — Ambulatory Visit: Payer: BC Managed Care – PPO | Admitting: Interventional Cardiology

## 2014-06-27 ENCOUNTER — Telehealth: Payer: Self-pay

## 2014-06-27 NOTE — Telephone Encounter (Signed)
called to give pt lab results.lmtcb 

## 2014-06-27 NOTE — Telephone Encounter (Signed)
-----   Message from Lyn RecordsHenry W Smith III, MD sent at 06/26/2014  5:46 PM EST ----- Lipids are improved compared to 2 months ago. Continue low-fat diet, aerobic exercise, and repeat a liver and lipid panel in 6 months.

## 2014-07-10 ENCOUNTER — Ambulatory Visit: Payer: BC Managed Care – PPO | Admitting: Physician Assistant

## 2014-07-13 ENCOUNTER — Encounter (HOSPITAL_COMMUNITY): Payer: Self-pay | Admitting: Cardiovascular Disease

## 2014-07-27 ENCOUNTER — Ambulatory Visit (INDEPENDENT_AMBULATORY_CARE_PROVIDER_SITE_OTHER): Payer: BC Managed Care – PPO | Admitting: Physician Assistant

## 2014-07-27 ENCOUNTER — Encounter: Payer: Self-pay | Admitting: Physician Assistant

## 2014-07-27 VITALS — BP 128/80 | HR 83 | Ht 65.0 in | Wt 252.0 lb

## 2014-07-27 DIAGNOSIS — K219 Gastro-esophageal reflux disease without esophagitis: Secondary | ICD-10-CM

## 2014-07-27 DIAGNOSIS — E785 Hyperlipidemia, unspecified: Secondary | ICD-10-CM

## 2014-07-27 DIAGNOSIS — I10 Essential (primary) hypertension: Secondary | ICD-10-CM

## 2014-07-27 DIAGNOSIS — I2510 Atherosclerotic heart disease of native coronary artery without angina pectoris: Secondary | ICD-10-CM

## 2014-07-27 DIAGNOSIS — E0859 Diabetes mellitus due to underlying condition with other circulatory complications: Secondary | ICD-10-CM

## 2014-07-27 NOTE — Progress Notes (Signed)
Cardiology Office Note   Date:  07/27/2014   ID:  Barbara Jacobs, DOB 1971/07/13, MRN [id number]  PCP:  Neena RhymesKatherine Tabori, MD  Cardiologist:  Dr. Verdis PrimeHenry Smith     History of Present Illness: Barbara Jacobs is a 43 y.o. female with a history of CAD status post inferior STEMI in 2010 treated with a bare metal stent to the proximal PDA, HTN, DM 2, HL, morbid obesity. She was admitted 04/2014 with chest pain and elevated troponin. LHC demonstrated patent PDA stent and no obstructive disease elsewhere. Etiology of elevated troponin was felt to be related to coronary vasospasm versus hypertensive emergency. She was seen in follow-up by Ronie Spiesayna Dunn, PA-C 05/04/14. Blood pressure was uncontrolled. Medications were adjusted. She returns for follow-up.  She is doing well.  She was feeling dizzy on Coreg 12.5 bid and changed this to 6.25 bid.  She feels much better.  The patient denies chest pain, shortness of breath, syncope, orthopnea, PND or significant pedal edema.      Studies:   - LHC (9/15):  PDA stent with 20-30% ISR, inferior HK, EF 60%    Recent Labs: 04/14/2014: Pro B Natriuretic peptide (BNP) 234.8* 04/15/2014: TSH 2.760 04/18/2014: Hemoglobin 12.1 05/04/2014: BUN 15; Creatinine 0.9; Potassium 4.2; Sodium 134* 06/21/2014: ALT 19; LDL (calc) 126*    Estimated Creatinine Clearance: 101.7 mL/min (by C-G formula based on Cr of 0.9).    Recent Radiology: No results found.    Wt Readings from Last 3 Encounters:  07/27/14 252 lb (114.306 kg)  05/04/14 254 lb (115.214 kg)  04/18/14 254 lb (115.214 kg)     Past Medical History  Diagnosis Date  . Type 2 diabetes mellitus with vascular disease   . Hypertension   . Myocardial infarction   . Hyperlipidemia   . Coronary artery disease     a. inferior STEMI 03/2009 s/p BMS to prox PDA. b. NSTEMI 04/2014: mild diffuse nonobstructive CAD, continued patency of previously placed PDA, no clear culprit. Amlodipine and Plavix added.  . Morbid  obesity   . H/O medication noncompliance     Current Outpatient Prescriptions  Medication Sig Dispense Refill  . amLODipine (NORVASC) 5 MG tablet Take 1 tablet (5 mg total) by mouth daily. 30 tablet 5  . aspirin EC 81 MG tablet Take 1 tablet (81 mg total) by mouth daily.    Marland Kitchen. atorvastatin (LIPITOR) 80 MG tablet Take 1 tablet (80 mg total) by mouth daily at 6 PM. 30 tablet 5  . carvedilol (COREG) 6.25 MG tablet Take 6.25 mg by mouth 2 (two) times daily with a meal.     . clopidogrel (PLAVIX) 75 MG tablet Take 1 tablet (75 mg total) by mouth daily. 30 tablet 11  . famotidine (PEPCID) 20 MG tablet Take 1 tablet (20 mg total) by mouth 2 (two) times daily. 60 tablet 5  . fenofibrate 54 MG tablet Take 1 tablet (54 mg total) by mouth daily. 30 tablet 5  . JENCYCLA 0.35 MG tablet Take 1 tablet by mouth daily.     . Liraglutide (VICTOZA) 18 MG/3ML SOPN Inject 0.6 mg into the skin every morning. 3 pen 0  . lisinopril (PRINIVIL,ZESTRIL) 40 MG tablet Take 1 tablet (40 mg total) by mouth daily. 30 tablet 5  . Melatonin 1 MG TABS Take by mouth. At night to help rest    . metFORMIN (GLUCOPHAGE) 500 MG tablet Take 1 tablet (500 mg total) by mouth 2 (two) times daily with a  meal. 60 tablet 2  . Multiple Vitamins-Minerals (MULTIVITAMIN WITH MINERALS) tablet Take 1 tablet by mouth daily.    Marland Kitchen. omega-3 acid ethyl esters (LOVAZA) 1 G capsule Take 1 capsule (1 g total) by mouth daily. 30 capsule 5  . sertraline (ZOLOFT) 50 MG tablet Take 50 mg by mouth daily.      No current facility-administered medications for this visit.     Allergies:   Ibuprofen; Naproxen; Pineapple; and Shellfish allergy   Social History:  The patient  reports that she has never smoked. She has never used smokeless tobacco. She reports that she drinks alcohol. She reports that she does not use illicit drugs.   Family History:  The patient's family history includes COPD in her father; Diabetes in her father, maternal grandmother, mother,  and paternal grandfather; Heart disease in her maternal grandmother; Hypertension in her father, maternal grandmother, mother, paternal grandmother, and sister; Kidney disease in her sister. There is no history of Heart attack or Stroke.    ROS:  Please see the history of present illness.  Reflux is current controlled.    All other systems reviewed and negative.    PHYSICAL EXAM: VS:  BP 128/80 mmHg  Pulse 83  Ht 5\' 5"  (1.651 m)  Wt 252 lb (114.306 kg)  BMI 41.93 kg/m2  SpO2 97% Well nourished, well developed, in no acute distress HEENT: normal Neck: no JVD Cardiac:  normal S1, S2;  RRR; no murmur   Lungs:   clear to auscultation bilaterally, no wheezing, rhonchi or rales Abd: soft, nontender, no hepatomegaly Ext: no edema Skin: warm and dry Neuro:  CNs 2-12 intact, no focal abnormalities noted       ASSESSMENT AND PLAN:   1. Coronary Artery Disease:  No angina.  Continue ASA, Plavix, statin, beta blocker, Amlodipine.  2. Hypertension:  Controlled.  3. Hyperlipidemia:  Continue statin. 06/21/2014: ALT 19; HDL Cholesterol by NMR 42.90; LDL (calc) 126*  Repeat Labs due in 6 mos.  4. Diabetes Mellitus:   FU with PCP. 5. GERD:  Controlled.   Disposition:   FU with Dr. Verdis PrimeHenry Smith 6 mos.    Signed, Brynda RimScott Leslieann Whisman, PA-C, MHS 07/27/2014 8:48 AM    [facility] [address], [place], [place]  [address] Phone: [phone]; Fax: [phone]

## 2014-07-27 NOTE — Patient Instructions (Addendum)
Your physician recommends that you continue on your current medications as directed. Please refer to the Current Medication list given to you today.      Schedule follow up with Barbara Jacobs in 6 months.

## 2014-08-03 ENCOUNTER — Telehealth: Payer: Self-pay | Admitting: Interventional Cardiology

## 2014-08-03 NOTE — Telephone Encounter (Signed)
New Message:  Barbara Jacobs was forwarded to our office wanting to get an ok the refill the pt's glucose strips. Pt sees Dr. Verdis PrimeHenry Smith Please call  Thanks

## 2014-08-10 ENCOUNTER — Telehealth: Payer: Self-pay | Admitting: General Practice

## 2014-08-10 NOTE — Telephone Encounter (Signed)
Last OV 09-22-12 No record of filling these test strips before.   [place]

## 2014-08-10 NOTE — Telephone Encounter (Signed)
Denial faxed back to pharmacy. 

## 2014-08-10 NOTE — Telephone Encounter (Signed)
No refills as it has been over a year since pt was seen.

## 2014-08-28 ENCOUNTER — Ambulatory Visit (INDEPENDENT_AMBULATORY_CARE_PROVIDER_SITE_OTHER): Payer: BC Managed Care – PPO | Admitting: Family Medicine

## 2014-08-28 ENCOUNTER — Encounter: Payer: Self-pay | Admitting: Family Medicine

## 2014-08-28 VITALS — BP 124/80 | HR 78 | Temp 98.5°F | Resp 16 | Ht 66.0 in | Wt 250.1 lb

## 2014-08-28 DIAGNOSIS — E118 Type 2 diabetes mellitus with unspecified complications: Secondary | ICD-10-CM

## 2014-08-28 DIAGNOSIS — Z23 Encounter for immunization: Secondary | ICD-10-CM

## 2014-08-28 DIAGNOSIS — E785 Hyperlipidemia, unspecified: Secondary | ICD-10-CM

## 2014-08-28 DIAGNOSIS — I10 Essential (primary) hypertension: Secondary | ICD-10-CM

## 2014-08-28 LAB — LIPID PANEL
Cholesterol: 218 mg/dL — ABNORMAL HIGH (ref 0–200)
HDL: 41.4 mg/dL (ref >39.00)
NonHDL: 176.6
Total CHOL/HDL Ratio: 5
Triglycerides: 228 mg/dL — ABNORMAL HIGH (ref 0.0–149.0)
VLDL: 45.6 mg/dL — ABNORMAL HIGH (ref 0.0–40.0)

## 2014-08-28 LAB — CBC WITH DIFFERENTIAL/PLATELET
BASOS PCT: 0.3 % (ref 0.0–3.0)
Basophils Absolute: 0 10*3/uL (ref 0.0–0.1)
EOS PCT: 0.4 % (ref 0.0–5.0)
Eosinophils Absolute: 0 10*3/uL (ref 0.0–0.7)
HCT: 39.9 % (ref 36.0–46.0)
Hemoglobin: 13.2 g/dL (ref 12.0–15.0)
LYMPHS ABS: 3.6 10*3/uL (ref 0.7–4.0)
LYMPHS PCT: 32.5 % (ref 12.0–46.0)
MCHC: 33.2 g/dL (ref 30.0–36.0)
MCV: 88 fl (ref 78.0–100.0)
MONO ABS: 0.5 10*3/uL (ref 0.1–1.0)
MONOS PCT: 4.8 % (ref 3.0–12.0)
Neutro Abs: 6.8 10*3/uL (ref 1.4–7.7)
Neutrophils Relative %: 62 % (ref 43.0–77.0)
Platelets: 389 10*3/uL (ref 150.0–400.0)
RBC: 4.53 Mil/uL (ref 3.87–5.11)
RDW: 12.3 % (ref 11.5–15.5)
WBC: 11 10*3/uL — ABNORMAL HIGH (ref 4.0–10.5)

## 2014-08-28 LAB — BASIC METABOLIC PANEL
BUN: 15 mg/dL (ref 6–23)
CO2: 23 meq/L (ref 19–32)
Calcium: 9.8 mg/dL (ref 8.4–10.5)
Chloride: 102 mEq/L (ref 96–112)
Creatinine, Ser: 0.73 mg/dL (ref 0.40–1.20)
GFR: 111.38 mL/min (ref >60.00)
GLUCOSE: 258 mg/dL — AB (ref 70–99)
Potassium: 4.3 mEq/L (ref 3.5–5.1)
Sodium: 133 mEq/L — ABNORMAL LOW (ref 135–145)

## 2014-08-28 LAB — HEPATIC FUNCTION PANEL
ALBUMIN: 4.1 g/dL (ref 3.5–5.2)
ALK PHOS: 74 U/L (ref 39–117)
ALT: 9 U/L (ref 0–35)
AST: 15 U/L (ref 0–37)
BILIRUBIN DIRECT: 0.1 mg/dL (ref 0.0–0.3)
Total Bilirubin: 0.3 mg/dL (ref 0.2–1.2)
Total Protein: 8.1 g/dL (ref 6.0–8.3)

## 2014-08-28 LAB — TSH: TSH: 2.19 u[IU]/mL (ref 0.35–4.50)

## 2014-08-28 LAB — LDL CHOLESTEROL, DIRECT: Direct LDL: 138 mg/dL

## 2014-08-28 LAB — HEMOGLOBIN A1C: Hgb A1c MFr Bld: 12.5 % — ABNORMAL HIGH (ref 4.6–6.5)

## 2014-08-28 MED ORDER — ZOLPIDEM TARTRATE 10 MG PO TABS: 10.0000 mg | ORAL_TABLET | Freq: Every evening | ORAL | Status: DC | PRN

## 2014-08-28 NOTE — Progress Notes (Signed)
Pre visit review using our clinic review tool, if applicable. No additional management support is needed unless otherwise documented below in the visit note. 

## 2014-08-28 NOTE — Progress Notes (Signed)
   Subjective:    Patient ID: Barbara Jacobs, female    DOB: 1971-06-26, 44 y.o.   MRN: [id number]  HPI HTN- chronic problem, on Coreg, Amlodipine, Lisinopril.  No CP, SOB, HAs, visual changes, edema.  DM- chronic problem, on Victoza (1.8 daily) and metformin but has been out of Metformin for over a month.  A1C in Sept was 11.  Pt reports home CBGs 'cant get below 175'.  UTD on eye exam, next due in April.  No numbness/tingling of hands/feet.  Hyperlipidemia- chronic problem, on Lipitor.  Denies abd pain, N/V, myalgias.  Insomnia- pt has hx of this.  Was taking Ambien in the hospital.  Asking for refill for as needed use.   Review of Systems For ROS see HPI     Objective:   Physical Exam  Constitutional: She is oriented to person, place, and time. She appears well-developed and well-nourished. No distress.  HENT:  Head: Normocephalic and atraumatic.  Eyes: Conjunctivae and EOM are normal. Pupils are equal, round, and reactive to light.  Neck: Normal range of motion. Neck supple. No thyromegaly present.  Cardiovascular: Normal rate, regular rhythm, normal heart sounds and intact distal pulses.   No murmur heard. Pulmonary/Chest: Effort normal and breath sounds normal. No respiratory distress.  Abdominal: Soft. She exhibits no distension. There is no tenderness.  Musculoskeletal: She exhibits no edema.  Lymphadenopathy:    She has no cervical adenopathy.  Neurological: She is alert and oriented to person, place, and time.  Skin: Skin is warm and dry.  Psychiatric: She has a normal mood and affect. Her behavior is normal.  Vitals reviewed.         Assessment & Plan:

## 2014-08-28 NOTE — Patient Instructions (Signed)
Follow up in 3-4 months to recheck diabetes We'll notify you of your lab results and make any changes if needed Please try and make healthy, low carb food choices and get regular exercise Call with any questions or concerns Welcome Back!!! Happy [birth year]ated Birthday!!

## 2014-08-29 NOTE — Assessment & Plan Note (Signed)
Chronic problem.  Adequate control.  Asymptomatic at this time.  Check labs.  No anticipated med changes. 

## 2014-08-29 NOTE — Assessment & Plan Note (Signed)
Chronic problem, pt has not followed up as directed and has not been taking care of her diabetes.  She is now committed to improving her sugars.  Applauded this decision.  UTD on eye exam.  On ACE for renal protection.  Currently asymptomatic.  Will check labs and restart metformin/adjust meds prn.  If A1C is still uncontrolled, will likely need endo referral.

## 2014-08-29 NOTE — Assessment & Plan Note (Signed)
Chronic problem.  Tolerating statin w/o difficulty.  Check labs.  Adjust meds prn  

## 2014-09-01 ENCOUNTER — Encounter: Payer: Self-pay | Admitting: General Practice

## 2014-09-01 ENCOUNTER — Other Ambulatory Visit: Payer: Self-pay | Admitting: General Practice

## 2014-09-01 DIAGNOSIS — E119 Type 2 diabetes mellitus without complications: Secondary | ICD-10-CM

## 2014-09-01 MED ORDER — METFORMIN HCL 1000 MG PO TABS: ORAL_TABLET | ORAL | Status: DC

## 2014-10-17 ENCOUNTER — Encounter: Payer: Self-pay | Admitting: Endocrinology

## 2015-02-12 ENCOUNTER — Ambulatory Visit (INDEPENDENT_AMBULATORY_CARE_PROVIDER_SITE_OTHER): Payer: BC Managed Care – PPO | Admitting: Family Medicine

## 2015-02-12 ENCOUNTER — Encounter: Payer: Self-pay | Admitting: Family Medicine

## 2015-02-12 VITALS — BP 136/86 | HR 95 | Temp 98.8°F | Resp 16 | Ht 66.0 in | Wt 247.1 lb

## 2015-02-12 DIAGNOSIS — E1159 Type 2 diabetes mellitus with other circulatory complications: Secondary | ICD-10-CM | POA: Diagnosis not present

## 2015-02-12 DIAGNOSIS — E785 Hyperlipidemia, unspecified: Secondary | ICD-10-CM

## 2015-02-12 DIAGNOSIS — I10 Essential (primary) hypertension: Secondary | ICD-10-CM | POA: Diagnosis not present

## 2015-02-12 LAB — CBC WITH DIFFERENTIAL/PLATELET
BASOS ABS: 0.1 10*3/uL (ref 0.0–0.1)
Basophils Relative: 0.7 % (ref 0.0–3.0)
EOS ABS: 0 10*3/uL (ref 0.0–0.7)
EOS PCT: 0.4 % (ref 0.0–5.0)
HEMATOCRIT: 41.4 % (ref 36.0–46.0)
Hemoglobin: 13.6 g/dL (ref 12.0–15.0)
Lymphocytes Relative: 25.1 % (ref 12.0–46.0)
Lymphs Abs: 2.6 10*3/uL (ref 0.7–4.0)
MCHC: 32.7 g/dL (ref 30.0–36.0)
MCV: 88.9 fl (ref 78.0–100.0)
MONO ABS: 0.5 10*3/uL (ref 0.1–1.0)
Monocytes Relative: 4.6 % (ref 3.0–12.0)
NEUTROS ABS: 7.2 10*3/uL (ref 1.4–7.7)
Neutrophils Relative %: 69.2 % (ref 43.0–77.0)
PLATELETS: 386 10*3/uL (ref 150.0–400.0)
RBC: 4.66 Mil/uL (ref 3.87–5.11)
RDW: 12.3 % (ref 11.5–15.5)
WBC: 10.4 10*3/uL (ref 4.0–10.5)

## 2015-02-12 LAB — HEPATIC FUNCTION PANEL
ALT: 7 U/L (ref 0–35)
AST: 15 U/L (ref 0–37)
Albumin: 4.1 g/dL (ref 3.5–5.2)
Alkaline Phosphatase: 71 U/L (ref 39–117)
BILIRUBIN DIRECT: 0.1 mg/dL (ref 0.0–0.3)
Total Bilirubin: 0.3 mg/dL (ref 0.2–1.2)
Total Protein: 8.1 g/dL (ref 6.0–8.3)

## 2015-02-12 LAB — BASIC METABOLIC PANEL
BUN: 15 mg/dL (ref 6–23)
CALCIUM: 9.7 mg/dL (ref 8.4–10.5)
CO2: 23 meq/L (ref 19–32)
Chloride: 99 mEq/L (ref 96–112)
Creatinine, Ser: 0.82 mg/dL (ref 0.40–1.20)
GFR: 97.19 mL/min (ref >60.00)
GLUCOSE: 319 mg/dL — AB (ref 70–99)
Potassium: 4.5 mEq/L (ref 3.5–5.1)
SODIUM: 130 meq/L — AB (ref 135–145)

## 2015-02-12 LAB — HEMOGLOBIN A1C: HEMOGLOBIN A1C: 11.2 % — AB (ref 4.6–6.5)

## 2015-02-12 LAB — LIPID PANEL
CHOL/HDL RATIO: 6
CHOLESTEROL: 262 mg/dL — AB (ref 0–200)
HDL: 45.1 mg/dL (ref >39.00)
NonHDL: 216.9
TRIGLYCERIDES: 260 mg/dL — AB (ref 0.0–149.0)
VLDL: 52 mg/dL — ABNORMAL HIGH (ref 0.0–40.0)

## 2015-02-12 LAB — TSH: TSH: 2.05 u[IU]/mL (ref 0.35–4.50)

## 2015-02-12 LAB — LDL CHOLESTEROL, DIRECT: Direct LDL: 149 mg/dL

## 2015-02-12 MED ORDER — ZOLPIDEM TARTRATE 10 MG PO TABS: 10.0000 mg | ORAL_TABLET | Freq: Every evening | ORAL | Status: DC | PRN

## 2015-02-12 NOTE — Patient Instructions (Signed)
Follow up in 3-4 months to recheck diabetes We'll notify you of your lab results and make any changes if needed Try and make healthy food choices and get regular exercise Please have your eye doctor send a copy of their report Call with any questions or concerns Have a great summer!!!

## 2015-02-12 NOTE — Progress Notes (Signed)
Pre visit review using our clinic review tool, if applicable. No additional management support is needed unless otherwise documented below in the visit note. 

## 2015-02-12 NOTE — Assessment & Plan Note (Signed)
Chronic problem.  Tolerating statin w/o difficulty.  Check labs.  Adjust meds prn  

## 2015-02-12 NOTE — Assessment & Plan Note (Signed)
Chronic problem.  Adequate control.  Asymptomatic at this time.  Check labs.  No anticipated med changes. 

## 2015-02-12 NOTE — Assessment & Plan Note (Signed)
Chronic problem.  Pt was supposed to go to Endo due to very high A1C but never returned their calls to schedule an appt.  Pt is not taking Victoza as directed and has had to decrease metformin due to GI intolerance w/ recent illness.  On ACE for renal protection.  Eye exam scheduled.  Check labs.  Suspect that pt will again need endo referral.  Stressed need for compliance and f/u as recommended.  Pt expressed understanding and is in agreement w/ plan.

## 2015-02-12 NOTE — Progress Notes (Signed)
   Subjective:    Patient ID: Barbara Jacobs, female    DOB: 1971/04/28, 44 y.o.   MRN: [id number]  HPI DM- chronic problem.  Pt did not follow up w/ Endo- 'i'm really bad about checking my messages'.  Is not taking Victoza. On Metformin 1 tab daily after recent GI bug.  Has eye exam scheduled for next week.  Denies symptomatic lows. No abd pain, N/V- w/ exception of GI bug.  No numbness/tingling of hands/feet.  HTN- chronic problem, adequate but not ideal control today on Amlodipine, Coreg, Lisinopril.  No CP, SOB.  + HAs.  No visual changes.  Hyperlipidemia- chronic problem. On Lipitor and fenofibrate.  Tolerating statins w/o difficulty.   Review of Systems For ROS see HPI     Objective:   Physical Exam  Constitutional: She is oriented to person, place, and time. She appears well-developed and well-nourished. No distress.  HENT:  Head: Normocephalic and atraumatic.  Eyes: Conjunctivae and EOM are normal. Pupils are equal, round, and reactive to light.  Neck: Normal range of motion. Neck supple. No thyromegaly present.  Cardiovascular: Normal rate, regular rhythm, normal heart sounds and intact distal pulses.   No murmur heard. Pulmonary/Chest: Effort normal and breath sounds normal. No respiratory distress.  Abdominal: Soft. She exhibits no distension. There is no tenderness.  Musculoskeletal: She exhibits no edema.  Lymphadenopathy:    She has no cervical adenopathy.  Neurological: She is alert and oriented to person, place, and time.  Skin: Skin is warm and dry.  Psychiatric: She has a normal mood and affect. Her behavior is normal.  Vitals reviewed.         Assessment & Plan:

## 2015-02-13 ENCOUNTER — Other Ambulatory Visit: Payer: Self-pay | Admitting: Family Medicine

## 2015-02-13 DIAGNOSIS — IMO0002 Reserved for concepts with insufficient information to code with codable children: Secondary | ICD-10-CM

## 2015-02-13 DIAGNOSIS — E1165 Type 2 diabetes mellitus with hyperglycemia: Secondary | ICD-10-CM

## 2015-03-30 ENCOUNTER — Ambulatory Visit (INDEPENDENT_AMBULATORY_CARE_PROVIDER_SITE_OTHER): Payer: BC Managed Care – PPO | Admitting: Internal Medicine

## 2015-03-30 ENCOUNTER — Encounter: Payer: Self-pay | Admitting: Internal Medicine

## 2015-03-30 VITALS — BP 122/82 | HR 88 | Temp 98.3°F | Resp 12 | Ht 66.0 in | Wt 247.8 lb

## 2015-03-30 DIAGNOSIS — E1165 Type 2 diabetes mellitus with hyperglycemia: Secondary | ICD-10-CM

## 2015-03-30 DIAGNOSIS — E1151 Type 2 diabetes mellitus with diabetic peripheral angiopathy without gangrene: Secondary | ICD-10-CM

## 2015-03-30 DIAGNOSIS — E1159 Type 2 diabetes mellitus with other circulatory complications: Secondary | ICD-10-CM | POA: Insufficient documentation

## 2015-03-30 HISTORY — DX: Type 2 diabetes mellitus with hyperglycemia: E11.65

## 2015-03-30 HISTORY — DX: Type 2 diabetes mellitus with other circulatory complications: E11.59

## 2015-03-30 MED ORDER — METFORMIN HCL 1000 MG PO TABS: 1000.0000 mg | ORAL_TABLET | Freq: Two times a day (BID) | ORAL | Status: DC

## 2015-03-30 MED ORDER — LIRAGLUTIDE 18 MG/3ML ~~LOC~~ SOPN: 1.8000 mg | PEN_INJECTOR | Freq: Every morning | SUBCUTANEOUS | Status: DC

## 2015-03-30 MED ORDER — INSULIN PEN NEEDLE 32G X 4 MM MISC
Status: DC
Start: 2015-03-30 — End: 2022-02-24

## 2015-03-30 NOTE — Progress Notes (Signed)
Patient ID: Barbara Jacobs, female   DOB: Nov 14, 1970, 44 y.o.   MRN: [id number]  HPI: Barbara Jacobs is a 44 y.o.-year-old female, referred by her PCP, Dr. Beverely Low, for management of DM2, dx in 1998 (started GDM), non-insulin-dependent, uncontrolled, with complications (CAD).   Last hemoglobin A1c was: Lab Results  Component Value Date   HGBA1C 11.2* 02/12/2015   HGBA1C 12.5* 08/28/2014   HGBA1C 11.0* 04/15/2014   Pt is on a regimen of: - Metformin 500 mg 2x a day, with meal (dose increased after the last hbA1c) She was Rx'ed Victoza >> not taking as she misunderstood PCPs instructions - tolerating it fine in the past She also tried Januvia. She tried Glipizide long time ago. She was on insulin when pregnant.  Pt checks her sugars 1x a day and they are: - am: 200-220s >> 160-170s (adjusted diet) - 2h after b'fast: n/c - before lunch: n/c - 2h after lunch: n/c - before dinner: n/c - 2h after dinner: n/c - bedtime: n/c - nighttime: n/c No lows. Lowest sugar was 97 (could not eat for 4 days as she was sick) ; she has hypoglycemia awareness at 70.  Highest sugar was 222.  Glucometer: ?  Pt's meals are: - Breakfast: greek yoghurt + nuts - Lunch: chicken + salad or veggies - Dinner: same  - Snacks: 3x granola She is walking 30 min 3x a week - 10,000 steps.  - no CKD, last BUN/creatinine:  Lab Results  Component Value Date   BUN 15 02/12/2015   CREATININE 0.82 02/12/2015  On Lisinopril. - last set of lipids: Lab Results  Component Value Date   CHOL 262* 02/12/2015   HDL 45.10 02/12/2015   LDLCALC 126* 06/21/2014   LDLDIRECT 149.0 02/12/2015   TRIG 260.0* 02/12/2015   CHOLHDL 6 02/12/2015  On Lipitor 80, Fenofibrate, was on Lovaza. - last eye exam was in 03/2014.  No DR.  - no numbness and tingling in her feet.  Pt has FH of DM in both parents.  ROS: Constitutional: no weight gain/loss, + fatigue, no subjective hyperthermia/hypothermia Eyes: no blurry vision, no  xerophthalmia ENT: + sore throat, no nodules palpated in throat, no dysphagia/odynophagia, no hoarseness Cardiovascular: no CP/SOB/palpitations/leg swelling Respiratory: no cough/SOB Gastrointestinal: no N/V/+ D/no C Musculoskeletal: no muscle/joint aches Skin: no rashes Neurological: no tremors/numbness/tingling/dizziness Psychiatric: + depression/no anxiety  Past Medical History  Diagnosis Date  . Type 2 diabetes mellitus with vascular disease   . Hypertension   . Myocardial infarction   . Hyperlipidemia   . Coronary artery disease     a. inferior STEMI 03/2009 s/p BMS to prox PDA. b. NSTEMI 04/2014: mild diffuse nonobstructive CAD, continued patency of previously placed PDA, no clear culprit. Amlodipine and Plavix added.  . Morbid obesity   . H/O medication noncompliance    Past Surgical History  Procedure Laterality Date  . Coronary stent placement    . Left heart catheterization with coronary angiogram N/A 04/17/2014    Procedure: LEFT HEART CATHETERIZATION WITH CORONARY ANGIOGRAM;  Surgeon: Micheline Chapman, MD;  [facility] LAB;  Service: Cardiovascular;  Laterality: N/A;   Social History   Social History  . Marital Status: Single    Spouse Name: N/A  . Number of Children: 1   Occupational History  . Teacher   Social History Main Topics  . Smoking status: Never Smoker   . Smokeless tobacco: Never Used  . Alcohol Use: Yes     Comment: pt drinks  alcohol once a month  . Drug Use: No  . Sexual Activity: Yes    Birth Control/ Protection: Pill, Condom   Current Outpatient Prescriptions on File Prior to Visit  Medication Sig Dispense Refill  . amLODipine (NORVASC) 5 MG tablet Take 1 tablet (5 mg total) by mouth daily. 30 tablet 5  . aspirin EC 81 MG tablet Take 1 tablet (81 mg total) by mouth daily.    Marland Kitchen atorvastatin (LIPITOR) 80 MG tablet Take 1 tablet (80 mg total) by mouth daily at 6 PM. 30 tablet 5  . carvedilol (COREG) 6.25 MG tablet Take 6.25 mg by mouth 2  (two) times daily with a meal.     . famotidine (PEPCID) 20 MG tablet Take 1 tablet (20 mg total) by mouth 2 (two) times daily. 60 tablet 5  . fenofibrate 54 MG tablet Take 1 tablet (54 mg total) by mouth daily. 30 tablet 5  . lisinopril (PRINIVIL,ZESTRIL) 40 MG tablet Take 1 tablet (40 mg total) by mouth daily. 30 tablet 5  . Melatonin 1 MG TABS Take by mouth. At night to help rest    . metFORMIN (GLUCOPHAGE) 1000 MG tablet Take 1/2 tablet (500mg ) by mouth twice a day for 1 week, then increase to 1 tablet (1000mg ) twice a day. (Patient taking differently: Take 1,000 mg by mouth 2 (two) times daily with a meal. ) 60 tablet 6  . Multiple Vitamins-Minerals (MULTIVITAMIN WITH MINERALS) tablet Take 1 tablet by mouth daily.    . sertraline (ZOLOFT) 50 MG tablet Take 50 mg by mouth daily.     . clopidogrel (PLAVIX) 75 MG tablet Take 1 tablet (75 mg total) by mouth daily. (Patient not taking: Reported on 03/30/2015) 30 tablet 11  . Liraglutide (VICTOZA) 18 MG/3ML SOPN Inject 0.6 mg into the skin every morning. (Patient not taking: Reported on 02/12/2015) 3 pen 0  . omega-3 acid ethyl esters (LOVAZA) 1 G capsule Take 1 capsule (1 g total) by mouth daily. (Patient not taking: Reported on 03/30/2015) 30 capsule 5  . zolpidem (AMBIEN) 10 MG tablet Take 1 tablet (10 mg total) by mouth at bedtime as needed for sleep. 30 tablet 1   No current facility-administered medications on file prior to visit.   Allergies  Allergen Reactions  . Ibuprofen Swelling    Throat swells shut  . Naproxen     Swell up and throat swells shut  . Pineapple   . Shellfish Allergy Hives, Itching and Swelling   Family History  Problem Relation Age of Onset  . Diabetes Mother   . Hypertension Mother   . Diabetes Father   . COPD Father   . Hypertension Father   . Hypertension Sister   . Kidney disease Sister   . Diabetes Maternal Grandmother   . Hypertension Maternal Grandmother   . Heart disease Maternal Grandmother   .  Hypertension Paternal Grandmother   . Diabetes Paternal Grandfather   . Heart attack Neg Hx   . Stroke Neg Hx      PE: BP 122/82 mmHg  Pulse 88  Temp(Src) 98.3 F (36.8 C) (Oral)  Resp 12  Ht 5\' 6"  (1.676 m)  Wt 247 lb 12.8 oz (112.401 kg)  BMI 40.01 kg/m2  SpO2 97% Wt Readings from Last 3 Encounters:  03/30/15 247 lb 12.8 oz (112.401 kg)  02/12/15 247 lb 2 oz (112.095 kg)  08/28/14 250 lb 2 oz (113.456 kg)   Constitutional: overweight, in NAD Eyes: PERRLA, EOMI, no exophthalmos  ENT: moist mucous membranes, no thyromegaly, no cervical lymphadenopathy Cardiovascular: RRR, No MRG Respiratory: CTA B Gastrointestinal: abdomen soft, NT, ND, BS+ Musculoskeletal: no deformities, strength intact in all 4 Skin: moist, warm, no rashes Neurological: no tremor with outstretched hands, DTR normal in all 4  ASSESSMENT: 1. DM2, non-insulin-dependent, uncontrolled, with complications - + CAD - Dr Mendel Ryder  PLAN:  1. Patient with long-standing, uncontrolled diabetes, on oral antidiabetic regimen, which became insufficient. She instituted healthy dietary changes after last HbA1c and her Metformin was also increased. She saw an improvement in sugars after these measurements. For now, I will increase Metformin to target dose and add back Victoza. She will call me with sugars to see if we need to add Glipizide XL. I hope we can manage to bring her sugars down w/o insulin... - I suggested to:  Patient Instructions  Please increase Metformin to 1000 mg 2x a day.  Please start Victoza 0.6 mg daily in am x 5 days, then increase to 1.2 mg daily in am x 5 days, then increase to 1.8 mg daily in am and stay on this dose.  Please call me with sugars in 3 weeks to see if we need to add Glipizide.  Check sugars 2x a day and write them down.  Please return in 1.5 month with your sugar log.   - Strongly advised her to start checking sugars at different times of the day - check 2 times a day, rotating  checks - given sugar log and advised how to fill it and to bring it at next appt  - given foot care handout and explained the principles  - given instructions for hypoglycemia management "15-15 rule"  - advised for yearly eye exams >> she is UTD - Return to clinic in 1.5 mo with sugar log

## 2015-03-30 NOTE — Patient Instructions (Signed)
Please increase Metformin to 1000 mg 2x a day.  Please start Victoza 0.6 mg daily in am x 5 days, then increase to 1.2 mg daily in am x 5 days, then increase to 1.8 mg daily in am and stay on this dose.  Please call me with sugars in 3 weeks to see if we need to add Glipizide.  Check sugars 2x a day and write them down.  Please return in 1.5 month with your sugar log.   PATIENT INSTRUCTIONS FOR TYPE 2 DIABETES:  **Please join MyChart!** - see attached instructions about how to join if you have not done so already.  DIET AND EXERCISE Diet and exercise is an important part of diabetic treatment.  We recommended aerobic exercise in the form of brisk walking (working between 40-60% of maximal aerobic capacity, similar to brisk walking) for 150 minutes per week (such as 30 minutes five days per week) along with 3 times per week performing 'resistance' training (using various gauge rubber tubes with handles) 5-10 exercises involving the major muscle groups (upper body, lower body and core) performing 10-15 repetitions (or near fatigue) each exercise. Start at half the above goal but build slowly to reach the above goals. If limited by weight, joint pain, or disability, we recommend daily walking in a swimming pool with water up to waist to reduce pressure from joints while allow for adequate exercise.    BLOOD GLUCOSES Monitoring your blood glucoses is important for continued management of your diabetes. Please check your blood glucoses 2-4 times a day: fasting, before meals and at bedtime (you can rotate these measurements - e.g. one day check before the 3 meals, the next day check before 2 of the meals and before bedtime, etc.).   HYPOGLYCEMIA (low blood sugar) Hypoglycemia is usually a reaction to not eating, exercising, or taking too much insulin/ other diabetes drugs.  Symptoms include tremors, sweating, hunger, confusion, headache, etc. Treat IMMEDIATELY with 15 grams of Carbs: . 4 glucose  tablets .  cup regular juice/soda . 2 tablespoons raisins . 4 teaspoons sugar . 1 tablespoon honey Recheck blood glucose in 15 mins and repeat above if still symptomatic/blood glucose <100.  RECOMMENDATIONS TO REDUCE YOUR RISK OF DIABETIC COMPLICATIONS: * Take your prescribed MEDICATION(S) * Follow a DIABETIC diet: Complex carbs, fiber rich foods, (monounsaturated and polyunsaturated) fats * AVOID saturated/trans fats, high fat foods, >2,300 mg salt per day. * EXERCISE at least 5 times a week for 30 minutes or preferably daily.  * DO NOT SMOKE OR DRINK more than 1 drink a day. * Check your FEET every day. Do not wear tightfitting shoes. Contact us if you develop an ulcer * See your EYE doctor once a year or more if needed * Get a FLU shot once a year * Get a PNEUMONIA vaccine once before and once after age 7 years  GOALS:  * Your Hemoglobin A1c of <7%  * fasting sugars need to be <130 * after meals sugars need to be <180 (2h after you start eating) * Your Systolic BP should be 140 or lower  * Your Diastolic BP should be 80 or lower  * Your HDL (Good Cholesterol) should be 40 or higher  * Your LDL (Bad Cholesterol) should be 100 or lower. * Your Triglycerides should be 150 or lower  * Your Urine microalbumin (kidney function) should be <30 * Your Body Mass Index should be 25 or lower    Please consider the following ways to  cut down carbs and fat and increase fiber and micronutrients in your diet: - substitute whole grain for white bread or pasta - substitute brown rice for white rice - substitute 90-calorie flat bread pieces for slices of bread when possible - substitute sweet potatoes or yams for white potatoes - substitute humus for margarine - substitute tofu for cheese when possible - substitute almond or rice milk for regular milk (would not drink soy milk daily due to concern for soy estrogen influence on breast cancer risk) - substitute dark chocolate for other sweets  when possible - substitute water - can add lemon or orange slices for taste - for diet sodas (artificial sweeteners will trick your body that you can eat sweets without getting calories and will lead you to overeating and weight gain in the long run) - do not skip breakfast or other meals (this will slow down the metabolism and will result in more weight gain over time)  - can try smoothies made from fruit and almond/rice milk in am instead of regular breakfast - can also try old-fashioned (not instant) oatmeal made with almond/rice milk in am - order the dressing on the side when eating salad at a restaurant (pour less than half of the dressing on the salad) - eat as little meat as possible - can try juicing, but should not forget that juicing will get rid of the fiber, so would alternate with eating raw veg./fruits or drinking smoothies - use as little oil as possible, even when using olive oil - can dress a salad with a mix of balsamic vinegar and lemon juice, for e.g. - use agave nectar, stevia sugar, or regular sugar rather than artificial sweateners - steam or broil/roast veggies  - snack on veggies/fruit/nuts (unsalted, preferably) when possible, rather than processed foods - reduce or eliminate aspartame in diet (it is in diet sodas, chewing gum, etc) Read the labels!  Try to read Dr. Katherina Right book: "Program for Reversing Diabetes" for other ideas for healthy eating.

## 2015-05-22 ENCOUNTER — Other Ambulatory Visit: Payer: Self-pay

## 2015-05-22 DIAGNOSIS — Z1231 Encounter for screening mammogram for malignant neoplasm of breast: Secondary | ICD-10-CM

## 2015-06-08 ENCOUNTER — Ambulatory Visit
Admission: RE | Admit: 2015-06-08 | Discharge: 2015-06-08 | Disposition: A | Discharge status: Home / Self Care | Payer: BC Managed Care – PPO | Source: Ambulatory Visit

## 2015-06-08 DIAGNOSIS — Z1231 Encounter for screening mammogram for malignant neoplasm of breast: Secondary | ICD-10-CM

## 2015-06-15 ENCOUNTER — Ambulatory Visit: Payer: BC Managed Care – PPO | Admitting: Family Medicine

## 2015-07-06 LAB — HM DIABETES EYE EXAM

## 2015-07-16 ENCOUNTER — Ambulatory Visit (INDEPENDENT_AMBULATORY_CARE_PROVIDER_SITE_OTHER): Payer: BC Managed Care – PPO | Admitting: Family Medicine

## 2015-07-16 ENCOUNTER — Encounter: Payer: Self-pay | Admitting: Family Medicine

## 2015-07-16 VITALS — BP 122/84 | HR 100 | Temp 98.0°F | Resp 16 | Ht 66.0 in | Wt 249.1 lb

## 2015-07-16 DIAGNOSIS — I10 Essential (primary) hypertension: Secondary | ICD-10-CM | POA: Diagnosis not present

## 2015-07-16 DIAGNOSIS — E785 Hyperlipidemia, unspecified: Secondary | ICD-10-CM

## 2015-07-16 NOTE — Assessment & Plan Note (Signed)
Chronic problem.  On Lipitor and fenofibrate w/o difficulty.  Check labs.  Adjust meds prn

## 2015-07-16 NOTE — Progress Notes (Signed)
Pre visit review using our clinic review tool, if applicable. No additional management support is needed unless otherwise documented below in the visit note. 

## 2015-07-16 NOTE — Progress Notes (Signed)
   Subjective:    Patient ID: [name], female    DOB: 1970-08-06, 44 y.o.   MRN: [id number]  HPI Hyperlipidemia- chronic problem, supposed to be on Lipitor daily but only had 6 month supply written 15 months ago.  Last LDL was 149.  Denies abd pain, N/V, myalgias.  Obesity- chronic problem.  Pt's weight is stable but w/ a BMI over 40, she really needs to lose weight.  Walking 3x/week.  HTN- chronic problem, on Amlodipine, Coreg, Lisinopril.  No CP, SOB, HAs, visual changes, edema.   Review of Systems For ROS see HPI     Objective:   Physical Exam  Constitutional: She is oriented to person, place, and time. She appears well-developed and well-nourished. No distress.  obese  HENT:  Head: Normocephalic and atraumatic.  Eyes: Conjunctivae and EOM are normal. Pupils are equal, round, and reactive to light.  Neck: Normal range of motion. Neck supple. No thyromegaly present.  Cardiovascular: Normal rate, regular rhythm, normal heart sounds and intact distal pulses.   No murmur heard. Pulmonary/Chest: Effort normal and breath sounds normal. No respiratory distress.  Abdominal: Soft. She exhibits no distension. There is no tenderness.  Musculoskeletal: She exhibits no edema.  Lymphadenopathy:    She has no cervical adenopathy.  Neurological: She is alert and oriented to person, place, and time.  Skin: Skin is warm and dry.  Psychiatric: She has a normal mood and affect. Her behavior is normal.  Vitals reviewed.         Assessment & Plan:

## 2015-07-16 NOTE — Assessment & Plan Note (Signed)
Chronic problem.  Adequate control.  Asymptomatic.  Check labs.  No anticipated med changes.  Stressed need for healthy diet and regular exercise.  Will continue to follow.

## 2015-07-16 NOTE — Assessment & Plan Note (Signed)
Chronic problem.  Again stressed need for healthy diet and regular exercise as this is biggest risk to pt's health.  Check labs to risk stratify.  Will continue to follow.

## 2015-07-16 NOTE — Patient Instructions (Signed)
Schedule your complete physical in 6 months We'll notify you of your lab results and make any changes if needed Continue to work on healthy diet and regular exercise- you can do it!!! Call with any questions or concerns If you want to join us at the new [place] office, any scheduled appointments will automatically transfer and we will see you at [address], [place], [place] [id number] (OPENING 08/07/15) Happy Holidays!!!

## 2015-07-17 LAB — CBC WITH DIFFERENTIAL/PLATELET
BASOS ABS: 0.1 10*3/uL (ref 0.0–0.1)
Basophils Relative: 0.8 % (ref 0.0–3.0)
EOS ABS: 0.1 10*3/uL (ref 0.0–0.7)
Eosinophils Relative: 0.4 % (ref 0.0–5.0)
HCT: 39.9 % (ref 36.0–46.0)
Hemoglobin: 13.1 g/dL (ref 12.0–15.0)
LYMPHS ABS: 3.1 10*3/uL (ref 0.7–4.0)
Lymphocytes Relative: 26.3 % (ref 12.0–46.0)
MCHC: 32.8 g/dL (ref 30.0–36.0)
MCV: 88.9 fl (ref 78.0–100.0)
MONOS PCT: 3.7 % (ref 3.0–12.0)
Monocytes Absolute: 0.4 10*3/uL (ref 0.1–1.0)
NEUTROS ABS: 8.2 10*3/uL — AB (ref 1.4–7.7)
Neutrophils Relative %: 68.8 % (ref 43.0–77.0)
Platelets: 405 10*3/uL — ABNORMAL HIGH (ref 150.0–400.0)
RBC: 4.49 Mil/uL (ref 3.87–5.11)
RDW: 12.1 % (ref 11.5–15.5)
WBC: 12 10*3/uL — ABNORMAL HIGH (ref 4.0–10.5)

## 2015-07-17 LAB — BASIC METABOLIC PANEL
BUN: 13 mg/dL (ref 6–23)
CALCIUM: 9.4 mg/dL (ref 8.4–10.5)
CO2: 25 mEq/L (ref 19–32)
CREATININE: 0.82 mg/dL (ref 0.40–1.20)
Chloride: 100 mEq/L (ref 96–112)
GFR: 97 mL/min (ref >60.00)
GLUCOSE: 325 mg/dL — AB (ref 70–99)
Potassium: 4.2 mEq/L (ref 3.5–5.1)
SODIUM: 133 meq/L — AB (ref 135–145)

## 2015-07-17 LAB — HEPATIC FUNCTION PANEL
ALK PHOS: 78 U/L (ref 39–117)
ALT: 7 U/L (ref 0–35)
AST: 14 U/L (ref 0–37)
Albumin: 4 g/dL (ref 3.5–5.2)
BILIRUBIN TOTAL: 0.2 mg/dL (ref 0.2–1.2)
Bilirubin, Direct: 0.1 mg/dL (ref 0.0–0.3)
Total Protein: 7.5 g/dL (ref 6.0–8.3)

## 2015-07-17 LAB — LIPID PANEL
CHOLESTEROL: 273 mg/dL — AB (ref 0–200)
HDL: 49.3 mg/dL (ref >39.00)
Total CHOL/HDL Ratio: 6
Triglycerides: 420 mg/dL — ABNORMAL HIGH (ref 0.0–149.0)

## 2015-07-17 LAB — LDL CHOLESTEROL, DIRECT: LDL DIRECT: 173 mg/dL

## 2015-07-18 ENCOUNTER — Other Ambulatory Visit: Payer: Self-pay | Admitting: General Practice

## 2015-07-18 MED ORDER — ATORVASTATIN CALCIUM 80 MG PO TABS: 80.0000 mg | ORAL_TABLET | Freq: Every day | ORAL | Status: AC

## 2015-07-18 MED ORDER — FENOFIBRATE 54 MG PO TABS: 54.0000 mg | ORAL_TABLET | Freq: Every day | ORAL | Status: DC

## 2015-08-28 ENCOUNTER — Ambulatory Visit: Payer: BC Managed Care – PPO | Admitting: Internal Medicine

## 2015-08-31 ENCOUNTER — Other Ambulatory Visit: Payer: Self-pay | Admitting: Family Medicine

## 2015-08-31 ENCOUNTER — Other Ambulatory Visit: Payer: Self-pay | Admitting: Cardiology

## 2015-09-03 NOTE — Telephone Encounter (Signed)
Medication filled to pharmacy as requested.   

## 2015-09-03 NOTE — Telephone Encounter (Signed)
Last OV 07/16/15 Zolpidem last filled 02/12/15 #30 with 1

## 2015-09-06 ENCOUNTER — Other Ambulatory Visit: Payer: Self-pay | Admitting: Internal Medicine

## 2015-09-12 ENCOUNTER — Other Ambulatory Visit (INDEPENDENT_AMBULATORY_CARE_PROVIDER_SITE_OTHER): Payer: BC Managed Care – PPO | Admitting: *Deleted

## 2015-09-12 ENCOUNTER — Encounter: Payer: Self-pay | Admitting: Internal Medicine

## 2015-09-12 ENCOUNTER — Ambulatory Visit (INDEPENDENT_AMBULATORY_CARE_PROVIDER_SITE_OTHER): Payer: BC Managed Care – PPO | Admitting: Internal Medicine

## 2015-09-12 VITALS — BP 120/76 | HR 85 | Temp 98.0°F | Resp 12 | Wt 241.0 lb

## 2015-09-12 DIAGNOSIS — E1159 Type 2 diabetes mellitus with other circulatory complications: Secondary | ICD-10-CM

## 2015-09-12 DIAGNOSIS — E1165 Type 2 diabetes mellitus with hyperglycemia: Secondary | ICD-10-CM

## 2015-09-12 LAB — POCT GLYCOSYLATED HEMOGLOBIN (HGB A1C): HEMOGLOBIN A1C: 10.1

## 2015-09-12 MED ORDER — METFORMIN HCL 1000 MG PO TABS: 1000.0000 mg | ORAL_TABLET | Freq: Two times a day (BID) | ORAL | Status: DC

## 2015-09-12 MED ORDER — GLIPIZIDE 5 MG PO TABS: 5.0000 mg | ORAL_TABLET | Freq: Two times a day (BID) | ORAL | Status: DC

## 2015-09-12 NOTE — Patient Instructions (Signed)
Please continue: - Metformin 1000 mg 2x a day, with meals - Victoza 1.8 mg daily - but move this to am  Please add: - Glipizide 5 mg 2x a day, before breakfast and before dinner. You may need to increase the dose to 10 mg 2x a day, if sugars are still high after these meals.  Please continue diet and exercise.  Please return in 3 months with your sugar log.

## 2015-09-12 NOTE — Progress Notes (Signed)
Patient ID: Barbara Jacobs, female   DOB: Oct 15, 1970, 45 y.o.   MRN: [id number]  HPI: Barbara Jacobs is a 45 y.o.-year-old female, returning for follow-up for DM2, dx in 1998 (started GDM), non-insulin-dependent, uncontrolled, with complications (CAD). Last visit was 6 months ago (did not return in 1.5 months as advised).  She had 2 mo of depression this winter >> weight gain, noncompliance with DM meds. She lost 8 lbs in last mo >> low carb diet and exercise.  Last hemoglobin A1c was: Lab Results  Component Value Date   HGBA1C 11.2* 02/12/2015   HGBA1C 12.5* 08/28/2014   HGBA1C 11.0* 04/15/2014   Reviewed labs: 07/2015: Glucose 300 on BMP  Pt is on a regimen of: - Metformin 1000 mg 2x a day, with meals - Victoza 1.8 mg daily-started 03/2015 She also tried Januvia. She tried Glipizide long time ago. She was on insulin when pregnant.  Pt checks her sugars 1x a day and they are: - am: 200-220s >> 160-170s (adjusted diet) >> 167-170 - 2h after b'fast: n/c >> 200s - before lunch: n/c  - 2h after lunch: n/c - before dinner: n/c - 2h after dinner: n/c >> 200s - bedtime: n/c >> 190s - nighttime: n/c No lows. Lowest sugar was 97 (could not eat for 4 days as she was sick) ; she has hypoglycemia awareness at 70.  Highest sugar was 222 >> 200s.  Glucometer: ?  Pt's meals are: - Breakfast: eggs + protein shake - Lunch: chicken + salad or veggies - Dinner: same  - Snacks: 3x granola She is walking 30 min 3x a week - 10,000 steps.  - no CKD, last BUN/creatinine:  Lab Results  Component Value Date   BUN 13 07/16/2015   CREATININE 0.82 07/16/2015  On Lisinopril. - last set of lipids: Lab Results  Component Value Date   CHOL 273* 07/16/2015   HDL 49.30 07/16/2015   LDLCALC 126* 06/21/2014   LDLDIRECT 173.0 07/16/2015   TRIG * 07/16/2015    420.0 Triglyceride is over 400; calculations on Lipids are invalid.   CHOLHDL 6 07/16/2015  On Lipitor 80, Fenofibrate. - last eye exam  was in 07/2015.  No DR.  - no numbness and tingling in her feet.  ROS: Constitutional: no weight gain/loss, + fatigue, no subjective hyperthermia/hypothermia Eyes: no blurry vision, no xerophthalmia ENT: + sore throat, no nodules palpated in throat, no dysphagia/odynophagia, no hoarseness Cardiovascular: no CP/SOB/palpitations/leg swelling Respiratory: no cough/SOB Gastrointestinal: no N/V/+ D/no C Musculoskeletal: no muscle/joint aches Skin: no rashes Neurological: no tremors/numbness/tingling/dizziness  I reviewed pt's medications, allergies, PMH, social hx, family hx, and changes were documented in the history of present illness. Otherwise, unchanged from my initial visit note.  Past Medical History  Diagnosis Date  . Type 2 diabetes mellitus with vascular disease (HCC)   . Hypertension   . Myocardial infarction (HCC)   . Hyperlipidemia   . Coronary artery disease     a. inferior STEMI 03/2009 s/p BMS to prox PDA. b. NSTEMI 04/2014: mild diffuse nonobstructive CAD, continued patency of previously placed PDA, no clear culprit. Amlodipine and Plavix added.  . Morbid obesity (HCC)   . H/O medication noncompliance    Past Surgical History  Procedure Laterality Date  . Coronary stent placement    . Left heart catheterization with coronary angiogram N/A 04/17/2014    Procedure: LEFT HEART CATHETERIZATION WITH CORONARY ANGIOGRAM;  Surgeon: Micheline Chapman, MD;  Location: [facility] CATH LAB;  Service: Cardiovascular;  Laterality: N/A;   Social History   Social History  . Marital Status: Single    Spouse Name: N/A  . Number of Children: 1   Occupational History  . Teacher   Social History Main Topics  . Smoking status: Never Smoker   . Smokeless tobacco: Never Used  . Alcohol Use: Yes     Comment: pt drinks alcohol once a month  . Drug Use: No  . Sexual Activity: Yes    Birth Control/ Protection: Pill, Condom   Current Outpatient Prescriptions on File Prior to Visit  Medication  Sig Dispense Refill  . amLODipine (NORVASC) 5 MG tablet Take 1 tablet (5 mg total) by mouth daily. 30 tablet 5  . aspirin EC 81 MG tablet Take 1 tablet (81 mg total) by mouth daily.    Marland Kitchen atorvastatin (LIPITOR) 80 MG tablet Take 1 tablet (80 mg total) by mouth daily at 6 PM. 30 tablet 5  . carvedilol (COREG) 6.25 MG tablet Take 6.25 mg by mouth 2 (two) times daily with a meal.     . clopidogrel (PLAVIX) 75 MG tablet Take 1 tablet (75 mg total) by mouth daily. 30 tablet 11  . ERRIN 0.35 MG tablet     . famotidine (PEPCID) 20 MG tablet Take 1 tablet (20 mg total) by mouth 2 (two) times daily. 60 tablet 5  . fenofibrate 54 MG tablet Take 1 tablet (54 mg total) by mouth daily. 30 tablet 5  . Insulin Pen Needle (FIFTY50 PEN NEEDLES) 32G X 4 MM MISC Use 1x a day 100 each 1  . Liraglutide (VICTOZA) 18 MG/3ML SOPN Inject 0.3 mLs (1.8 mg total) into the skin every morning. 3 pen 2  . lisinopril (PRINIVIL,ZESTRIL) 40 MG tablet TAKE ONE TABLET BY MOUTH ONCE DAILY 30 tablet 0  . Melatonin 1 MG TABS Take by mouth. At night to help rest    . metFORMIN (GLUCOPHAGE) 1000 MG tablet TAKE ONE TABLET BY MOUTH TWICE DAILY WITH A MEAL 60 tablet 0  . Multiple Vitamins-Minerals (MULTIVITAMIN WITH MINERALS) tablet Take 1 tablet by mouth daily.    Marland Kitchen omega-3 acid ethyl esters (LOVAZA) 1 G capsule Take 1 capsule (1 g total) by mouth daily. 30 capsule 5  . sertraline (ZOLOFT) 50 MG tablet Take 50 mg by mouth daily.     Marland Kitchen zolpidem (AMBIEN) 10 MG tablet TAKE ONE TABLET BY MOUTH AT BEDTIME AS NEEDED FOR SLEEP 30 tablet 1   No current facility-administered medications on file prior to visit.   Allergies  Allergen Reactions  . Ibuprofen Swelling    Throat swells shut  . Naproxen     Swell up and throat swells shut  . Pineapple   . Shellfish Allergy Hives, Itching and Swelling   Family History  Problem Relation Age of Onset  . Diabetes Mother   . Hypertension Mother   . Diabetes Father   . COPD Father   .  Hypertension Father   . Hypertension Sister   . Kidney disease Sister   . Diabetes Maternal Grandmother   . Hypertension Maternal Grandmother   . Heart disease Maternal Grandmother   . Hypertension Paternal Grandmother   . Diabetes Paternal Grandfather   . Heart attack Neg Hx   . Stroke Neg Hx    PE: BP 120/76 mmHg  Pulse 85  Temp(Src) 98 F (36.7 C) (Oral)  Resp 12  Wt 241 lb (109.317 kg)  SpO2 95% Body mass index is 38.92 kg/(m^2). Wt Readings  from Last 3 Encounters:  09/12/15 241 lb (109.317 kg)  07/16/15 249 lb 2 oz (113.002 kg)  03/30/15 247 lb 12.8 oz (112.401 kg)   Constitutional: overweight, in NAD Eyes: PERRLA, EOMI, no exophthalmos ENT: moist mucous membranes, no thyromegaly, no cervical lymphadenopathy Cardiovascular: RRR, No MRG Respiratory: CTA B Gastrointestinal: abdomen soft, NT, ND, BS+ Musculoskeletal: no deformities, strength intact in all 4 Skin: moist, warm, no rashes Neurological: no tremor with outstretched hands, DTR normal in all 4  ASSESSMENT: 1. DM2, non-insulin-dependent, uncontrolled, with complications - + CAD - Dr Mendel Ryder  PLAN:  1. Patient with long-standing, uncontrolled diabetes, on oral antidiabetic regimen + Victoza. Sugars better after she started her low carb diet and walking daily. She also lost 8 lbs. Will move Victoza in am and will try to add Glipizide and avoid insulin if she continues her diet and exercise. I advised her to contact me in 2 weeks Re: sugars. - I advised her to: Patient Instructions  Please continue: - Metformin 1000 mg 2x a day, with meals - Victoza 1.8 mg daily - but move this to am  Please add: - Glipizide 5 mg 2x a day, before breakfast and before dinner. You may need to increase the dose to 10 mg 2x a day, if sugars are still high after these meals.  Please continue diet and exercise.  Please return in 3 months with your sugar log.   - check sugars at different times of the day - check 2 times a day,  rotating checks - advised for yearly eye exams >> she is UTD - check A1c today >> 10.1% (still high) - Return to clinic in 3 mo with sugar log

## 2015-09-28 ENCOUNTER — Encounter: Payer: Self-pay | Admitting: Internal Medicine

## 2015-10-11 ENCOUNTER — Encounter: Payer: Self-pay | Admitting: Internal Medicine

## 2015-10-26 ENCOUNTER — Encounter: Payer: Self-pay | Admitting: Family Medicine

## 2015-10-26 MED ORDER — CARVEDILOL 6.25 MG PO TABS: 6.2500 mg | ORAL_TABLET | Freq: Two times a day (BID) | ORAL | Status: AC

## 2015-10-26 MED ORDER — LISINOPRIL 40 MG PO TABS: 40.0000 mg | ORAL_TABLET | Freq: Every day | ORAL | Status: AC

## 2015-10-26 NOTE — Telephone Encounter (Signed)
Medication filled to pharmacy as requested.   

## 2015-12-13 ENCOUNTER — Ambulatory Visit: Payer: BC Managed Care – PPO | Admitting: Internal Medicine

## 2016-02-13 ENCOUNTER — Ambulatory Visit (INDEPENDENT_AMBULATORY_CARE_PROVIDER_SITE_OTHER): Payer: BC Managed Care – PPO | Admitting: Internal Medicine

## 2016-02-13 ENCOUNTER — Encounter: Payer: Self-pay | Admitting: Internal Medicine

## 2016-02-13 VITALS — BP 128/90 | HR 90 | Ht 66.0 in | Wt 237.0 lb

## 2016-02-13 DIAGNOSIS — E781 Pure hyperglyceridemia: Secondary | ICD-10-CM | POA: Diagnosis not present

## 2016-02-13 DIAGNOSIS — E1165 Type 2 diabetes mellitus with hyperglycemia: Secondary | ICD-10-CM

## 2016-02-13 DIAGNOSIS — IMO0001 Reserved for inherently not codable concepts without codable children: Secondary | ICD-10-CM

## 2016-02-13 DIAGNOSIS — E1159 Type 2 diabetes mellitus with other circulatory complications: Secondary | ICD-10-CM | POA: Diagnosis not present

## 2016-02-13 HISTORY — DX: Reserved for inherently not codable concepts without codable children: IMO0001

## 2016-02-13 HISTORY — DX: Pure hyperglyceridemia: E78.1

## 2016-02-13 LAB — POCT GLYCOSYLATED HEMOGLOBIN (HGB A1C): HEMOGLOBIN A1C: 6.9

## 2016-02-13 NOTE — Addendum Note (Signed)
Addended by: Darene LamerHOMPSON, Karol Liendo T on: 02/13/2016 10:04 AM   Modules accepted: Orders

## 2016-02-13 NOTE — Progress Notes (Signed)
Patient ID: Barbara Jacobs, female   DOB: September 04, 1970, 45 y.o.   MRN: [id number]  HPI: Barbara Jacobs is a 45 y.o.-year-old female, returning for follow-up for DM2, dx in 1998 (started GDM), non-insulin-dependent, uncontrolled, with complications (CAD). Last visit was 5 months ago.  She had 2 mo of depression last winter >> weight gain, noncompliance with DM meds.   She lost 12 lbs in last 6 mo >> low carb diet and exercise.  Last hemoglobin A1c was: Lab Results  Component Value Date   HGBA1C 10.1 09/12/2015   HGBA1C 11.2* 02/12/2015   HGBA1C 12.5* 08/28/2014   Pt is on a regimen of: - Metformin 1000 mg 2x a day, with meals - Victoza 1.8 mg daily-started 03/2015 - Glipizide 2.5 mg 2x a day, before breakfast and before dinner. She also tried Januvia. She was on insulin when pregnant.  Pt checks her sugars 2-3x a day and they are: - am: 200-220s >> 160-170s (adjusted diet) >> 167-170 >> 121-154, 163, 190 - 2h after b'fast: n/c >> 200s >> 90-150 - before lunch: n/c  - 2h after lunch: n/c >> 102-161 - before dinner: n/c - 2h after dinner: n/c >> 200s >> 123-148 - bedtime: n/c >> 190s - nighttime: n/c No lows. Lowest sugar was 97 (could not eat for 4 days as she was sick) >> 90; she has hypoglycemia awareness at 70.  Highest sugar was 222 >> 200s >> 190.  Glucometer: ?  Pt's meals are: - Breakfast: eggs + protein shake - Lunch: chicken + salad or veggies - Dinner: same  - Snacks: 3x granola She is walking 30 min 3x a week - 10,000 steps.  - no CKD, last BUN/creatinine:  Lab Results  Component Value Date   BUN 13 07/16/2015   CREATININE 0.82 07/16/2015  On Lisinopril. - last set of lipids: Lab Results  Component Value Date   CHOL 273* 07/16/2015   HDL 49.30 07/16/2015   LDLCALC 126* 06/21/2014   LDLDIRECT 173.0 07/16/2015   TRIG * 07/16/2015    420.0 Triglyceride is over 400; calculations on Lipids are invalid.   CHOLHDL 6 07/16/2015  On Lipitor 80, Fenofibrate. -  last eye exam was in 07/2015.  No DR.  - no numbness and tingling in her feet.  ROS: Constitutional: + weight loss, no fatigue, no subjective hyperthermia/hypothermia Eyes: no blurry vision, no xerophthalmia ENT: no sore throat, no nodules palpated in throat, no dysphagia/odynophagia, no hoarseness Cardiovascular: no CP/SOB/palpitations/leg swelling Respiratory: no cough/SOB Gastrointestinal: no N/V/D/C Musculoskeletal: no muscle/joint aches Skin: no rashes Neurological: no tremors/numbness/tingling/dizziness  I reviewed pt's medications, allergies, PMH, social hx, family hx, and changes were documented in the history of present illness. Otherwise, unchanged from my initial visit note.  Past Medical History  Diagnosis Date  . Type 2 diabetes mellitus with vascular disease (HCC)   . Hypertension   . Myocardial infarction (HCC)   . Hyperlipidemia   . Coronary artery disease     a. inferior STEMI 03/2009 s/p BMS to prox PDA. b. NSTEMI 04/2014: mild diffuse nonobstructive CAD, continued patency of previously placed PDA, no clear culprit. Amlodipine and Plavix added.  . Morbid obesity (HCC)   . H/O medication noncompliance    Past Surgical History  Procedure Laterality Date  . Coronary stent placement    . Left heart catheterization with coronary angiogram N/A 04/17/2014    Procedure: LEFT HEART CATHETERIZATION WITH CORONARY ANGIOGRAM;  Surgeon: Micheline Chapman, MD;  Location: [facility] CATH LAB;  Service: Cardiovascular;  Laterality: N/A;   Social History   Social History  . Marital Status: Single    Spouse Name: N/A  . Number of Children: 1   Occupational History  . Teacher   Social History Main Topics  . Smoking status: Never Smoker   . Smokeless tobacco: Never Used  . Alcohol Use: Yes     Comment: pt drinks alcohol once a month  . Drug Use: No  . Sexual Activity: Yes    Birth Control/ Protection: Pill, Condom   Current Outpatient Prescriptions on File Prior to Visit   Medication Sig Dispense Refill  . amLODipine (NORVASC) 5 MG tablet Take 1 tablet (5 mg total) by mouth daily. 30 tablet 5  . aspirin EC 81 MG tablet Take 1 tablet (81 mg total) by mouth daily.    Marland Kitchen atorvastatin (LIPITOR) 80 MG tablet Take 1 tablet (80 mg total) by mouth daily at 6 PM. 30 tablet 5  . carvedilol (COREG) 6.25 MG tablet Take 1 tablet (6.25 mg total) by mouth 2 (two) times daily with a meal. 60 tablet 6  . clopidogrel (PLAVIX) 75 MG tablet Take 1 tablet (75 mg total) by mouth daily. 30 tablet 11  . ERRIN 0.35 MG tablet     . famotidine (PEPCID) 20 MG tablet Take 1 tablet (20 mg total) by mouth 2 (two) times daily. 60 tablet 5  . fenofibrate 54 MG tablet Take 1 tablet (54 mg total) by mouth daily. 30 tablet 5  . glipiZIDE (GLUCOTROL) 5 MG tablet Take 1 tablet (5 mg total) by mouth 2 (two) times daily before a meal. 180 tablet 1  . Insulin Pen Needle (FIFTY50 PEN NEEDLES) 32G X 4 MM MISC Use 1x a day 100 each 1  . Liraglutide (VICTOZA) 18 MG/3ML SOPN Inject 0.3 mLs (1.8 mg total) into the skin every morning. 3 pen 2  . lisinopril (PRINIVIL,ZESTRIL) 40 MG tablet Take 1 tablet (40 mg total) by mouth daily. 30 tablet 6  . metFORMIN (GLUCOPHAGE) 1000 MG tablet Take 1 tablet (1,000 mg total) by mouth 2 (two) times daily with a meal. 180 tablet 1  . Multiple Vitamins-Minerals (MULTIVITAMIN WITH MINERALS) tablet Take 1 tablet by mouth daily.    Marland Kitchen omega-3 acid ethyl esters (LOVAZA) 1 G capsule Take 1 capsule (1 g total) by mouth daily. 30 capsule 5  . sertraline (ZOLOFT) 50 MG tablet Take 50 mg by mouth daily.     Marland Kitchen zolpidem (AMBIEN) 10 MG tablet TAKE ONE TABLET BY MOUTH AT BEDTIME AS NEEDED FOR SLEEP 30 tablet 1   No current facility-administered medications on file prior to visit.   Allergies  Allergen Reactions  . Ibuprofen Swelling    Throat swells shut  . Naproxen     Swell up and throat swells shut  . Pineapple   . Shellfish Allergy Hives, Itching and Swelling   Family History   Problem Relation Age of Onset  . Diabetes Mother   . Hypertension Mother   . Diabetes Father   . COPD Father   . Hypertension Father   . Hypertension Sister   . Kidney disease Sister   . Diabetes Maternal Grandmother   . Hypertension Maternal Grandmother   . Heart disease Maternal Grandmother   . Hypertension Paternal Grandmother   . Diabetes Paternal Grandfather   . Heart attack Neg Hx   . Stroke Neg Hx    PE: BP 128/90 mmHg  Pulse 90  Ht  (1.676  m)  Wt 237 lb (107.502 kg)  BMI 38.27 kg/m2  SpO2 95%  LMP 01/23/2016 Body mass index is 38.27 kg/(m^2). Wt Readings from Last 3 Encounters:  02/13/16 237 lb (107.502 kg)  09/12/15 241 lb (109.317 kg)  07/16/15 249 lb 2 oz (113.002 kg)   Constitutional: overweight, in NAD Eyes: PERRLA, EOMI, no exophthalmos ENT: moist mucous membranes, no thyromegaly, no cervical lymphadenopathy Cardiovascular: RRR, No MRG Respiratory: CTA B Gastrointestinal: abdomen soft, NT, ND, BS+ Musculoskeletal: no deformities, strength intact in all 4 Skin: moist, warm, no rashes Neurological: no tremor with outstretched hands, DTR normal in all 4  ASSESSMENT: 1. DM2, non-insulin-dependent, uncontrolled, with complications - + CAD - Dr Mendel RyderH. Smith  2. Obesity - class II BMI Classification:  < 18.5 underweight   18.5-24.9 normal weight   25.0-29.9 overweight   30.0-34.9 class I obesity   35.0-39.9 class II obesity   ? 40.0 class III obesity   3. HTG  PLAN:  1. Patient with long-standing, uncontrolled diabetes, on oral antidiabetic regimen + Victoza + added Glipizide at last visit. Sugars better after she started her low carb diet and walking daily, but they dramatically improved after adding Glipizide. They are almost all at goal, except few spikes in am >> improving. She also lost weight. Will continue current regimen. - I advised her to: Patient Instructions  Please continue: - Metformin 1000 mg 2x a day, with meals - Victoza 1.8  mg daily in am - Glipizide 2.5 mg 2x a day, before breakfast and before dinner.  Please return in 3 months with your sugar log.   KEEP UP THE GREAT JOB!  - check sugars at different times of the day - check 2 times a day, rotating checks - advised for yearly eye exams >> she is UTD - check A1c today >> 6.9% (EXcellent improvement) - Return to clinic in 3 mo with sugar log   2. Obesity - on Victoza - lost 4 more lbs since last visit despite a cruise >> she just returned - encouraged her to continue diet control and staying active  3. HTG - expect to improve with weight loss and improved DM control - she has an appt wit Dr Beverely Lowabori soon - will not re-check the TG now after her cruise.Marland Kitchen..Marland Kitchen

## 2016-02-13 NOTE — Patient Instructions (Signed)
Please continue: - Metformin 1000 mg 2x a day, with meals - Victoza 1.8 mg daily in am - Glipizide 2.5 mg 2x a day, before breakfast and before dinner.  Please return in 3 months with your sugar log.   KEEP UP THE GREAT JOB!

## 2016-05-02 ENCOUNTER — Other Ambulatory Visit: Payer: Self-pay | Admitting: Internal Medicine

## 2016-05-15 ENCOUNTER — Ambulatory Visit: Payer: BC Managed Care – PPO | Admitting: Internal Medicine

## 2016-05-15 DIAGNOSIS — Z0289 Encounter for other administrative examinations: Secondary | ICD-10-CM

## 2016-05-23 ENCOUNTER — Telehealth: Payer: Self-pay | Admitting: Emergency Medicine

## 2016-05-23 ENCOUNTER — Other Ambulatory Visit: Payer: Self-pay | Admitting: Emergency Medicine

## 2016-05-23 MED ORDER — ZOLPIDEM TARTRATE 10 MG PO TABS: 10.0000 mg | ORAL_TABLET | Freq: Every evening | ORAL | 1 refills | Status: AC | PRN

## 2016-05-23 NOTE — Telephone Encounter (Signed)
[facility] pharmacy faxed request for Zolpidem 10 mg.  Last rx: 09/03/15 Last Ov: 07/16/15 Rx declined, patient needs an appointment for further refills Faxed request to [facility] with denial.

## 2016-10-13 ENCOUNTER — Other Ambulatory Visit: Payer: Self-pay | Admitting: Family Medicine

## 2017-04-17 ENCOUNTER — Emergency Department (HOSPITAL_COMMUNITY): Payer: BC Managed Care – PPO

## 2017-04-17 ENCOUNTER — Encounter (HOSPITAL_COMMUNITY): Payer: Self-pay | Admitting: *Deleted

## 2017-04-17 ENCOUNTER — Emergency Department (HOSPITAL_COMMUNITY)
Admission: EM | Admit: 2017-04-17 | Discharge: 2017-04-17 | Disposition: A | Discharge status: Home / Self Care | Payer: BC Managed Care – PPO | Attending: Emergency Medicine | Admitting: Emergency Medicine

## 2017-04-17 DIAGNOSIS — I10 Essential (primary) hypertension: Secondary | ICD-10-CM | POA: Diagnosis not present

## 2017-04-17 DIAGNOSIS — Z7902 Long term (current) use of antithrombotics/antiplatelets: Secondary | ICD-10-CM | POA: Insufficient documentation

## 2017-04-17 DIAGNOSIS — R079 Chest pain, unspecified: Secondary | ICD-10-CM | POA: Diagnosis present

## 2017-04-17 DIAGNOSIS — R0789 Other chest pain: Secondary | ICD-10-CM | POA: Diagnosis not present

## 2017-04-17 DIAGNOSIS — Z79899 Other long term (current) drug therapy: Secondary | ICD-10-CM | POA: Insufficient documentation

## 2017-04-17 DIAGNOSIS — Z955 Presence of coronary angioplasty implant and graft: Secondary | ICD-10-CM | POA: Insufficient documentation

## 2017-04-17 DIAGNOSIS — Z7982 Long term (current) use of aspirin: Secondary | ICD-10-CM | POA: Diagnosis not present

## 2017-04-17 DIAGNOSIS — E1159 Type 2 diabetes mellitus with other circulatory complications: Secondary | ICD-10-CM | POA: Diagnosis not present

## 2017-04-17 DIAGNOSIS — Z794 Long term (current) use of insulin: Secondary | ICD-10-CM | POA: Insufficient documentation

## 2017-04-17 DIAGNOSIS — I2510 Atherosclerotic heart disease of native coronary artery without angina pectoris: Secondary | ICD-10-CM | POA: Insufficient documentation

## 2017-04-17 LAB — I-STAT TROPONIN, ED: TROPONIN I, POC: 0 ng/mL (ref 0.00–0.08)

## 2017-04-17 LAB — BASIC METABOLIC PANEL
ANION GAP: 9 (ref 5–15)
BUN: 12 mg/dL (ref 6–20)
CALCIUM: 9 mg/dL (ref 8.9–10.3)
CO2: 22 mmol/L (ref 22–32)
CREATININE: 0.89 mg/dL (ref 0.44–1.00)
Chloride: 103 mmol/L (ref 101–111)
Glucose, Bld: 244 mg/dL — ABNORMAL HIGH (ref 65–99)
Potassium: 4 mmol/L (ref 3.5–5.1)
SODIUM: 134 mmol/L — AB (ref 135–145)

## 2017-04-17 LAB — CBC
HCT: 37.3 % (ref 36.0–46.0)
HEMOGLOBIN: 12.5 g/dL (ref 12.0–15.0)
MCH: 29 pg (ref 26.0–34.0)
MCHC: 33.5 g/dL (ref 30.0–36.0)
MCV: 86.5 fL (ref 78.0–100.0)
PLATELETS: 351 10*3/uL (ref 150–400)
RBC: 4.31 MIL/uL (ref 3.87–5.11)
RDW: 12 % (ref 11.5–15.5)
WBC: 9.3 10*3/uL (ref 4.0–10.5)

## 2017-04-17 NOTE — ED Notes (Signed)
Chest pain earlier today  No chest pain now

## 2017-04-17 NOTE — Discharge Instructions (Signed)
You can take Tylenol needed for your chest pain. Use ice alternating 20 minutes on, 20 minutes off. Attempt stretching the area and avoid carrying heavy objects on that side until feeling better. Please follow-up with your primary care provider, as well as a cardiologist, for recheck. You can follow-up at [facility] or through [facility]. Please return to the emergency department if you develop any new or worsening symptoms.

## 2017-04-17 NOTE — ED Provider Notes (Signed)
[facility]-EMERGENCY DEPT Provider Note   CSN: [id number] Arrival date & time: 04/17/17  1432     History   Chief Complaint Chief Complaint  Patient presents with  . Chest Pain    HPI Barbara Jacobs is a 46 y.o. female with history of CAD, MI in 2010, diabetes, hypertension, hyperlipidemia who presents with a 1 day history of left-sided chest pain. Patient describes it as a discomfort. It is left-sided and radiates to her neck and left arm. She is had intermittent tingling to her left arm. Patient notes it feels like a pulled muscle, but she denies any heavy lifting. She does note that she wears her book bag and her purse, which are quite heavy, on her left shoulder. She notes her pain is worse with moving her arm. It is not pleuritic. Worse on exertion. She reports does not see a cardiologist at this time. She does still take carvedilol and lisinopril, but no longer takes Plavix. She denies any shortness of breath, diaphoresis, nausea, vomiting, abdominal pain, urinary symptoms. Patient has been under a lot of stress lately as she is planning the funeral of a close friend. Patient denies any recent long trips, surgeries, cancer, new leg pain or swelling, or history of blood clots.  HPI  Past Medical History:  Diagnosis Date  . Coronary artery disease    a. inferior STEMI 03/2009 s/p BMS to prox PDA. b. NSTEMI 04/2014: mild diffuse nonobstructive CAD, continued patency of previously placed PDA, no clear culprit. Amlodipine and Plavix added.  . H/O medication noncompliance   . Hyperlipidemia   . Hypertension   . Morbid obesity (HCC)   . Myocardial infarction (HCC)   . Type 2 diabetes mellitus with vascular disease [facility])     Patient Active Problem List   Diagnosis Date Noted  . Obesity, Class II, BMI 35-39.9, with comorbidity 02/13/2016  . Hypertriglyceridemia 02/13/2016  . Poorly controlled type 2 diabetes mellitus with circulatory disorder (HCC) 03/30/2015  . CAD (coronary artery disease)  05/04/2014  . Hyperlipidemia 08/31/2012  . HTN (hypertension) 08/31/2012  . PMDD (premenstrual dysphoric disorder) 08/31/2012    Past Surgical History:  Procedure Laterality Date  . CORONARY STENT PLACEMENT    . LEFT HEART CATHETERIZATION WITH CORONARY ANGIOGRAM N/A 04/17/2014   Procedure: LEFT HEART CATHETERIZATION WITH CORONARY ANGIOGRAM;  Surgeon: Micheline Chapman, MD;  Location: [facility] CATH LAB;  Service: Cardiovascular;  Laterality: N/A;    OB History    No data available       Home Medications    Prior to Admission medications   Medication Sig Start Date End Date Taking? Authorizing Provider  amLODipine (NORVASC) 5 MG tablet Take 1 tablet (5 mg total) by mouth daily. 04/18/14   Allayne Butcher, PA-C  aspirin EC 81 MG tablet Take 1 tablet (81 mg total) by mouth daily. 05/04/14   Dunn, Tacey Ruiz, PA-C  atorvastatin (LIPITOR) 80 MG tablet Take 1 tablet (80 mg total) by mouth daily at 6 PM. 07/18/15   Sheliah Hatch, MD  carvedilol (COREG) 6.25 MG tablet Take 1 tablet (6.25 mg total) by mouth 2 (two) times daily with a meal. 10/26/15   Sheliah Hatch, MD  clopidogrel (PLAVIX) 75 MG tablet Take 1 tablet (75 mg total) by mouth daily. 04/18/14   Robbie Lis M, PA-C  ERRIN 0.35 MG tablet  07/10/15   [provider]  famotidine (PEPCID) 20 MG tablet Take 1 tablet (20 mg total) by mouth 2 (two) times  daily. 05/04/14   Dunn, Tacey Ruiz, PA-C  fenofibrate 54 MG tablet Take 1 tablet (54 mg total) by mouth daily. 07/18/15   Sheliah Hatch, MD  glipiZIDE (GLUCOTROL) 5 MG tablet TAKE ONE TABLET BY MOUTH TWICE DAILY BEFORE A MEAL 05/02/16   Carlus Pavlov, MD  Insulin Pen Needle (FIFTY50 PEN NEEDLES) 32G X 4 MM MISC Use 1x a day 03/30/15   Carlus Pavlov, MD  Liraglutide (VICTOZA) 18 MG/3ML SOPN Inject 0.3 mLs (1.8 mg total) into the skin every morning. 03/30/15   Carlus Pavlov, MD  lisinopril (PRINIVIL,ZESTRIL) 40 MG tablet Take 1 tablet (40 mg total) by mouth  daily. 10/26/15   Sheliah Hatch, MD  metFORMIN (GLUCOPHAGE) 1000 MG tablet TAKE ONE TABLET BY MOUTH TWICE DAILY WITH A MEAL 05/02/16   Carlus Pavlov, MD  Multiple Vitamins-Minerals (MULTIVITAMIN WITH MINERALS) tablet Take 1 tablet by mouth daily.    [provider]  omega-3 acid ethyl esters (LOVAZA) 1 G capsule Take 1 capsule (1 g total) by mouth daily. 04/18/14   Robbie Lis M, PA-C  sertraline (ZOLOFT) 50 MG tablet Take 50 mg by mouth daily.  06/07/14   [provider]  zolpidem (AMBIEN) 10 MG tablet Take 1 tablet (10 mg total) by mouth at bedtime as needed. for sleep 05/23/16   Sheliah Hatch, MD    Family History Family History  Problem Relation Age of Onset  . Diabetes Mother   . Hypertension Mother   . Diabetes Father   . COPD Father   . Hypertension Father   . Hypertension Sister   . Kidney disease Sister   . Diabetes Maternal Grandmother   . Hypertension Maternal Grandmother   . Heart disease Maternal Grandmother   . Hypertension Paternal Grandmother   . Diabetes Paternal Grandfather   . Heart attack Neg Hx   . Stroke Neg Hx     Social History Social History  Substance Use Topics  . Smoking status: Never Smoker  . Smokeless tobacco: Never Used  . Alcohol use Yes     Comment: pt drinks twice a month     Allergies   Ibuprofen; Naproxen; Pineapple; and Shellfish allergy   Review of Systems Review of Systems  Constitutional: Negative for chills and fever.  HENT: Negative for facial swelling and sore throat.   Respiratory: Negative for shortness of breath.   Cardiovascular: Positive for chest pain.  Gastrointestinal: Negative for abdominal pain, nausea and vomiting.  Genitourinary: Negative for dysuria.  Musculoskeletal: Negative for back pain.  Skin: Negative for rash and wound.  Neurological: Negative for headaches.  Psychiatric/Behavioral: The patient is not nervous/anxious.      Physical Exam Updated Vital Signs BP  (!) 183/94 (BP Location: Left Arm)   Pulse 82   Temp 97.9 F (36.6 C) (Oral)   Resp 18   LMP 04/07/2017   SpO2 98%   Physical Exam  Constitutional: She appears well-developed and well-nourished. No distress.  HENT:  Head: Normocephalic and atraumatic.  Mouth/Throat: Oropharynx is clear and moist. No oropharyngeal exudate.  Eyes: Pupils are equal, round, and reactive to light. Conjunctivae are normal. Right eye exhibits no discharge. Left eye exhibits no discharge. No scleral icterus.  Neck: Normal range of motion. Neck supple. No thyromegaly present.  Cardiovascular: Normal rate, regular rhythm, normal heart sounds and intact distal pulses.  Exam reveals no gallop and no friction rub.   No murmur heard. Pulmonary/Chest: Effort normal and breath sounds normal. No stridor. No respiratory  distress. She has no wheezes. She has no rales. She exhibits no tenderness.  Abdominal: Soft. Bowel sounds are normal. She exhibits no distension. There is no tenderness. There is no rebound and no guarding.  Musculoskeletal: She exhibits no edema.  Lymphadenopathy:    She has no cervical adenopathy.  Neurological: She is alert. Coordination normal.  Skin: Skin is warm and dry. No rash noted. She is not diaphoretic. No pallor.  Psychiatric: She has a normal mood and affect.  Nursing note and vitals reviewed.    ED Treatments / Results  Labs (all labs ordered are listed, but only abnormal results are displayed) Labs Reviewed  BASIC METABOLIC PANEL - Abnormal; Notable for the following:       Result Value   Sodium 134 (*)    Glucose, Bld 244 (*)    All other components within normal limits  CBC  I-STAT TROPONIN, ED    EKG  EKG Interpretation  Date/Time:  Friday April 17 2017 14:38:17 EDT Ventricular Rate:  83 PR Interval:  166 QRS Duration: 80 QT Interval:  342 QTC Calculation: 401 R Axis:   0 Text Interpretation:  Normal sinus rhythm Inferior infarct , age undetermined Anterior  infarct , age undetermined Abnormal ECG lateral ischemic changesfrom09/13/2015 have resolved Confirmed by Doug Sou [phone]) on 04/17/2017 4:23:22 PM       Radiology Dg Chest 2 View  Result Date: 04/17/2017 CLINICAL DATA:  Left-sided chest pain with involvement of the left arm. EXAM: CHEST  2 VIEW COMPARISON:  04/14/2014 FINDINGS: The cardiac silhouette is borderline enlarged. There is unchanged mild anterior eventration of the right hemidiaphragm. The lungs are clear. No pleural effusion or pneumothorax is identified. No acute osseous abnormality is seen. IMPRESSION: No active cardiopulmonary disease. Electronically Signed   By: Sebastian Ache M.D.   On: 04/17/2017 15:08    Procedures Procedures (including critical care time)  Medications Ordered in ED Medications - No data to display   Initial Impression / Assessment and Plan / ED Course  I have reviewed the triage vital signs and the nursing notes.  Pertinent labs & imaging results that were available during my care of the patient were reviewed by me and considered in my medical decision making (see chart for details).     Patient with left-sided chest pain. Most likely musculoskeletal, as it is reproducible with arm movement and improves with sitting still. CBC unremarkable. BMP shows elevated glucose, however otherwise within normal limits. Troponin negative. Her symptoms have been constant since 8 AM this morning, repeat not indicated. Chest x-ray is negative. No new findings on EKG. While patient follow-up with PCP and cardiology. Supportive treatment discussed. Return precautions discussed as well. Patient reports just before she arrived, she was discussing funeral arrangements, causing her blood pressure to rise. Has been lower throughout her ED course, 150 systolic. Patient understands and agrees with plan. Patient discharged in satisfactory condition. Patient also evaluated by Dr. Ethelda Chick who cut of the patient's management  and agrees with plan.  Final Clinical Impressions(s) / ED Diagnoses   Final diagnoses:  Atypical chest pain    New Prescriptions New Prescriptions   No medications on file     Verdis Prime 04/17/17 1740    Doug Sou, MD 04/18/17 [phone]

## 2017-04-17 NOTE — ED Provider Notes (Signed)
Complained of anteriorchest pain onset last night constant. Worse with changing positions improved with remaining still. Does not feel like MI that she's had in the past which felt like heart pain. This pain feels like muscle pull that she's expansion her past. No treatment prior to coming here. Associated symptoms include tingling in her left hand. Discomfort is nonexertional is worse with changing positions improved with remaining still. On exam no distress lungs clear auscultation heart regular rate and rhythm abdomen nondistended nontender extremities without edema chest pain is easily reproducible by forcible abduction of left shoulder   Doug Sou, MD 04/17/17 1737

## 2017-04-17 NOTE — ED Triage Notes (Signed)
Pt reports onset yesterday of left side chest discomfort, today is having pain and tingling down left arm. No resp distress is noted. Pt has cardiac hx.

## 2017-05-07 ENCOUNTER — Other Ambulatory Visit: Payer: Self-pay | Admitting: Obstetrics and Gynecology

## 2017-05-07 DIAGNOSIS — Z1231 Encounter for screening mammogram for malignant neoplasm of breast: Secondary | ICD-10-CM

## 2017-06-05 ENCOUNTER — Ambulatory Visit
Admission: RE | Admit: 2017-06-05 | Discharge: 2017-06-05 | Disposition: A | Discharge status: Home / Self Care | Payer: BC Managed Care – PPO | Source: Ambulatory Visit | Attending: Obstetrics and Gynecology | Admitting: Obstetrics and Gynecology

## 2017-06-05 DIAGNOSIS — Z1231 Encounter for screening mammogram for malignant neoplasm of breast: Secondary | ICD-10-CM

## 2017-06-05 IMAGING — MG DIGITAL SCREENING BILATERAL MAMMOGRAM WITH CAD
6 series · 6 of 6 positions shown · non-contrast
Comparison: Previous exam(s).

CLINICAL DATA: Screening.

EXAM:
DIGITAL SCREENING BILATERAL MAMMOGRAM WITH CAD

[L CC (1 of 2)]
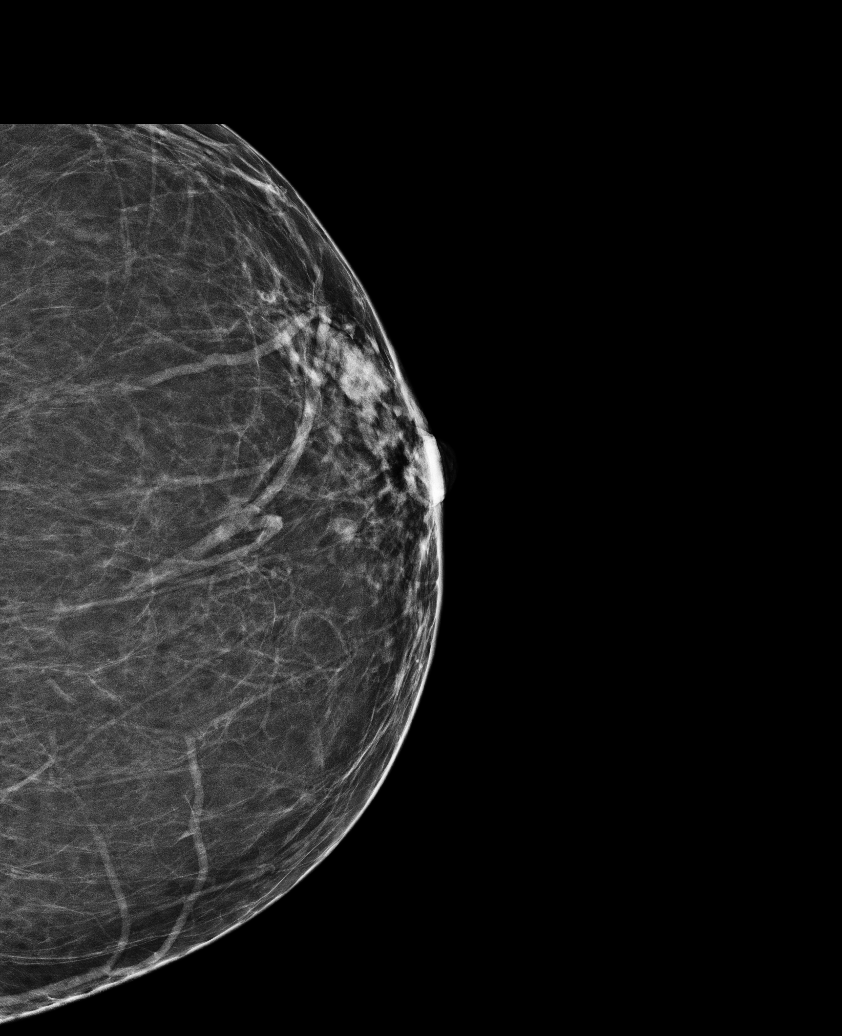

[R MLO]
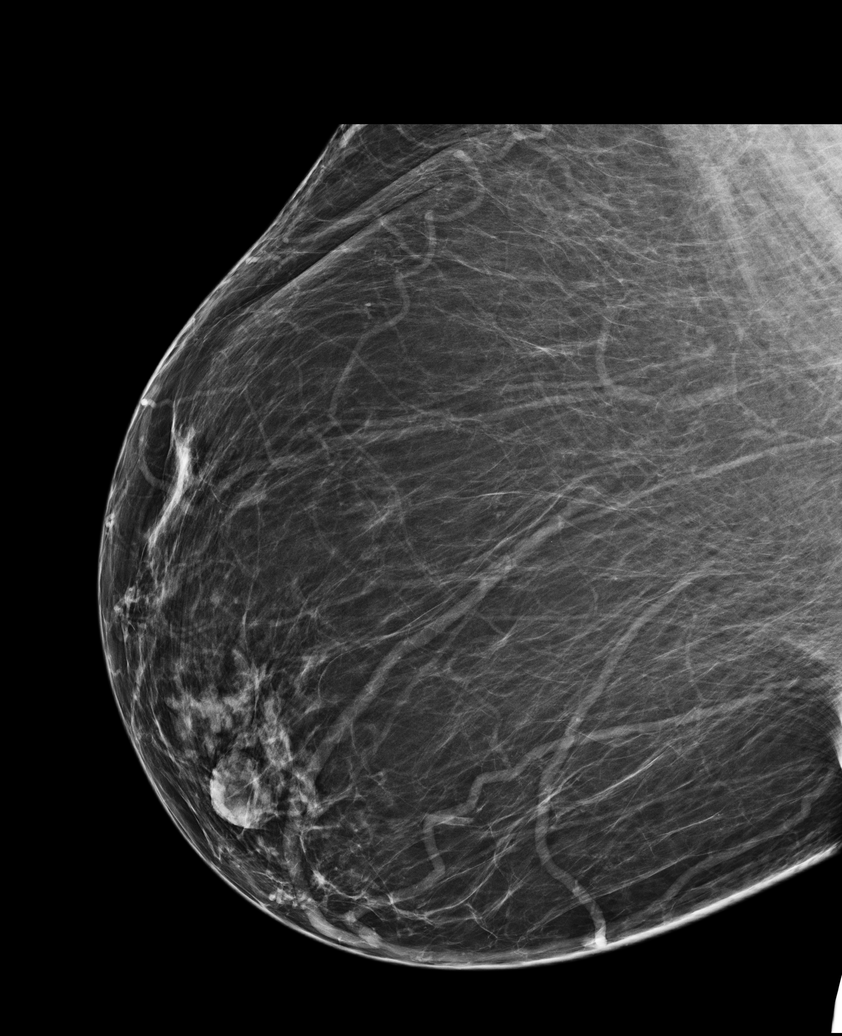

[R CC (1 of 2)]
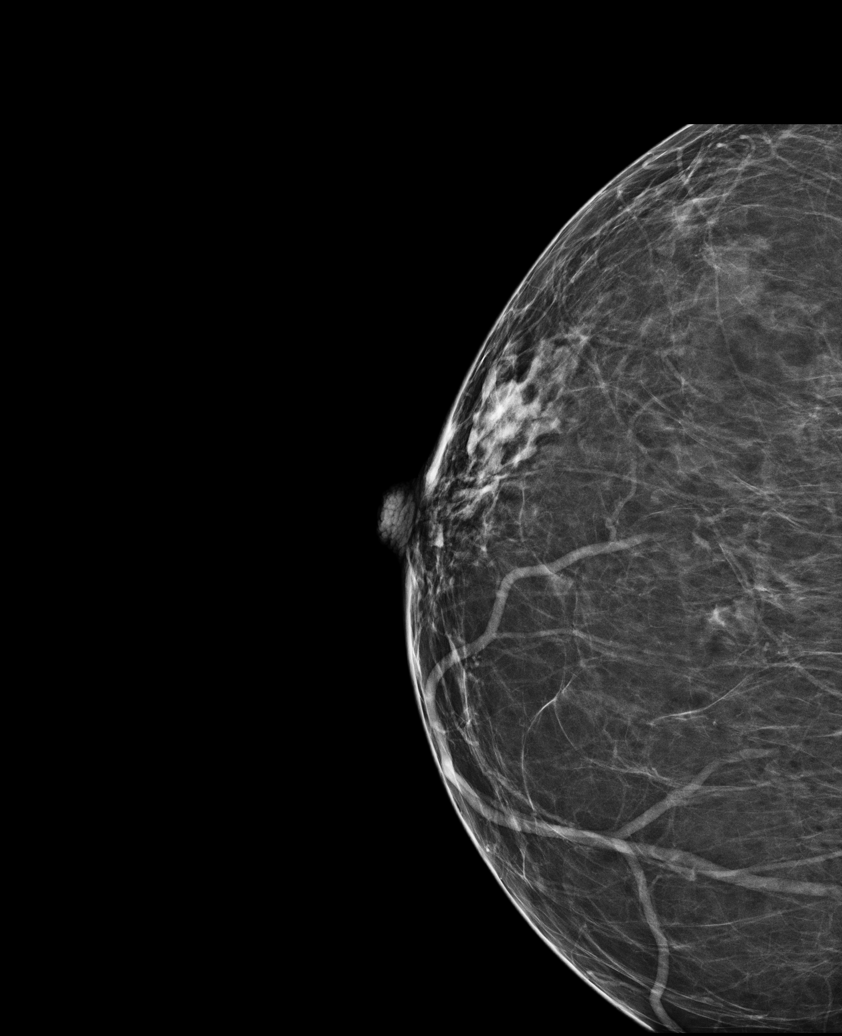

[L CC (2 of 2)]
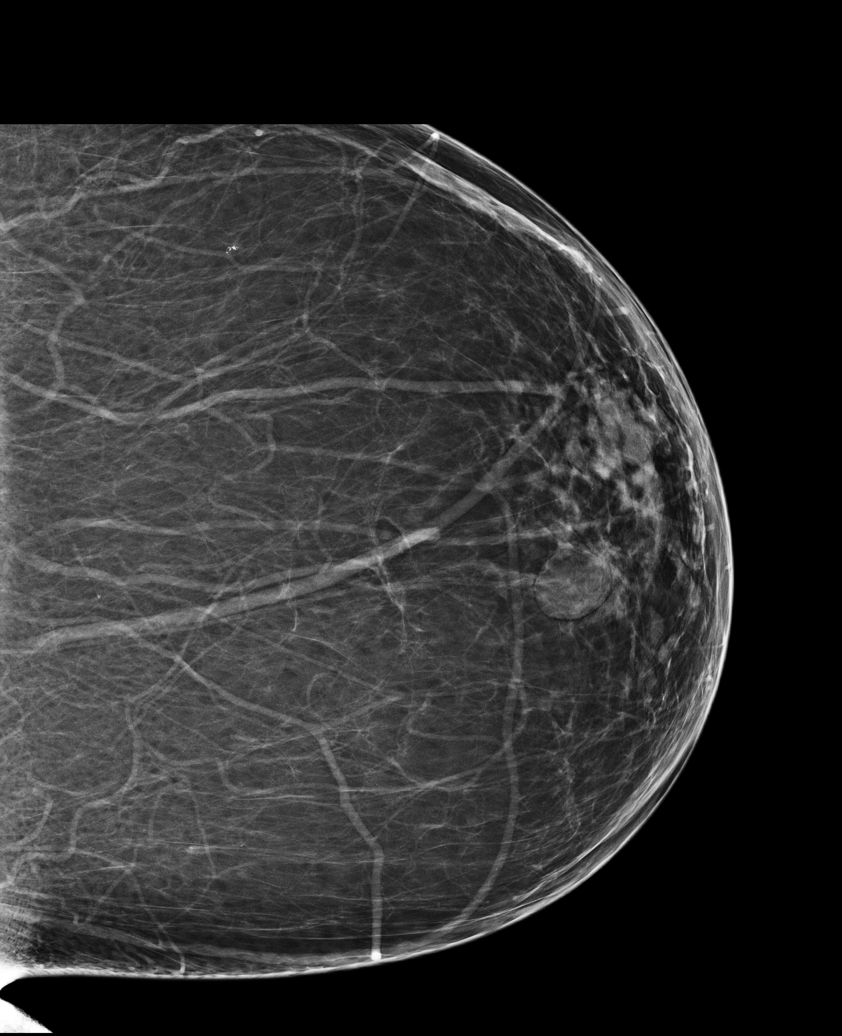

[L MLO]
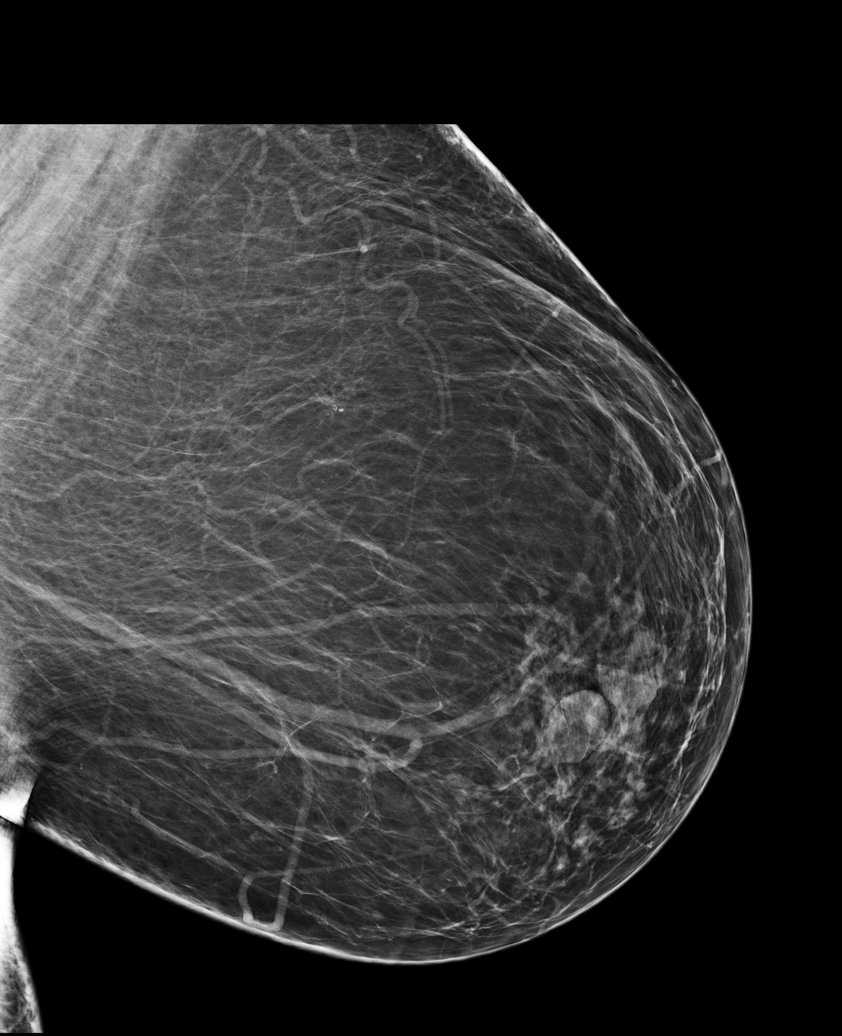

[R CC (2 of 2)]
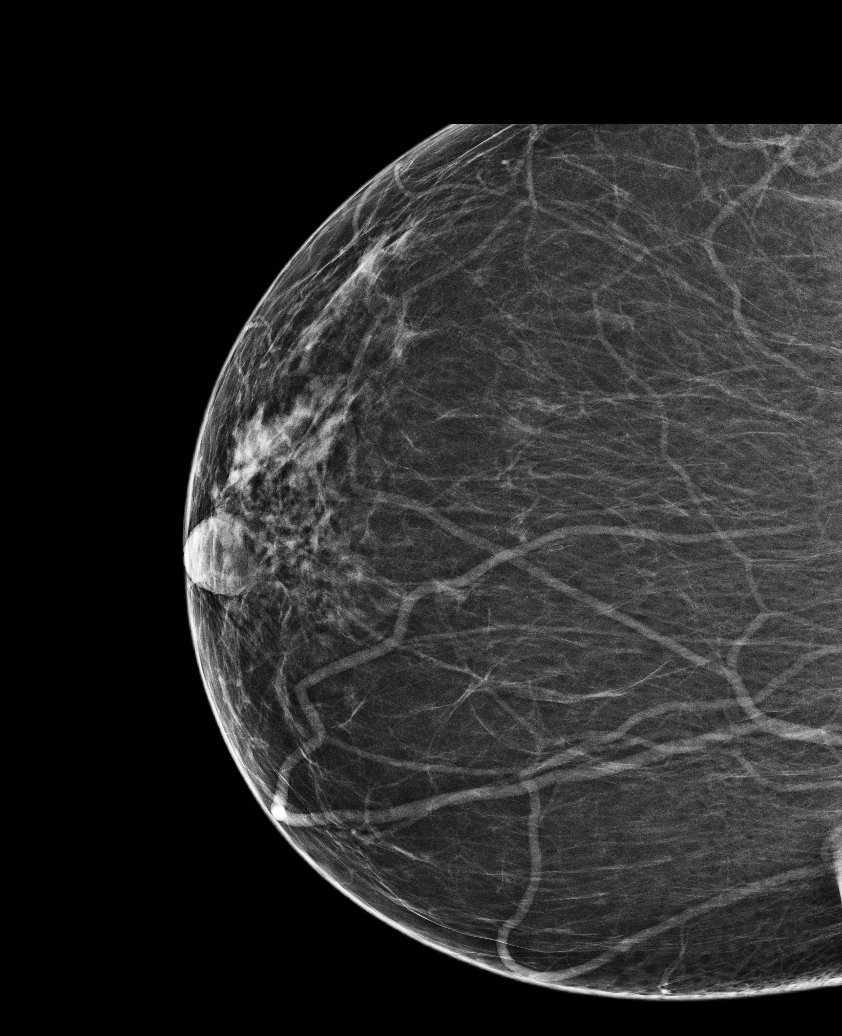

[6 of 6 positions shown; findings below may reference images not displayed]

ACR Breast Density Category b: There are scattered areas of
fibroglandular density.
FINDINGS: There are no findings suspicious for malignancy. Images were
processed with CAD.
IMPRESSION: No mammographic evidence of malignancy. A result letter of this
screening mammogram will be mailed directly to the patient.

RECOMMENDATION:
Screening mammogram in one year. (Code:[US])

BI-RADS CATEGORY  1: Negative.

## 2018-01-29 ENCOUNTER — Ambulatory Visit: Payer: BC Managed Care – PPO | Admitting: Interventional Cardiology

## 2018-02-12 ENCOUNTER — Emergency Department (HOSPITAL_COMMUNITY)
Admission: EM | Admit: 2018-02-12 | Discharge: 2018-02-12 | Discharge status: Against Medical Advice | Payer: BC Managed Care – PPO | Attending: Emergency Medicine | Admitting: Emergency Medicine

## 2018-02-12 ENCOUNTER — Encounter (HOSPITAL_COMMUNITY): Payer: Self-pay | Admitting: *Deleted

## 2018-02-12 ENCOUNTER — Other Ambulatory Visit: Payer: Self-pay

## 2018-02-12 DIAGNOSIS — R51 Headache: Secondary | ICD-10-CM | POA: Insufficient documentation

## 2018-02-12 DIAGNOSIS — Z5321 Procedure and treatment not carried out due to patient leaving prior to being seen by health care provider: Secondary | ICD-10-CM | POA: Diagnosis not present

## 2018-02-12 NOTE — ED Notes (Signed)
Patient states she feels better, and she no longer wants to be seen.  This RN encouraged patient to wait to be seen for her ER visit and for her symptoms.  Patient states "I'm good" and handed me her stickers and left the waiting room

## 2018-02-12 NOTE — ED Triage Notes (Signed)
The pt has had a headache since 0400am today  She has also had nausea  Hx of headaches  She has taken only her bp meds for the headache

## 2018-02-15 NOTE — ED Notes (Signed)
Follow up call made  No answer  02/15/18  1148  s Brandom Kerwin rn

## 2019-03-17 ENCOUNTER — Other Ambulatory Visit: Payer: Self-pay

## 2019-03-17 ENCOUNTER — Emergency Department (HOSPITAL_COMMUNITY)
Admission: EM | Admit: 2019-03-17 | Discharge: 2019-03-18 | Disposition: A | Discharge status: Home / Self Care | Payer: BC Managed Care – PPO | Attending: Emergency Medicine | Admitting: Emergency Medicine

## 2019-03-17 DIAGNOSIS — I2510 Atherosclerotic heart disease of native coronary artery without angina pectoris: Secondary | ICD-10-CM | POA: Diagnosis not present

## 2019-03-17 DIAGNOSIS — R072 Precordial pain: Secondary | ICD-10-CM

## 2019-03-17 DIAGNOSIS — I252 Old myocardial infarction: Secondary | ICD-10-CM | POA: Insufficient documentation

## 2019-03-17 DIAGNOSIS — E119 Type 2 diabetes mellitus without complications: Secondary | ICD-10-CM | POA: Insufficient documentation

## 2019-03-17 DIAGNOSIS — Z7982 Long term (current) use of aspirin: Secondary | ICD-10-CM | POA: Insufficient documentation

## 2019-03-17 DIAGNOSIS — Z79899 Other long term (current) drug therapy: Secondary | ICD-10-CM | POA: Diagnosis not present

## 2019-03-17 DIAGNOSIS — Z7984 Long term (current) use of oral hypoglycemic drugs: Secondary | ICD-10-CM | POA: Insufficient documentation

## 2019-03-17 DIAGNOSIS — I10 Essential (primary) hypertension: Secondary | ICD-10-CM | POA: Insufficient documentation

## 2019-03-17 DIAGNOSIS — R0789 Other chest pain: Secondary | ICD-10-CM | POA: Diagnosis present

## 2019-03-17 LAB — CBC
HCT: 39.2 % (ref 36.0–46.0)
Hemoglobin: 12.7 g/dL (ref 12.0–15.0)
MCH: 29.5 pg (ref 26.0–34.0)
MCHC: 32.4 g/dL (ref 30.0–36.0)
MCV: 91.2 fL (ref 80.0–100.0)
Platelets: 382 10*3/uL (ref 150–400)
RBC: 4.3 MIL/uL (ref 3.87–5.11)
RDW: 11.9 % (ref 11.5–15.5)
WBC: 12 10*3/uL — ABNORMAL HIGH (ref 4.0–10.5)
nRBC: 0 % (ref 0.0–0.2)

## 2019-03-17 LAB — BASIC METABOLIC PANEL
Anion gap: 12 (ref 5–15)
BUN: 16 mg/dL (ref 6–20)
CO2: 19 mmol/L — ABNORMAL LOW (ref 22–32)
Calcium: 9.5 mg/dL (ref 8.9–10.3)
Chloride: 101 mmol/L (ref 98–111)
Creatinine, Ser: 0.94 mg/dL (ref 0.44–1.00)
GFR calc Af Amer: >60 mL/min (ref >60)
GFR calc non Af Amer: >60 mL/min (ref >60)
Glucose, Bld: 267 mg/dL — ABNORMAL HIGH (ref 70–99)
Potassium: 4.6 mmol/L (ref 3.5–5.1)
Sodium: 132 mmol/L — ABNORMAL LOW (ref 135–145)

## 2019-03-17 LAB — HCG, QUANTITATIVE, PREGNANCY: hCG, Beta Chain, Quant, S: 1 m[IU]/mL (ref <5)

## 2019-03-17 LAB — TROPONIN I (HIGH SENSITIVITY): Troponin I (High Sensitivity): 7 ng/L (ref <18)

## 2019-03-17 MED ORDER — SODIUM CHLORIDE 0.9% FLUSH: 3.0000 mL | Freq: Once | INTRAVENOUS | Status: DC

## 2019-03-17 NOTE — ED Triage Notes (Signed)
Pt reports L-side, non radiating CP x 3days. Pt denies dizziness, SHOB, or nausea. Pt reports hx of MI, HTN, and DM

## 2019-03-18 ENCOUNTER — Emergency Department (HOSPITAL_COMMUNITY): Payer: BC Managed Care – PPO

## 2019-03-18 LAB — TROPONIN I (HIGH SENSITIVITY): Troponin I (High Sensitivity): 7 ng/L (ref <18)

## 2019-03-18 IMAGING — CR CHEST - 2 VIEW
2 series · 2 of 2 positions shown · non-contrast
Comparison: None.

CLINICAL DATA: 48-year-old female with chest pain for 3 days.

EXAM:
CHEST - 2 VIEW

[chest pa]
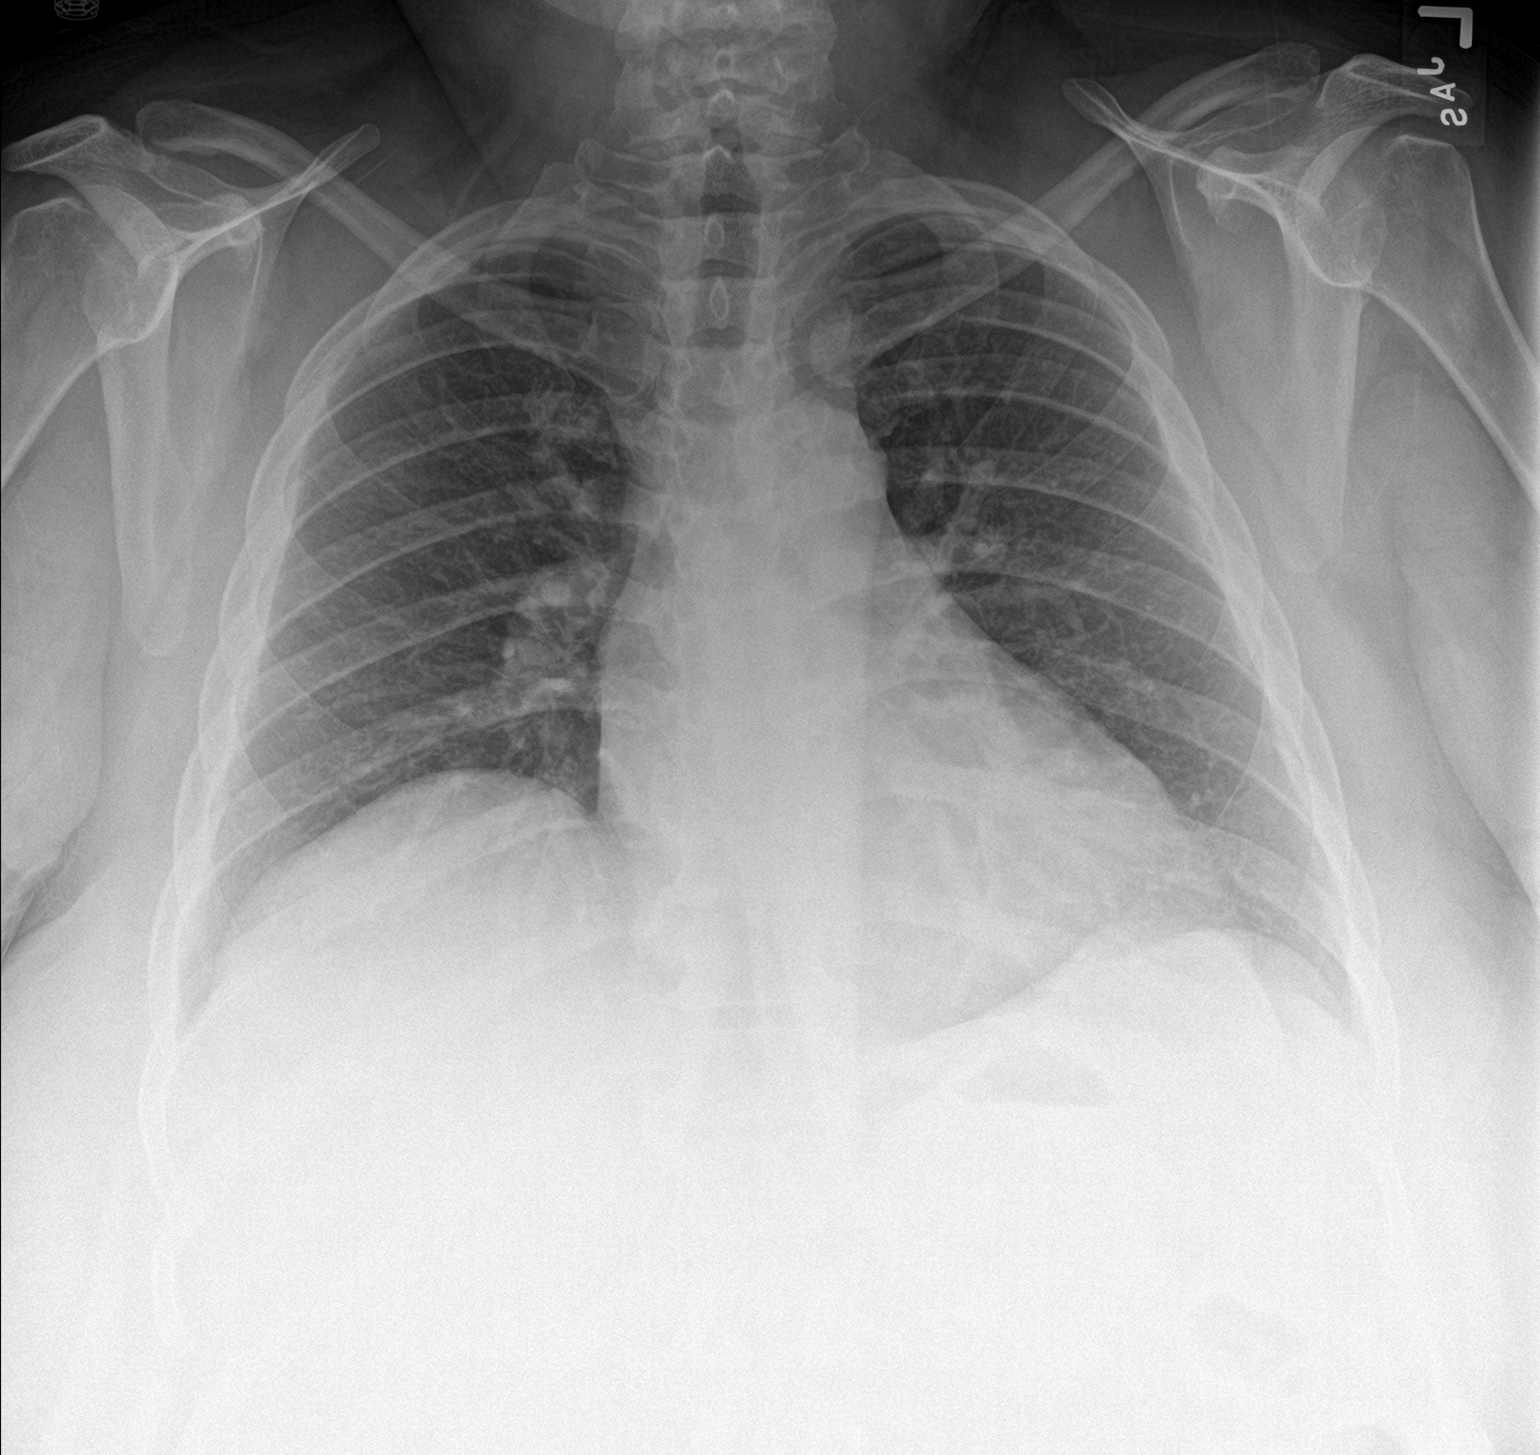

[chest lat]
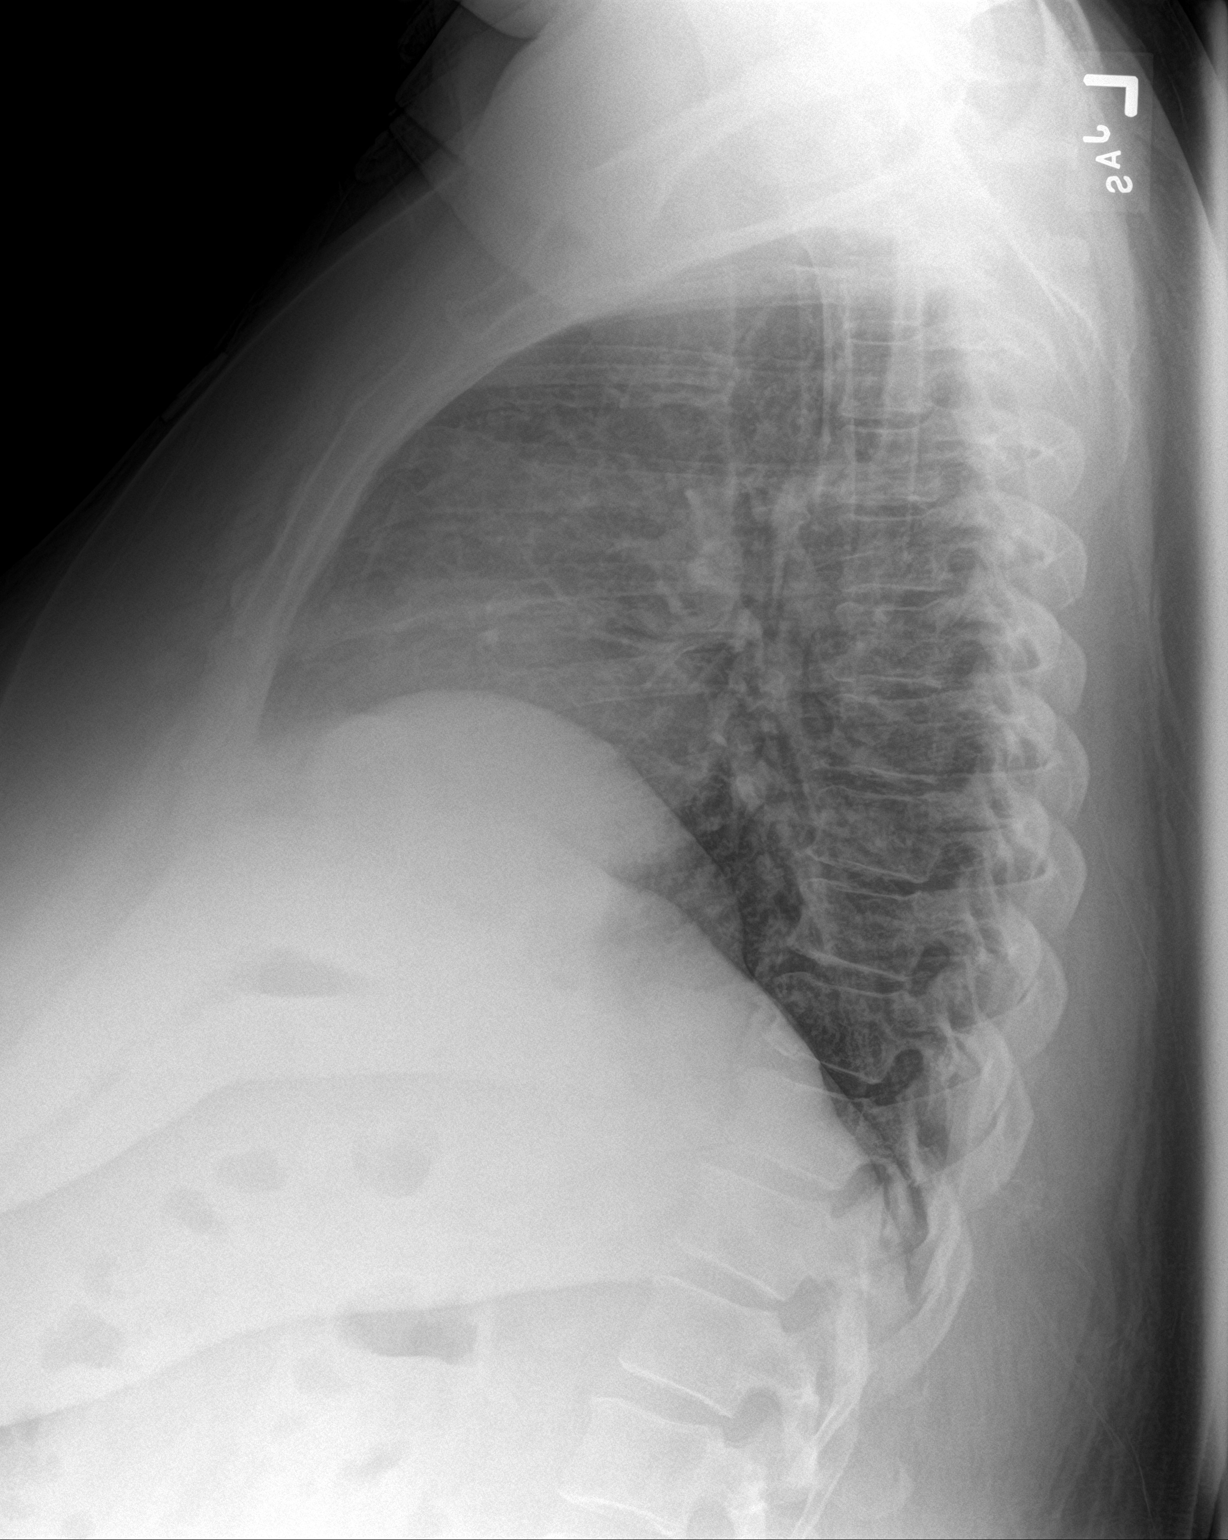

[2 of 2 positions shown; findings below may reference images not displayed]

FINDINGS: Low lung volumes. Mild elevation or eventration of the diaphragm.
Cardiac size at the upper limits of normal. Other mediastinal
contours are within normal limits. Visualized tracheal air column is
within normal limits. Both lungs appear clear. No pneumothorax or
pleural effusion.

No acute osseous abnormality identified. Negative visible bowel gas
pattern.
IMPRESSION: Low lung volumes.  No acute cardiopulmonary abnormality.

## 2019-03-18 NOTE — ED Notes (Signed)
Patient transported to X-ray 

## 2019-03-18 NOTE — Discharge Instructions (Signed)

## 2019-03-18 NOTE — ED Provider Notes (Signed)
[facility] EMERGENCY DEPARTMENT Provider Note   CSN: [id number] Arrival date & time: 03/17/19  2055    History   Chief Complaint Chief Complaint  Patient presents with  . Chest Pain    HPI Barbara Jacobs is a 48 y.o. female.  HPI: A 48 year old patient with a history of treated diabetes, hypertension and hypercholesterolemia presents for evaluation of chest pain. Initial onset of pain was approximately 3-6 hours ago. The patient's chest pain is well-localized, is sharp and is not worse with exertion. The patient's chest pain is middle- or left-sided, is not described as heaviness/pressure/tightness and does not radiate to the arms/jaw/neck. The patient does not complain of nausea and denies diaphoresis. The patient has no history of stroke, has no history of peripheral artery disease, has not smoked in the past 90 days, has no relevant family history of coronary artery disease (first degree relative at less than age 38) and does not have an elevated BMI (>=30).    Chest Pain Associated symptoms: no cough, no diaphoresis, no fever, no nausea, no shortness of breath, no vomiting and no weakness   Patient with known history of CAD presents with chest pain.  She has had brief episodes of left-sided sharp chest pain for the past week.  She reports it occurs 2-3 times per day.  She reports it comes at random.  No pleuritic pain.  No shortness of breath.  No chest pain on exertion.  No dyspnea on exertion No history of PE This does not feel like prior MI.  No diaphoresis.  She reports tonight she did get very anxious with the pain and due to stressful work situation, and she did have a brief numbness in the left hand She now feels at baseline without any recent pain Past Medical History:  Diagnosis Date  . CAD (coronary artery disease) 05/04/2014  . Coronary artery disease    a. inferior STEMI 03/2009 s/p BMS to prox PDA. b. NSTEMI 04/2014: mild diffuse nonobstructive CAD,  continued patency of previously placed PDA, no clear culprit. Amlodipine and Plavix added.  . H/O medication noncompliance   . HTN (hypertension) 08/31/2012  . Hyperlipidemia   . Hypertension   . Hypertriglyceridemia 02/13/2016  . Morbid obesity ([place])   . Myocardial infarction ([place])   . Obesity, Class II, BMI 35-39.9, with comorbidity 02/13/2016  . PMDD (premenstrual dysphoric disorder) 08/31/2012  . Poorly controlled type 2 diabetes mellitus with circulatory disorder ([place]) 03/30/2015  . Type 2 diabetes mellitus with vascular disease [facility])     Patient Active Problem List   Diagnosis Date Noted  . Obesity, Class II, BMI 35-39.9, with comorbidity 02/13/2016  . Hypertriglyceridemia 02/13/2016  . Poorly controlled type 2 diabetes mellitus with circulatory disorder ([place]) 03/30/2015  . CAD (coronary artery disease) 05/04/2014  . Hyperlipidemia 08/31/2012  . HTN (hypertension) 08/31/2012  . PMDD (premenstrual dysphoric disorder) 08/31/2012    Past Surgical History:  Procedure Laterality Date  . CORONARY STENT PLACEMENT    . LEFT HEART CATHETERIZATION WITH CORONARY ANGIOGRAM N/A 04/17/2014   Procedure: LEFT HEART CATHETERIZATION WITH CORONARY ANGIOGRAM;  Surgeon: Blane Ohara, MD;  Location: [facility] CATH LAB;  Service: Cardiovascular;  Laterality: N/A;     OB History   No obstetric history on file.      Home Medications    Prior to Admission medications   Medication Sig Start Date End Date Taking? Authorizing Provider  amLODipine (NORVASC) 5 MG tablet Take 1 tablet (5 mg total)  by mouth daily. 04/18/14   Allayne ButcherSimmons, Brittainy M, PA-C  aspirin EC 81 MG tablet Take 1 tablet (81 mg total) by mouth daily. 05/04/14   Dunn, Tacey Ruizayna N, PA-C  atorvastatin (LIPITOR) 80 MG tablet Take 1 tablet (80 mg total) by mouth daily at 6 PM. 07/18/15   Sheliah Hatchabori, Katherine E, MD  carvedilol (COREG) 6.25 MG tablet Take 1 tablet (6.25 mg total) by mouth 2 (two) times daily with a meal. 10/26/15   Sheliah Hatchabori, Katherine E, MD   clopidogrel (PLAVIX) 75 MG tablet Take 1 tablet (75 mg total) by mouth daily. 04/18/14   Robbie LisSimmons, Brittainy M, PA-C  ERRIN 0.35 MG tablet  07/10/15   [provider]  famotidine (PEPCID) 20 MG tablet Take 1 tablet (20 mg total) by mouth 2 (two) times daily. 05/04/14   Dunn, Tacey Ruizayna N, PA-C  fenofibrate 54 MG tablet Take 1 tablet (54 mg total) by mouth daily. 07/18/15   Sheliah Hatchabori, Katherine E, MD  glipiZIDE (GLUCOTROL) 5 MG tablet TAKE ONE TABLET BY MOUTH TWICE DAILY BEFORE A MEAL 05/02/16   Carlus PavlovGherghe, Cristina, MD  Insulin Pen Needle (FIFTY50 PEN NEEDLES) 32G X 4 MM MISC Use 1x a day 03/30/15   Carlus PavlovGherghe, Cristina, MD  Liraglutide (VICTOZA) 18 MG/3ML SOPN Inject 0.3 mLs (1.8 mg total) into the skin every morning. 03/30/15   Carlus PavlovGherghe, Cristina, MD  lisinopril (PRINIVIL,ZESTRIL) 40 MG tablet Take 1 tablet (40 mg total) by mouth daily. 10/26/15   Sheliah Hatchabori, Katherine E, MD  metFORMIN (GLUCOPHAGE) 1000 MG tablet TAKE ONE TABLET BY MOUTH TWICE DAILY WITH A MEAL 05/02/16   Carlus PavlovGherghe, Cristina, MD  Multiple Vitamins-Minerals (MULTIVITAMIN WITH MINERALS) tablet Take 1 tablet by mouth daily.    [provider]  omega-3 acid ethyl esters (LOVAZA) 1 G capsule Take 1 capsule (1 g total) by mouth daily. 04/18/14   Robbie LisSimmons, Brittainy M, PA-C  sertraline (ZOLOFT) 50 MG tablet Take 50 mg by mouth daily.  06/07/14   [provider]  zolpidem (AMBIEN) 10 MG tablet Take 1 tablet (10 mg total) by mouth at bedtime as needed. for sleep 05/23/16   Sheliah Hatchabori, Katherine E, MD    Family History Family History  Problem Relation Age of Onset  . Diabetes Mother   . Hypertension Mother   . Breast cancer Mother   . Diabetes Father   . COPD Father   . Hypertension Father   . Hypertension Sister   . Kidney disease Sister   . Diabetes Maternal Grandmother   . Hypertension Maternal Grandmother   . Heart disease Maternal Grandmother   . Hypertension Paternal Grandmother   . Diabetes Paternal Grandfather   . Heart attack  Neg Hx   . Stroke Neg Hx     Social History Social History   Tobacco Use  . Smoking status: Never Smoker  . Smokeless tobacco: Never Used  Substance Use Topics  . Alcohol use: Yes    Comment: pt drinks twice a month  . Drug use: No     Allergies   Ibuprofen, Naproxen, Pineapple, and Shellfish allergy   Review of Systems Review of Systems  Constitutional: Negative for diaphoresis and fever.  Respiratory: Negative for cough and shortness of breath.   Cardiovascular: Positive for chest pain.  Gastrointestinal: Negative for nausea and vomiting.  Neurological: Negative for weakness.  All other systems reviewed and are negative.    Physical Exam Updated Vital Signs BP (!) 104/55   Pulse 73   Temp 98.6 F (37 C) (Oral)  Resp 18   SpO2 98%   Physical Exam CONSTITUTIONAL: Well developed/well nourished HEAD: Normocephalic/atraumatic EYES: EOMI/PERRL ENMT: Mucous membranes moist NECK: supple no meningeal signs SPINE/BACK:entire spine nontender CV: S1/S2 noted, no murmurs/rubs/gallops noted Chest-no tenderness LUNGS: Lungs are clear to auscultation bilaterally, no apparent distress ABDOMEN: soft, nontender, no rebound or guarding, bowel sounds noted throughout abdomen GU:no cva tenderness NEURO: Pt is awake/alert/appropriate, moves all extremitiesx4.  No facial droop.   EXTREMITIES: pulses normal/equal x4, full ROM, no calf tenderness or edema SKIN: warm, color normal PSYCH: no abnormalities of mood noted, alert and oriented to situation   ED Treatments / Results  Labs (all labs ordered are listed, but only abnormal results are displayed) Labs Reviewed  BASIC METABOLIC PANEL - Abnormal; Notable for the following components:      Result Value   Sodium 132 (*)    CO2 19 (*)    Glucose, Bld 267 (*)    All other components within normal limits  CBC - Abnormal; Notable for the following components:   WBC 12.0 (*)    All other components within normal limits   HCG, QUANTITATIVE, PREGNANCY  TROPONIN I (HIGH SENSITIVITY)  TROPONIN I (HIGH SENSITIVITY)    EKG EKG Interpretation  Date/Time:  Thursday March 17 2019 21:03:12 EDT Ventricular Rate:  84 PR Interval:  150 QRS Duration: 78 QT Interval:  372 QTC Calculation: 439 R Axis:   3 Text Interpretation:  Normal sinus rhythm Inferior infarct , age undetermined Cannot rule out Anterior infarct , age undetermined T wave abnormality, consider lateral ischemia Abnormal ECG No previous ECGs available Interpretation limited secondary to artifact Confirmed by Zadie RhineWickline, Aariona Momon ([id number]) on 03/18/2019 1:39:54 AM   Radiology Dg Chest 2 View  Result Date: 03/18/2019 CLINICAL DATA:  48 year old female with chest pain for 3 days. EXAM: CHEST - 2 VIEW COMPARISON:  None. FINDINGS: Low lung volumes. Mild elevation or eventration of the diaphragm. Cardiac size at the upper limits of normal. Other mediastinal contours are within normal limits. Visualized tracheal air column is within normal limits. Both lungs appear clear. No pneumothorax or pleural effusion. No acute osseous abnormality identified. Negative visible bowel gas pattern. IMPRESSION: Low lung volumes.  No acute cardiopulmonary abnormality. Electronically Signed   By: Odessa FlemingH  Hall M.D.   On: 03/18/2019 02:23    Procedures Procedures   Medications Ordered in ED Medications  sodium chloride flush (NS) 0.9 % injection 3 mL (has no administration in time range)     Initial Impression / Assessment and Plan / ED Course  I have reviewed the triage vital signs and the nursing notes.  Pertinent labs & imaging results that were available during my care of the patient were reviewed by me and considered in my medical decision making (see chart for details).     HEAR Score: 4  Other than hyperglycemia, labs reassuring.  Troponin is negative.  No ischemic changes on EKG.  She reports the chest pain is very brief for a few seconds and is sharp.  Not pleuritic.   Low suspicion for PE.  I have low suspicion for ACS or dissection.  Patient is well-appearing. I feel patient is appropriate and safe for discharge home.  We discussed strict ER return precautions Final Clinical Impressions(s) / ED Diagnoses   Final diagnoses:  Precordial pain    ED Discharge Orders    None       Zadie RhineWickline, Qiana Landgrebe, MD 03/18/19 [phone]

## 2019-09-29 ENCOUNTER — Ambulatory Visit: Payer: BC Managed Care – PPO | Attending: Internal Medicine

## 2019-09-29 DIAGNOSIS — Z23 Encounter for immunization: Secondary | ICD-10-CM | POA: Insufficient documentation

## 2019-09-29 NOTE — Progress Notes (Signed)
   Covid-19 Vaccination Clinic  Name:  JENAYE RICKERT    MRN: [id number] DOB: 1971/07/14  09/29/2019  [name] was observed post Covid-19 immunization for 30 minutes based on pre-vaccination screening without incidence. She was provided with Vaccine Information Sheet and instruction to access the V-Safe system.   Ms. Taite was instructed to call 911 with any severe reactions post vaccine: Marland Kitchen Difficulty breathing  . Swelling of your face and throat  . A fast heartbeat  . A bad rash all over your body  . Dizziness and weakness    Immunizations Administered    Name Date Dose VIS Date Route   Pfizer COVID-19 Vaccine 09/29/2019  3:24 PM 0.3 mL 07/15/2019 Intramuscular   Manufacturer: [employer], [employer]   Lot: [id number]   NDC: [address]-3

## 2019-10-25 ENCOUNTER — Ambulatory Visit: Payer: BC Managed Care – PPO | Attending: Internal Medicine

## 2019-10-25 DIAGNOSIS — Z23 Encounter for immunization: Secondary | ICD-10-CM

## 2019-10-25 NOTE — Progress Notes (Signed)
   Covid-19 Vaccination Clinic  Name:  Barbara Jacobs    MRN: [id number] DOB: 09-13-1970  10/25/2019  [name] was observed post Covid-19 immunization for 30 minutes based on pre-vaccination screening without incident. She was provided with Vaccine Information Sheet and instruction to access the V-Safe system.   [name] was instructed to call 911 with any severe reactions post vaccine: Marland Kitchen Difficulty breathing  . Swelling of face and throat  . A fast heartbeat  . A bad rash all over body  . Dizziness and weakness   Immunizations Administered    Name Date Dose VIS Date Route   Pfizer COVID-19 Vaccine 10/25/2019  2:53 PM 0.3 mL 07/15/2019 Intramuscular   Manufacturer: [employer], [employer]   Lot: [id number]   NDC: [address]-5

## 2021-04-05 ENCOUNTER — Encounter (HOSPITAL_COMMUNITY): Payer: Self-pay

## 2021-04-05 ENCOUNTER — Other Ambulatory Visit: Payer: Self-pay

## 2021-04-05 ENCOUNTER — Inpatient Hospital Stay (HOSPITAL_COMMUNITY)
Admission: EM | Admit: 2021-04-05 | Discharge: 2021-04-07 | DRG: 065 | Disposition: A | Discharge status: Home / Self Care | Payer: BC Managed Care – PPO | Attending: Family Medicine | Admitting: Family Medicine

## 2021-04-05 ENCOUNTER — Emergency Department (HOSPITAL_COMMUNITY): Payer: BC Managed Care – PPO

## 2021-04-05 ENCOUNTER — Ambulatory Visit
Admission: EM | Admit: 2021-04-05 | Discharge: 2021-04-05 | Disposition: A | Discharge status: Other Acute Inpatient Hospital | Payer: BC Managed Care – PPO

## 2021-04-05 DIAGNOSIS — E1165 Type 2 diabetes mellitus with hyperglycemia: Secondary | ICD-10-CM | POA: Diagnosis present

## 2021-04-05 DIAGNOSIS — Z794 Long term (current) use of insulin: Secondary | ICD-10-CM

## 2021-04-05 DIAGNOSIS — R4781 Slurred speech: Secondary | ICD-10-CM | POA: Diagnosis present

## 2021-04-05 DIAGNOSIS — E119 Type 2 diabetes mellitus without complications: Secondary | ICD-10-CM

## 2021-04-05 DIAGNOSIS — I6381 Other cerebral infarction due to occlusion or stenosis of small artery: Principal | ICD-10-CM | POA: Diagnosis present

## 2021-04-05 DIAGNOSIS — I6389 Other cerebral infarction: Secondary | ICD-10-CM | POA: Diagnosis not present

## 2021-04-05 DIAGNOSIS — Z955 Presence of coronary angioplasty implant and graft: Secondary | ICD-10-CM | POA: Diagnosis not present

## 2021-04-05 DIAGNOSIS — Z20822 Contact with and (suspected) exposure to covid-19: Secondary | ICD-10-CM | POA: Diagnosis present

## 2021-04-05 DIAGNOSIS — Z6841 Body Mass Index (BMI) 40.0 and over, adult: Secondary | ICD-10-CM | POA: Diagnosis not present

## 2021-04-05 DIAGNOSIS — I2510 Atherosclerotic heart disease of native coronary artery without angina pectoris: Secondary | ICD-10-CM | POA: Diagnosis present

## 2021-04-05 DIAGNOSIS — E781 Pure hyperglyceridemia: Secondary | ICD-10-CM | POA: Diagnosis present

## 2021-04-05 DIAGNOSIS — G4700 Insomnia, unspecified: Secondary | ICD-10-CM | POA: Diagnosis present

## 2021-04-05 DIAGNOSIS — Z803 Family history of malignant neoplasm of breast: Secondary | ICD-10-CM | POA: Diagnosis not present

## 2021-04-05 DIAGNOSIS — I671 Cerebral aneurysm, nonruptured: Secondary | ICD-10-CM | POA: Diagnosis present

## 2021-04-05 DIAGNOSIS — I639 Cerebral infarction, unspecified: Secondary | ICD-10-CM | POA: Diagnosis not present

## 2021-04-05 DIAGNOSIS — I252 Old myocardial infarction: Secondary | ICD-10-CM

## 2021-04-05 DIAGNOSIS — I10 Essential (primary) hypertension: Secondary | ICD-10-CM | POA: Diagnosis present

## 2021-04-05 DIAGNOSIS — R4701 Aphasia: Secondary | ICD-10-CM | POA: Diagnosis not present

## 2021-04-05 DIAGNOSIS — I160 Hypertensive urgency: Secondary | ICD-10-CM

## 2021-04-05 DIAGNOSIS — Z833 Family history of diabetes mellitus: Secondary | ICD-10-CM | POA: Diagnosis not present

## 2021-04-05 DIAGNOSIS — E1159 Type 2 diabetes mellitus with other circulatory complications: Secondary | ICD-10-CM | POA: Diagnosis present

## 2021-04-05 DIAGNOSIS — Z79899 Other long term (current) drug therapy: Secondary | ICD-10-CM

## 2021-04-05 DIAGNOSIS — Z8249 Family history of ischemic heart disease and other diseases of the circulatory system: Secondary | ICD-10-CM

## 2021-04-05 DIAGNOSIS — Z91013 Allergy to seafood: Secondary | ICD-10-CM | POA: Diagnosis not present

## 2021-04-05 DIAGNOSIS — E785 Hyperlipidemia, unspecified: Secondary | ICD-10-CM

## 2021-04-05 DIAGNOSIS — R29700 NIHSS score 0: Secondary | ICD-10-CM | POA: Diagnosis present

## 2021-04-05 DIAGNOSIS — R42 Dizziness and giddiness: Secondary | ICD-10-CM

## 2021-04-05 DIAGNOSIS — Z7984 Long term (current) use of oral hypoglycemic drugs: Secondary | ICD-10-CM | POA: Diagnosis not present

## 2021-04-05 DIAGNOSIS — R531 Weakness: Secondary | ICD-10-CM

## 2021-04-05 LAB — URINALYSIS, ROUTINE W REFLEX MICROSCOPIC
Bilirubin Urine: NEGATIVE
Glucose, UA: NEGATIVE mg/dL
Hgb urine dipstick: NEGATIVE
Ketones, ur: NEGATIVE mg/dL
Leukocytes,Ua: NEGATIVE
Nitrite: NEGATIVE
Protein, ur: 100 mg/dL — AB
Specific Gravity, Urine: 1.023 (ref 1.005–1.030)
pH: 5 (ref 5.0–8.0)

## 2021-04-05 LAB — CBC
HCT: 36.3 % (ref 36.0–46.0)
Hemoglobin: 11.5 g/dL — ABNORMAL LOW (ref 12.0–15.0)
MCH: 29.4 pg (ref 26.0–34.0)
MCHC: 31.7 g/dL (ref 30.0–36.0)
MCV: 92.8 fL (ref 80.0–100.0)
Platelets: 384 10*3/uL (ref 150–400)
RBC: 3.91 MIL/uL (ref 3.87–5.11)
RDW: 12.4 % (ref 11.5–15.5)
WBC: 11 10*3/uL — ABNORMAL HIGH (ref 4.0–10.5)
nRBC: 0 % (ref 0.0–0.2)

## 2021-04-05 LAB — DIFFERENTIAL
Abs Immature Granulocytes: 0.03 10*3/uL (ref 0.00–0.07)
Basophils Absolute: 0 10*3/uL (ref 0.0–0.1)
Basophils Relative: 0 %
Eosinophils Absolute: 0.1 10*3/uL (ref 0.0–0.5)
Eosinophils Relative: 1 %
Immature Granulocytes: 0 %
Lymphocytes Relative: 27 %
Lymphs Abs: 3 10*3/uL (ref 0.7–4.0)
Monocytes Absolute: 0.7 10*3/uL (ref 0.1–1.0)
Monocytes Relative: 6 %
Neutro Abs: 7.2 10*3/uL (ref 1.7–7.7)
Neutrophils Relative %: 66 %

## 2021-04-05 LAB — I-STAT CHEM 8, ED
BUN: 25 mg/dL — ABNORMAL HIGH (ref 6–20)
Calcium, Ion: 1.2 mmol/L (ref 1.15–1.40)
Chloride: 106 mmol/L (ref 98–111)
Creatinine, Ser: 1 mg/dL (ref 0.44–1.00)
Glucose, Bld: 206 mg/dL — ABNORMAL HIGH (ref 70–99)
HCT: 36 % (ref 36.0–46.0)
Hemoglobin: 12.2 g/dL (ref 12.0–15.0)
Potassium: 4.7 mmol/L (ref 3.5–5.1)
Sodium: 136 mmol/L (ref 135–145)
TCO2: 23 mmol/L (ref 22–32)

## 2021-04-05 LAB — I-STAT BETA HCG BLOOD, ED (MC, WL, AP ONLY): I-stat hCG, quantitative: <5 m[IU]/mL (ref <5)

## 2021-04-05 LAB — RAPID URINE DRUG SCREEN, HOSP PERFORMED
Amphetamines: NOT DETECTED
Barbiturates: NOT DETECTED
Benzodiazepines: NOT DETECTED
Cocaine: NOT DETECTED
Opiates: NOT DETECTED
Tetrahydrocannabinol: NOT DETECTED

## 2021-04-05 LAB — COMPREHENSIVE METABOLIC PANEL
ALT: 11 U/L (ref 0–44)
AST: 17 U/L (ref 15–41)
Albumin: 3.7 g/dL (ref 3.5–5.0)
Alkaline Phosphatase: 60 U/L (ref 38–126)
Anion gap: 8 (ref 5–15)
BUN: 21 mg/dL — ABNORMAL HIGH (ref 6–20)
CO2: 22 mmol/L (ref 22–32)
Calcium: 9.6 mg/dL (ref 8.9–10.3)
Chloride: 105 mmol/L (ref 98–111)
Creatinine, Ser: 1.05 mg/dL — ABNORMAL HIGH (ref 0.44–1.00)
GFR, Estimated: >60 mL/min (ref >60)
Glucose, Bld: 210 mg/dL — ABNORMAL HIGH (ref 70–99)
Potassium: 4.7 mmol/L (ref 3.5–5.1)
Sodium: 135 mmol/L (ref 135–145)
Total Bilirubin: 0.7 mg/dL (ref 0.3–1.2)
Total Protein: 7.3 g/dL (ref 6.5–8.1)

## 2021-04-05 LAB — APTT: aPTT: 23 seconds — ABNORMAL LOW (ref 24–36)

## 2021-04-05 LAB — PROTIME-INR
INR: 1 (ref 0.8–1.2)
Prothrombin Time: 13.4 seconds (ref 11.4–15.2)

## 2021-04-05 LAB — ETHANOL: Alcohol, Ethyl (B): <10 mg/dL (ref <10)

## 2021-04-05 LAB — RESP PANEL BY RT-PCR (FLU A&B, COVID) ARPGX2
Influenza A by PCR: NEGATIVE
Influenza B by PCR: NEGATIVE
SARS Coronavirus 2 by RT PCR: NEGATIVE

## 2021-04-05 IMAGING — MR MR HEAD W/O CM
4 of 10 series · 22 of 48 positions shown · non-contrast
Comparison: None.

CLINICAL DATA: Acute neurologic deficit

EXAM:
MRI HEAD WITHOUT CONTRAST
TECHNIQUE: Multiplanar, multiecho pulse sequences of the brain and surrounding
structures were obtained without intravenous contrast.

[Series 2: FLAIR · sagittal · 5.0mm · 0.23mm/px · 3 of 25 slices shown (1 of 2)]
[im 1/25]
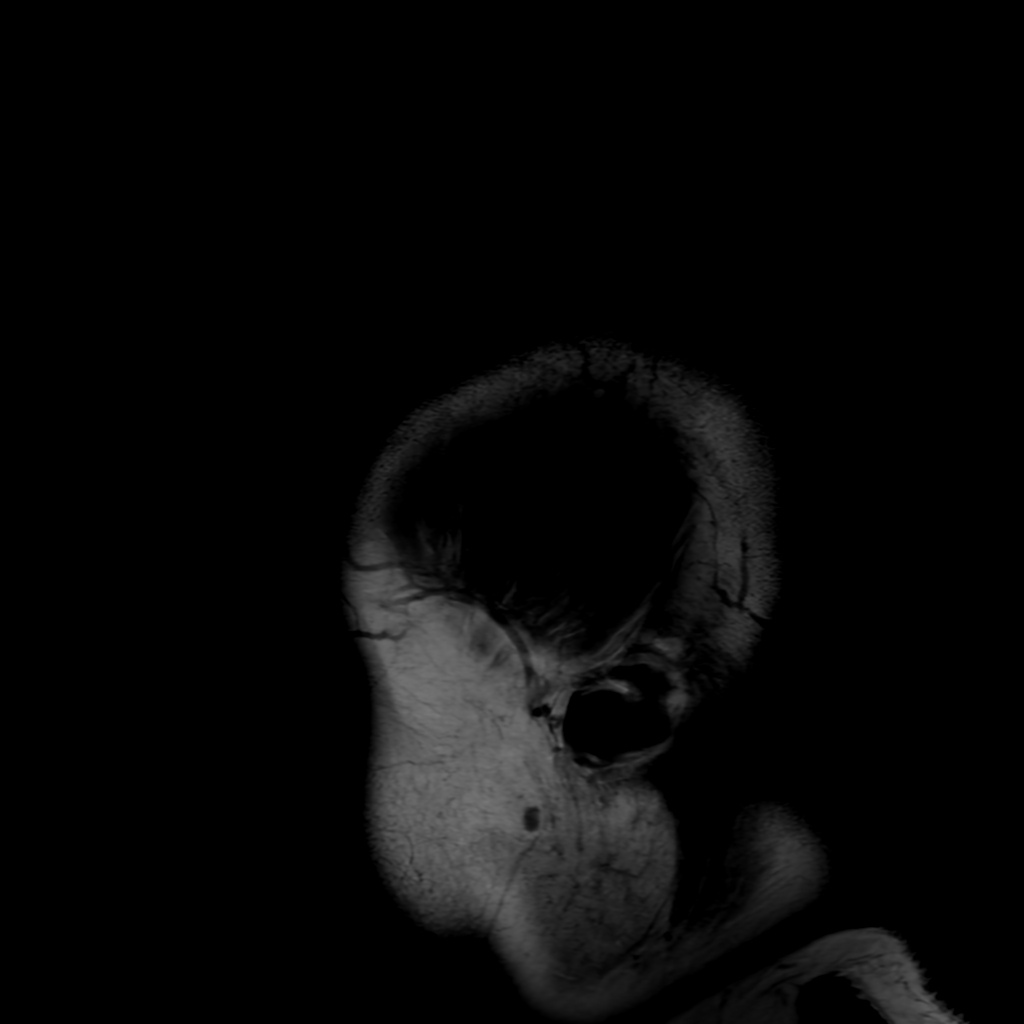
[im 13/25]
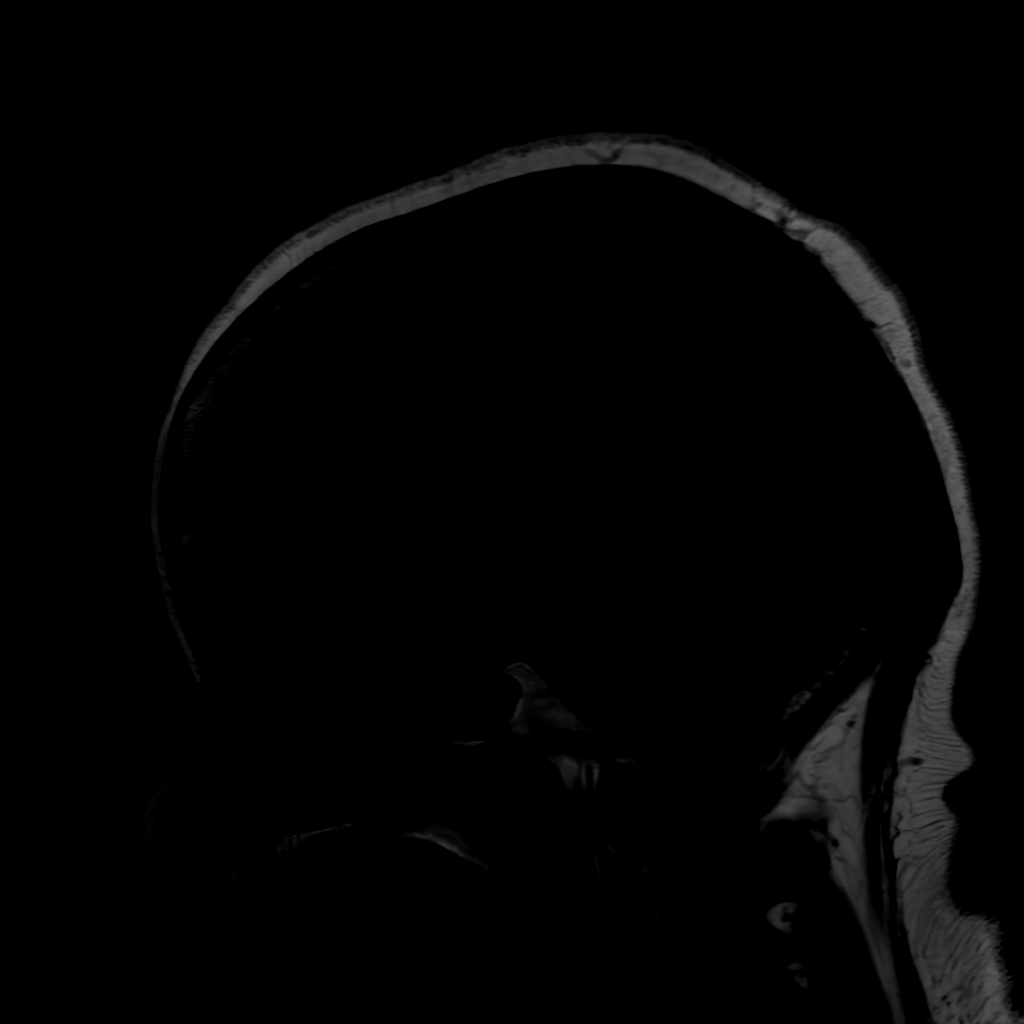
[im 25/25]
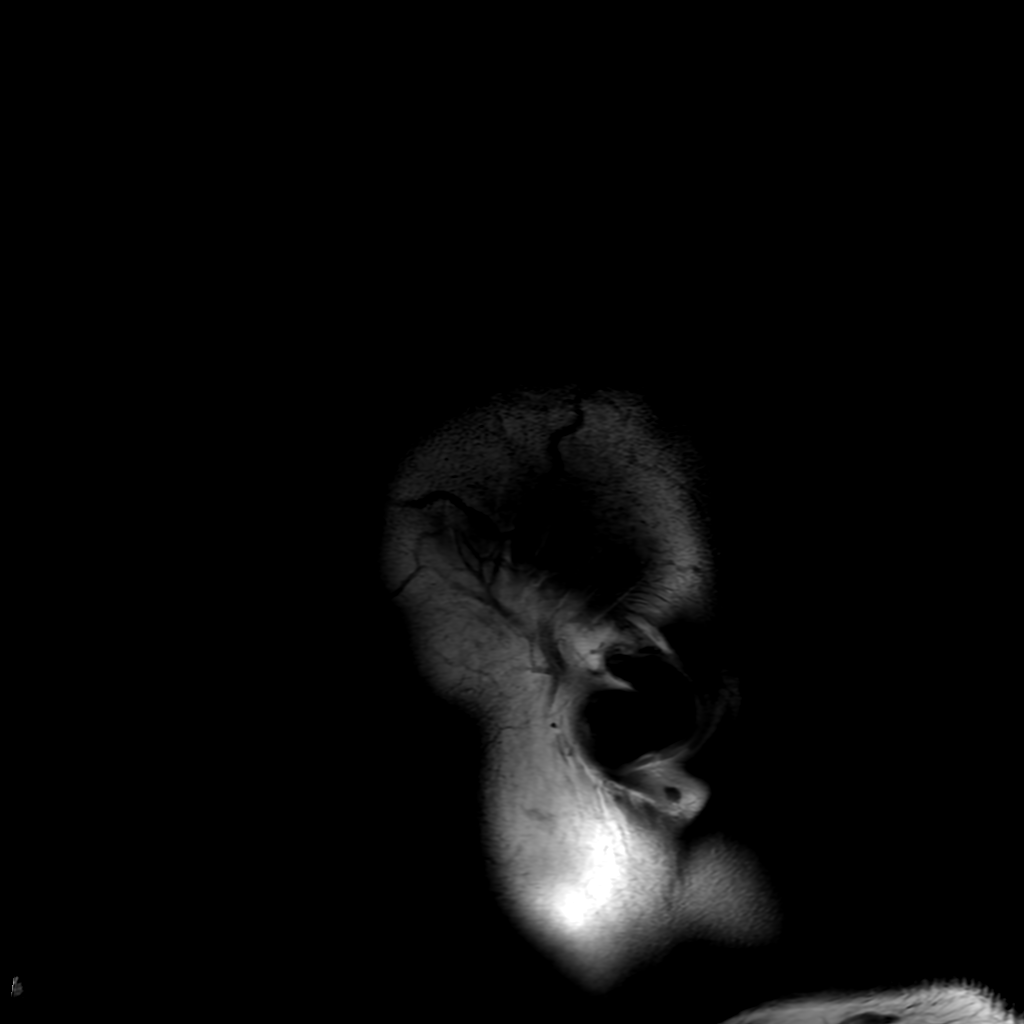

[Series 3: DWI · axial · 3.0mm · 0.94mm/px · z∈[-129,+18]mm · 9 of 100 slices shown (1 of 2)]
[im 1/100]
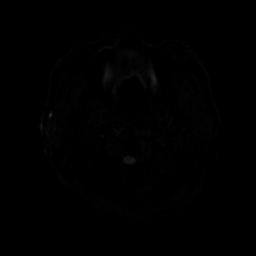
[im 13/100]
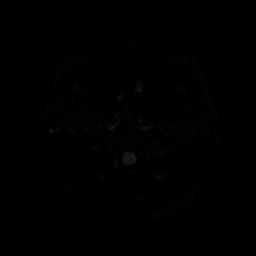
[im 25/100]
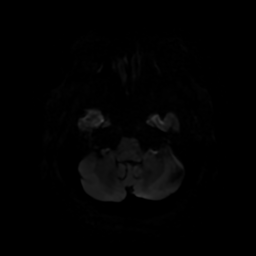
[im 38/100]
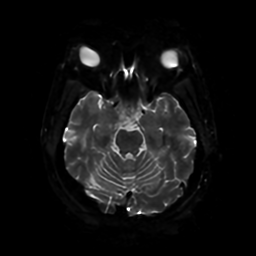
[im 50/100]
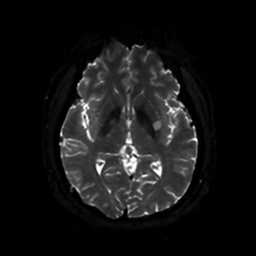
[im 62/100]
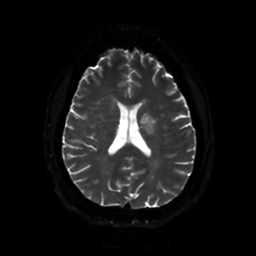
[im 75/100]
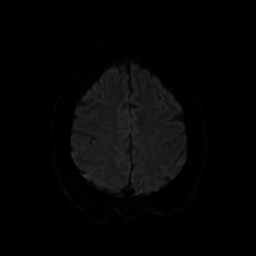
[im 87/100]
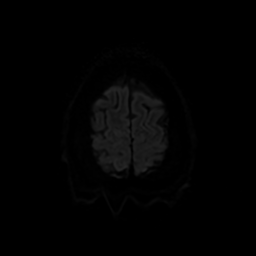
[im 100/100]
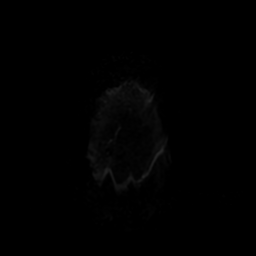

[Series 4: DWI · coronal · 4.0mm · 0.94mm/px · 6 of 74 slices shown (2 of 2)]
[im 1/74]
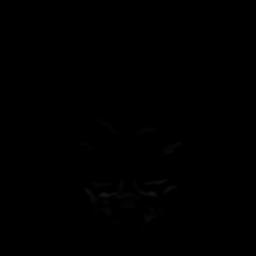
[im 15/74]
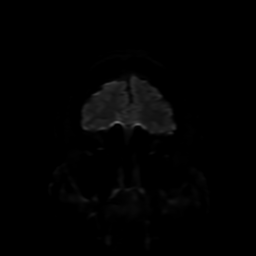
[im 30/74]
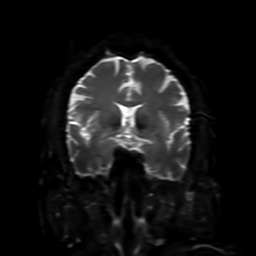
[im 44/74]
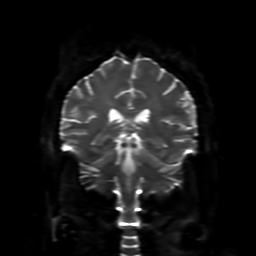
[im 59/74]
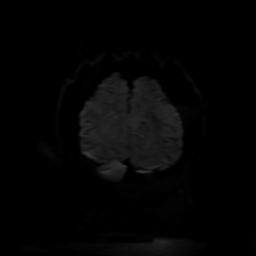
[im 74/74]
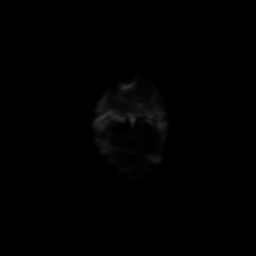

[Series 8: FLAIR · axial · 3.0mm · 0.47mm/px · z∈[-126,+16]mm · 4 of 50 slices shown (2 of 2)]
[im 1/50]
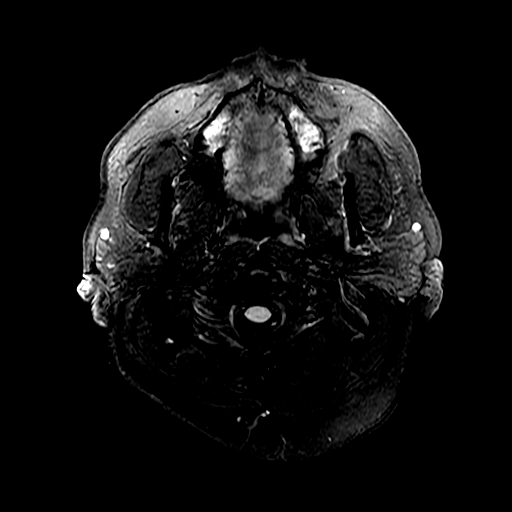
[im 17/50]
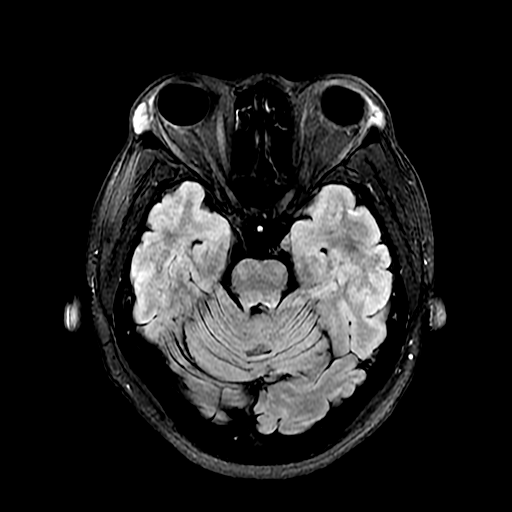
[im 33/50]
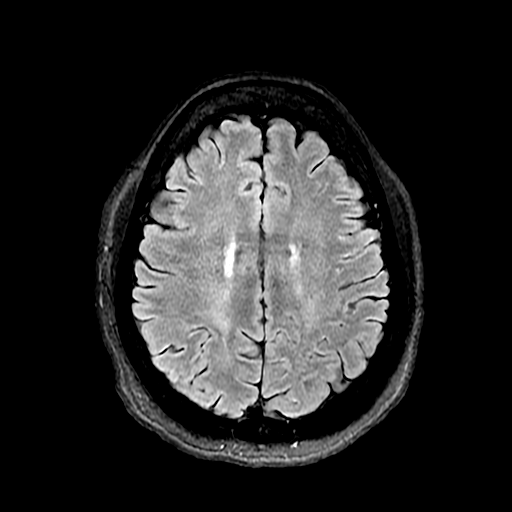
[im 50/50]
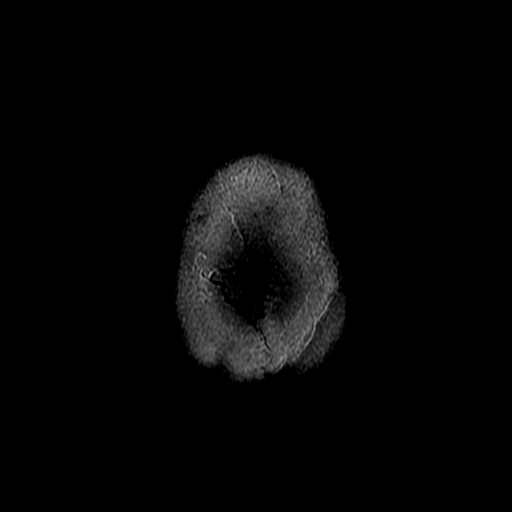

[22 of 48 positions shown; findings below may reference images not displayed]

FINDINGS: Brain: Intermediate sized area of acute ischemia in the left basal
ganglia. No acute or chronic hemorrhage. Normal white matter signal,
parenchymal volume and CSF spaces. The midline structures are
normal.

Vascular: Major flow voids are preserved.

Skull and upper cervical spine: Normal calvarium and skull base.
Visualized upper cervical spine and soft tissues are normal.

Sinuses/Orbits:No paranasal sinus fluid levels or advanced mucosal
thickening. No mastoid or middle ear effusion. Normal orbits.

Other: None.
IMPRESSION: Intermediate sized area of acute ischemia in the left basal ganglia.
No hemorrhage or mass effect.

## 2021-04-05 MED ORDER — SERTRALINE HCL 50 MG PO TABS
150.0000 mg | ORAL_TABLET | Freq: Every day | ORAL | Status: DC
  Administered 2021-04-06 – 2021-04-07 (×2): 150 mg via ORAL
  Filled 2021-04-05 (×2): qty 1

## 2021-04-05 MED ORDER — ASPIRIN 81 MG PO CHEW: 81.0000 mg | CHEWABLE_TABLET | Freq: Once | ORAL | Status: DC

## 2021-04-05 MED ORDER — ATORVASTATIN CALCIUM 80 MG PO TABS
80.0000 mg | ORAL_TABLET | Freq: Every day | ORAL | Status: DC
  Administered 2021-04-06: 80 mg via ORAL
  Filled 2021-04-05: qty 2

## 2021-04-05 MED ORDER — ACETAMINOPHEN 650 MG RE SUPP: 650.0000 mg | RECTAL | Status: DC | PRN

## 2021-04-05 MED ORDER — ENOXAPARIN SODIUM 40 MG/0.4ML IJ SOSY
40.0000 mg | PREFILLED_SYRINGE | INTRAMUSCULAR | Status: DC
  Administered 2021-04-06 – 2021-04-07 (×2): 40 mg via SUBCUTANEOUS
  Filled 2021-04-05 (×2): qty 0.4

## 2021-04-05 MED ORDER — INSULIN ASPART 100 UNIT/ML IJ SOLN
0.0000 [IU] | Freq: Every day | INTRAMUSCULAR | Status: DC
  Administered 2021-04-06: 2 [IU] via SUBCUTANEOUS

## 2021-04-05 MED ORDER — STROKE: EARLY STAGES OF RECOVERY BOOK
Freq: Once | Status: AC
Start: 2021-04-05 — End: 2021-04-06

## 2021-04-05 MED ORDER — ALPRAZOLAM 0.5 MG PO TABS: 1.0000 mg | ORAL_TABLET | Freq: Every day | ORAL | Status: DC | PRN

## 2021-04-05 MED ORDER — ADULT MULTIVITAMIN W/MINERALS CH
1.0000 | ORAL_TABLET | Freq: Every day | ORAL | Status: DC
  Administered 2021-04-06 – 2021-04-07 (×2): 1 via ORAL
  Filled 2021-04-05 (×2): qty 1

## 2021-04-05 MED ORDER — ACETAMINOPHEN 160 MG/5ML PO SOLN: 650.0000 mg | ORAL | Status: DC | PRN

## 2021-04-05 MED ORDER — ASPIRIN 81 MG PO CHEW
81.0000 mg | CHEWABLE_TABLET | Freq: Every day | ORAL | Status: DC
  Administered 2021-04-06 – 2021-04-07 (×2): 81 mg via ORAL
  Filled 2021-04-05 (×2): qty 1

## 2021-04-05 MED ORDER — INSULIN ASPART 100 UNIT/ML IJ SOLN
0.0000 [IU] | Freq: Three times a day (TID) | INTRAMUSCULAR | Status: DC
  Administered 2021-04-06 (×2): 11 [IU] via SUBCUTANEOUS

## 2021-04-05 MED ORDER — ACETAMINOPHEN 325 MG PO TABS: 650.0000 mg | ORAL_TABLET | ORAL | Status: DC | PRN

## 2021-04-05 MED ORDER — ZOLPIDEM TARTRATE 5 MG PO TABS: 10.0000 mg | ORAL_TABLET | Freq: Every evening | ORAL | Status: DC | PRN

## 2021-04-05 NOTE — H&P (Signed)
History and Physical    Barbara Jacobs ZOX:[id number] DOB: 02-19-1971 DOA: 04/05/2021  PCP: Verlon Au, MD  Patient coming from: Home  I have personally briefly reviewed patient's old medical records in [facility]  Chief Complaint: Aphasia  HPI: Barbara Jacobs is a 50 y.o. female with medical history significant of CAD, HTN, HLD, DM2.  Pt presents to ED with 48h history of difficulty with speech, described as difficulty with word finding.  Symptoms are persistent.  No focal numbness nor weakness.  Nothing makes symptoms better or worse.  No fevers, chills, CP, SOB.   ED Course: MRI confirms moderate sized acute ischemics troke in L basal ganglia.   Review of Systems: As per HPI, otherwise all review of systems negative.  Past Medical History:  Diagnosis Date   CAD (coronary artery disease) 05/04/2014   Coronary artery disease    a. inferior STEMI 03/2009 s/p BMS to prox PDA. b. NSTEMI 04/2014: mild diffuse nonobstructive CAD, continued patency of previously placed PDA, no clear culprit. Amlodipine and Plavix added.   H/O medication noncompliance    HTN (hypertension) 08/31/2012   Hyperlipidemia    Hypertension    Hypertriglyceridemia 02/13/2016   Morbid obesity (HCC)    Myocardial infarction (HCC)    Obesity, Class II, BMI 35-39.9, with comorbidity 02/13/2016   PMDD (premenstrual dysphoric disorder) 08/31/2012   Poorly controlled type 2 diabetes mellitus with circulatory disorder (HCC) 03/30/2015   Type 2 diabetes mellitus with vascular disease [facility])     Past Surgical History:  Procedure Laterality Date   CORONARY STENT PLACEMENT     LEFT HEART CATHETERIZATION WITH CORONARY ANGIOGRAM N/A 04/17/2014   Procedure: LEFT HEART CATHETERIZATION WITH CORONARY ANGIOGRAM;  Surgeon: Micheline Chapman, MD;  Location: [facility] CATH LAB;  Service: Cardiovascular;  Laterality: N/A;     reports that she has never smoked. She has never used smokeless tobacco. She reports  current alcohol use. She reports that she does not use drugs.  Allergies  Allergen Reactions   Ibuprofen Swelling    Throat swells shut   Naproxen     Swell up and throat swells shut   Pineapple    Shellfish Allergy Hives, Itching and Swelling    Family History  Problem Relation Age of Onset   Diabetes Mother    Hypertension Mother    Breast cancer Mother    Diabetes Father    COPD Father    Hypertension Father    Hypertension Sister    Kidney disease Sister    Diabetes Maternal Grandmother    Hypertension Maternal Grandmother    Heart disease Maternal Grandmother    Hypertension Paternal Grandmother    Diabetes Paternal Grandfather    Heart attack Neg Hx    Stroke Neg Hx      Prior to Admission medications   Medication Sig Start Date End Date Taking? Authorizing Provider  ALPRAZolam Prudy Feeler) 1 MG tablet Take 1 mg by mouth daily as needed for anxiety. 10/01/20  Yes [provider]  atorvastatin (LIPITOR) 80 MG tablet Take 1 tablet (80 mg total) by mouth daily at 6 PM. 07/18/15  Yes Sheliah Hatch, MD  carvedilol (COREG) 6.25 MG tablet Take 1 tablet (6.25 mg total) by mouth 2 (two) times daily with a meal. 10/26/15  Yes Sheliah Hatch, MD  glipiZIDE (GLUCOTROL) 5 MG tablet TAKE ONE TABLET BY MOUTH TWICE DAILY BEFORE A MEAL Patient taking differently: Take 5 mg by mouth 2 (two) times  daily before a meal. 05/02/16  Yes Carlus PavlovGherghe, Cristina, MD  lisinopril (PRINIVIL,ZESTRIL) 40 MG tablet Take 1 tablet (40 mg total) by mouth daily. 10/26/15  Yes Sheliah Hatchabori, Katherine E, MD  metFORMIN (GLUCOPHAGE) 1000 MG tablet TAKE ONE TABLET BY MOUTH TWICE DAILY WITH A MEAL Patient taking differently: Take 1,000 mg by mouth 2 (two) times daily with a meal. 05/02/16  Yes Carlus PavlovGherghe, Cristina, MD  Multiple Vitamins-Minerals (MULTIVITAMIN WITH MINERALS) tablet Take 1 tablet by mouth daily.   Yes [provider]  sertraline (ZOLOFT) 50 MG tablet Take 150 mg by mouth daily. 06/07/14  Yes  [provider]  TRULICITY 3 MG/0.5ML SOPN Inject 3 mg into the skin once a week. Mondays 03/06/21  Yes [provider]  zolpidem (AMBIEN) 10 MG tablet Take 1 tablet (10 mg total) by mouth at bedtime as needed. for sleep Patient taking differently: Take 10 mg by mouth at bedtime as needed for sleep. for sleep 05/23/16  Yes Sheliah Hatchabori, Katherine E, MD  Insulin Pen Needle (FIFTY50 PEN NEEDLES) 32G X 4 MM MISC Use 1x a day 03/30/15   Carlus PavlovGherghe, Cristina, MD    Physical Exam: Vitals:   04/05/21 1516 04/05/21 1815 04/05/21 2045 04/05/21 2300  BP:  (!) 150/120 (!) 164/87 (!) 167/99  Pulse:  74 79 79  Resp:  17 18 18   Temp:      TempSrc:      SpO2:  98% 98% 99%  Weight: 115.2 kg     Height: 5\' 6"  (1.676 m)       Constitutional: NAD, calm, comfortable Eyes: PERRL, lids and conjunctivae normal ENMT: Mucous membranes are moist. Posterior pharynx clear of any exudate or lesions.Normal dentition.  Neck: normal, supple, no masses, no thyromegaly Respiratory: clear to auscultation bilaterally, no wheezing, no crackles. Normal respiratory effort. No accessory muscle use.  Cardiovascular: Regular rate and rhythm, no murmurs / rubs / gallops. No extremity edema. 2+ pedal pulses. No carotid bruits.  Abdomen: no tenderness, no masses palpated. No hepatosplenomegaly. Bowel sounds positive.  Musculoskeletal: no clubbing / cyanosis. No joint deformity upper and lower extremities. Good ROM, no contractures. Normal muscle tone.  Skin: no rashes, lesions, ulcers. No induration Neurologic: Difficulty with word finding.  No focal weakness noted. Psychiatric: Normal judgment and insight. Alert and oriented x 3. Normal mood.    Labs on Admission: I have personally reviewed following labs and imaging studies  CBC: Recent Labs  Lab 04/05/21 1535 04/05/21 1623  WBC 11.0*  --   NEUTROABS 7.2  --   HGB 11.5* 12.2  HCT 36.3 36.0  MCV 92.8  --   PLT 384  --    Basic Metabolic Panel: Recent Labs   Lab 04/05/21 1535 04/05/21 1623  NA 135 136  K 4.7 4.7  CL 105 106  CO2 22  --   GLUCOSE 210* 206*  BUN 21* 25*  CREATININE 1.05* 1.00  CALCIUM 9.6  --    GFR: Estimated Creatinine Clearance: 86.8 mL/min (by C-G formula based on SCr of 1 mg/dL). Liver Function Tests: Recent Labs  Lab 04/05/21 1535  AST 17  ALT 11  ALKPHOS 60  BILITOT 0.7  PROT 7.3  ALBUMIN 3.7   No results for input(s): LIPASE, AMYLASE in the last 168 hours. No results for input(s): AMMONIA in the last 168 hours. Coagulation Profile: Recent Labs  Lab 04/05/21 1535  INR 1.0   Cardiac Enzymes: No results for input(s): CKTOTAL, CKMB, CKMBINDEX, TROPONINI in the last 168 hours.  BNP (last 3 results) No results for input(s): PROBNP in the last 8760 hours. HbA1C: No results for input(s): HGBA1C in the last 72 hours. CBG: No results for input(s): GLUCAP in the last 168 hours. Lipid Profile: No results for input(s): CHOL, HDL, LDLCALC, TRIG, CHOLHDL, LDLDIRECT in the last 72 hours. Thyroid Function Tests: No results for input(s): TSH, T4TOTAL, FREET4, T3FREE, THYROIDAB in the last 72 hours. Anemia Panel: No results for input(s): VITAMINB12, FOLATE, FERRITIN, TIBC, IRON, RETICCTPCT in the last 72 hours. Urine analysis:    Component Value Date/Time   COLORURINE YELLOW 04/05/2021 2109   APPEARANCEUR CLOUDY (A) 04/05/2021 2109   LABSPEC 1.023 04/05/2021 2109   PHURINE 5.0 04/05/2021 2109   GLUCOSEU NEGATIVE 04/05/2021 2109   HGBUR NEGATIVE 04/05/2021 2109   BILIRUBINUR NEGATIVE 04/05/2021 2109   KETONESUR NEGATIVE 04/05/2021 2109   PROTEINUR 100 (A) 04/05/2021 2109   NITRITE NEGATIVE 04/05/2021 2109   LEUKOCYTESUR NEGATIVE 04/05/2021 2109    Radiological Exams on Admission: MR BRAIN WO CONTRAST  Result Date: 04/05/2021 CLINICAL DATA:  Acute neurologic deficit EXAM: MRI HEAD WITHOUT CONTRAST TECHNIQUE: Multiplanar, multiecho pulse sequences of the brain and surrounding structures were obtained  without intravenous contrast. COMPARISON:  None. FINDINGS: Brain: Intermediate sized area of acute ischemia in the left basal ganglia. No acute or chronic hemorrhage. Normal white matter signal, parenchymal volume and CSF spaces. The midline structures are normal. Vascular: Major flow voids are preserved. Skull and upper cervical spine: Normal calvarium and skull base. Visualized upper cervical spine and soft tissues are normal. Sinuses/Orbits:No paranasal sinus fluid levels or advanced mucosal thickening. No mastoid or middle ear effusion. Normal orbits. Other: None. IMPRESSION: Intermediate sized area of acute ischemia in the left basal ganglia. No hemorrhage or mass effect. Electronically Signed   By: Deatra Robinson M.D.   On: 04/05/2021 22:17    EKG: Independently reviewed.  Assessment/Plan Principal Problem:   Acute ischemic stroke (HCC) Active Problems:   Hyperlipidemia   HTN (hypertension)   Poorly controlled type 2 diabetes mellitus with circulatory disorder (HCC)    Acute ischemic stroke - Stroke pathway Neuro consult Tele monitor 2d echo MRA head and neck ASA 81 for now Stop OCP (contraindicated with h/o stroke) FLP/A1C PT/OT/SLP DM2 - Hold home PO hypoglycemics Use mod scale SSI AC/HS (assuming pt passes swallow screen) HTN - Hold home BP meds and allow permissive HTN for acute stroke HLD - Cont statin FLP pending  DVT prophylaxis: Lovenox Code Status: Full Family Communication: No family in room Disposition Plan: Home after cleared by neurology Consults called: Neurology Admission status: Admit to inpatient  Severity of Illness: The appropriate patient status for this patient is INPATIENT. Inpatient status is judged to be reasonable and necessary in order to provide the required intensity of service to ensure the patient's safety. The patient's presenting symptoms, physical exam findings, and initial radiographic and laboratory data in the context of their chronic  comorbidities is felt to place them at high risk for further clinical deterioration. Furthermore, it is not anticipated that the patient will be medically stable for discharge from the hospital within 2 midnights of admission. The following factors support the patient status of inpatient.   IP status for acute ischemic stroke with MRI findings.  * I certify that at the point of admission it is my clinical judgment that the patient will require inpatient hospital care spanning beyond 2 midnights from the point of admission due to high intensity of service, high risk for further  deterioration and high frequency of surveillance required.*   Jeffey Janssen M. DO [facility]  How to contact the [facility] Attending or Consulting provider 7A - 7P or covering provider during after hours 7P -7A, for this patient?  Check the care team in [facility] and look for a) attending/consulting [facility] provider listed and b) the [facility] team listed Log into [email or web address]  Amion Physician Scheduling and messaging for groups and whole hospitals  On call and physician scheduling software for group practices, residents, hospitalists and other medical providers for call, clinic, rotation and shift schedules. OnCall [employer] is a hospital-wide system for scheduling doctors and paging doctors on call. EasyPlot is for scientific plotting and data analysis.  [email or web address]  and use [place]'s universal password to access. If you do not have the password, please contact the hospital operator.  Locate the [facility] provider you are looking for under [facility] and page to a number that you can be directly reached. If you still have difficulty reaching the provider, please page the [facility] (Director on Call) for the Hospitalists listed on amion for assistance.  04/05/2021, 11:34 PM

## 2021-04-05 NOTE — ED Triage Notes (Signed)
Pt c/o changes in speech, trouble forming words, difficulty with memorization, unbalanced coordination. Onset yesterday morning around 11a.  LUE slightly weaker than right, also uncoordinated movements. Sensation intact pupils PERRLA. Elevated BP.   Ambulance called.

## 2021-04-05 NOTE — ED Notes (Signed)
Neuro in to see

## 2021-04-05 NOTE — ED Notes (Signed)
To mri 

## 2021-04-05 NOTE — ED Notes (Signed)
Got patient undressed into a gown on the monitor did ekg shown to er provider patient is resting with call bell in reach  

## 2021-04-05 NOTE — ED Notes (Signed)
Pt up to the br  No assistance needed 

## 2021-04-05 NOTE — ED Notes (Signed)
Pt concerned about the wait for mri

## 2021-04-05 NOTE — ED Provider Notes (Addendum)
[facility]   MRN: [id number] DOB: Apr 21, 1971  Subjective:   Barbara Jacobs is a 50 y.o. female presenting for 1 day history of acute onset slurred speech, dizziness, unsteadiness that started yesterday at 11am when she was at work. Has had some confusion as well. Denies headache, chest pain, vision changes, numbness or tingling, weakness, shob, heart racing. She has a history of diabetes treated without insulin, has a hx of HTN. She has a history of MI, last episode was 2010. States that she was taken off of Plavix years ago when she started going to her new cardiologist.   No current facility-administered medications for this encounter.  Current Outpatient Medications:    aspirin EC 81 MG tablet, Take 1 tablet (81 mg total) by mouth daily., Disp: , Rfl:    atorvastatin (LIPITOR) 80 MG tablet, Take 1 tablet (80 mg total) by mouth daily at 6 PM., Disp: 30 tablet, Rfl: 5   carvedilol (COREG) 6.25 MG tablet, Take 1 tablet (6.25 mg total) by mouth 2 (two) times daily with a meal., Disp: 60 tablet, Rfl: 6   clopidogrel (PLAVIX) 75 MG tablet, Take 1 tablet (75 mg total) by mouth daily., Disp: 30 tablet, Rfl: 11   ERRIN 0.35 MG tablet, , Disp: , Rfl:    famotidine (PEPCID) 20 MG tablet, Take 1 tablet (20 mg total) by mouth 2 (two) times daily., Disp: 60 tablet, Rfl: 5   fenofibrate 54 MG tablet, Take 1 tablet (54 mg total) by mouth daily., Disp: 30 tablet, Rfl: 5   glipiZIDE (GLUCOTROL) 5 MG tablet, TAKE ONE TABLET BY MOUTH TWICE DAILY BEFORE A MEAL, Disp: 180 tablet, Rfl: 1   Insulin Pen Needle (FIFTY50 PEN NEEDLES) 32G X 4 MM MISC, Use 1x a day, Disp: 100 each, Rfl: 1   Liraglutide (VICTOZA) 18 MG/3ML SOPN, Inject 0.3 mLs (1.8 mg total) into the skin every morning., Disp: 3 pen, Rfl: 2   lisinopril (PRINIVIL,ZESTRIL) 40 MG tablet, Take 1 tablet (40 mg total) by mouth daily., Disp: 30 tablet, Rfl: 6   metFORMIN (GLUCOPHAGE) 1000 MG tablet, TAKE ONE TABLET BY MOUTH TWICE DAILY  WITH A MEAL, Disp: 180 tablet, Rfl: 1   Multiple Vitamins-Minerals (MULTIVITAMIN WITH MINERALS) tablet, Take 1 tablet by mouth daily., Disp: , Rfl:    omega-3 acid ethyl esters (LOVAZA) 1 G capsule, Take 1 capsule (1 g total) by mouth daily., Disp: 30 capsule, Rfl: 5   sertraline (ZOLOFT) 50 MG tablet, Take 50 mg by mouth daily. , Disp: , Rfl:    zolpidem (AMBIEN) 10 MG tablet, Take 1 tablet (10 mg total) by mouth at bedtime as needed. for sleep, Disp: 30 tablet, Rfl: 1   Allergies  Allergen Reactions   Ibuprofen Swelling    Throat swells shut   Naproxen     Swell up and throat swells shut   Pineapple    Shellfish Allergy Hives, Itching and Swelling    Past Medical History:  Diagnosis Date   CAD (coronary artery disease) 05/04/2014   Coronary artery disease    a. inferior STEMI 03/2009 s/p BMS to prox PDA. b. NSTEMI 04/2014: mild diffuse nonobstructive CAD, continued patency of previously placed PDA, no clear culprit. Amlodipine and Plavix added.   H/O medication noncompliance    HTN (hypertension) 08/31/2012   Hyperlipidemia    Hypertension    Hypertriglyceridemia 02/13/2016   Morbid obesity (HCC)    Myocardial infarction (HCC)    Obesity, Class II, BMI 35-39.9, with comorbidity  02/13/2016   PMDD (premenstrual dysphoric disorder) 08/31/2012   Poorly controlled type 2 diabetes mellitus with circulatory disorder (HCC) 03/30/2015   Type 2 diabetes mellitus with vascular disease [facility])      Past Surgical History:  Procedure Laterality Date   CORONARY STENT PLACEMENT     LEFT HEART CATHETERIZATION WITH CORONARY ANGIOGRAM N/A 04/17/2014   Procedure: LEFT HEART CATHETERIZATION WITH CORONARY ANGIOGRAM;  Surgeon: Micheline Chapman, MD;  Location: [facility] CATH LAB;  Service: Cardiovascular;  Laterality: N/A;    Family History  Problem Relation Age of Onset   Diabetes Mother    Hypertension Mother    Breast cancer Mother    Diabetes Father    COPD Father    Hypertension Father    Hypertension  Sister    Kidney disease Sister    Diabetes Maternal Grandmother    Hypertension Maternal Grandmother    Heart disease Maternal Grandmother    Hypertension Paternal Grandmother    Diabetes Paternal Grandfather    Heart attack Neg Hx    Stroke Neg Hx     Social History   Tobacco Use   Smoking status: Never   Smokeless tobacco: Never  Vaping Use   Vaping Use: Never used  Substance Use Topics   Alcohol use: Yes    Comment: pt drinks twice a month   Drug use: No    ROS   Objective:   Vitals: BP (!) 201/99 (BP Location: Left Arm)   Pulse 87   Temp 98.1 F (36.7 C)   Resp 18   SpO2 99%   Physical Exam Constitutional:      General: She is not in acute distress.    Appearance: Normal appearance. She is well-developed. She is obese. She is not ill-appearing, toxic-appearing or diaphoretic.  HENT:     Head: Normocephalic and atraumatic.     Right Ear: External ear normal.     Left Ear: External ear normal.     Nose: Nose normal.     Mouth/Throat:     Mouth: Mucous membranes are moist.     Pharynx: Oropharynx is clear.  Eyes:     General: No scleral icterus.    Extraocular Movements: Extraocular movements intact.     Pupils: Pupils are equal, round, and reactive to light.  Cardiovascular:     Rate and Rhythm: Normal rate and regular rhythm.     Pulses: Normal pulses.     Heart sounds: Normal heart sounds. No murmur heard.   No friction rub. No gallop.  Pulmonary:     Effort: Pulmonary effort is normal. No respiratory distress.     Breath sounds: Normal breath sounds. No stridor. No wheezing, rhonchi or rales.  Skin:    General: Skin is warm and dry.     Findings: No rash.  Neurological:     General: No focal deficit present.     Mental Status: She is alert and oriented to person, place, and time.     Cranial Nerves: No cranial nerve deficit.     Motor: Weakness (left arm) present.     Comments: Right-sided pronator drift.  No facial asymmetry.  Psychiatric:         Mood and Affect: Mood normal.        Behavior: Behavior normal.        Thought Content: Thought content normal.        Judgment: Judgment normal.    Assessment and Plan :   PDMP not reviewed  this encounter.  1. Weakness   2. Hypertensive urgency   3. Slurred speech   4. Dizziness   5. Type 2 diabetes mellitus treated without insulin (HCC)   6. History of MI (myocardial infarction)     Patient is in need of a higher level of care than we can provide in the urgent care setting for rule out of an acute encephalopathy such as stroke. Code stroke was not called due to timing of her symptoms. Patient sent out by EMS.      Wallis Bamberg, [place] 04/05/21 8127

## 2021-04-05 NOTE — ED Provider Notes (Signed)
[facility]   CSN: [id number] Arrival date & time: 04/05/21  1503     History Chief Complaint  Patient presents with   Aphasia    Barbara Jacobs is a 50 y.o. female.  HPI Patient is a 50 year old female with past medical history detailed below notably significant for CAD with prior MI, HTN, HLD, morbid obesity, DM2  She is presented to the ER today with greater than 48 hours of occultly with word finding.  Patient states that she is a Engineer, siteschool teacher she woke up at 8/31 and went to work and noticed that when she was attempting to teach she was having difficulty finding words.  She denies any numbness or weakness.  She went to sleep with the symptoms and woke up yesterday with continued symptoms.  She states because of these ongoing symptoms she went to an urgent care for evaluation at that time she was having some trouble remembering what her symptoms started and told the provider that her symptoms began 1 day ago  She was then sent to the ER for evaluation  She states at no time has she had any numbness or weakness in extremities she states that she has had some blurry vision that comes and goes but states no blurry vision currently.  No chest pain shortness of breath lightheadedness or dizziness.     Past Medical History:  Diagnosis Date   CAD (coronary artery disease) 05/04/2014   Coronary artery disease    a. inferior STEMI 03/2009 s/p BMS to prox PDA. b. NSTEMI 04/2014: mild diffuse nonobstructive CAD, continued patency of previously placed PDA, no clear culprit. Amlodipine and Plavix added.   H/O medication noncompliance    HTN (hypertension) 08/31/2012   Hyperlipidemia    Hypertension    Hypertriglyceridemia 02/13/2016   Morbid obesity (HCC)    Myocardial infarction (HCC)    Obesity, Class II, BMI 35-39.9, with comorbidity 02/13/2016   PMDD (premenstrual dysphoric disorder) 08/31/2012   Poorly controlled type 2 diabetes  mellitus with circulatory disorder (HCC) 03/30/2015   Type 2 diabetes mellitus with vascular disease [facility](HCC)     Patient Active Problem List   Diagnosis Date Noted   Acute ischemic stroke (HCC) 04/05/2021   Obesity, Class II, BMI 35-39.9, with comorbidity 02/13/2016   Hypertriglyceridemia 02/13/2016   Poorly controlled type 2 diabetes mellitus with circulatory disorder (HCC) 03/30/2015   CAD (coronary artery disease) 05/04/2014   Hyperlipidemia 08/31/2012   HTN (hypertension) 08/31/2012   PMDD (premenstrual dysphoric disorder) 08/31/2012    Past Surgical History:  Procedure Laterality Date   CORONARY STENT PLACEMENT     LEFT HEART CATHETERIZATION WITH CORONARY ANGIOGRAM N/A 04/17/2014   Procedure: LEFT HEART CATHETERIZATION WITH CORONARY ANGIOGRAM;  Surgeon: Micheline ChapmanMichael D Cooper, MD;  [facility];  Service: Cardiovascular;  Laterality: N/A;     OB History   No obstetric history on file.     Family History  Problem Relation Age of Onset   Diabetes Mother    Hypertension Mother    Breast cancer Mother    Diabetes Father    COPD Father    Hypertension Father    Hypertension Sister    Kidney disease Sister    Diabetes Maternal Grandmother    Hypertension Maternal Grandmother    Heart disease Maternal Grandmother    Hypertension Paternal Grandmother    Diabetes Paternal Grandfather    Heart attack Neg Hx    Stroke Neg Hx  Social History   Tobacco Use   Smoking status: Never   Smokeless tobacco: Never  Vaping Use   Vaping Use: Never used  Substance Use Topics   Alcohol use: Yes    Comment: pt drinks twice a month   Drug use: No    Home Medications Prior to Admission medications   Medication Sig Start Date End Date Taking? Authorizing Provider  ALPRAZolam Prudy Feeler) 1 MG tablet Take 1 mg by mouth daily as needed for anxiety. 10/01/20  Yes [provider]  atorvastatin (LIPITOR) 80 MG tablet Take 1 tablet (80 mg total) by mouth daily at 6 PM. 07/18/15   Yes Sheliah Hatch, MD  carvedilol (COREG) 6.25 MG tablet Take 1 tablet (6.25 mg total) by mouth 2 (two) times daily with a meal. 10/26/15  Yes Sheliah Hatch, MD  glipiZIDE (GLUCOTROL) 5 MG tablet TAKE ONE TABLET BY MOUTH TWICE DAILY BEFORE A MEAL Patient taking differently: Take 5 mg by mouth 2 (two) times daily before a meal. 05/02/16  Yes Carlus Pavlov, MD  lisinopril (PRINIVIL,ZESTRIL) 40 MG tablet Take 1 tablet (40 mg total) by mouth daily. 10/26/15  Yes Sheliah Hatch, MD  metFORMIN (GLUCOPHAGE) 1000 MG tablet TAKE ONE TABLET BY MOUTH TWICE DAILY WITH A MEAL Patient taking differently: Take 1,000 mg by mouth 2 (two) times daily with a meal. 05/02/16  Yes Carlus Pavlov, MD  Multiple Vitamins-Minerals (MULTIVITAMIN WITH MINERALS) tablet Take 1 tablet by mouth daily.   Yes [provider]  sertraline (ZOLOFT) 50 MG tablet Take 150 mg by mouth daily. 06/07/14  Yes [provider]  TRULICITY 3 MG/0.5ML SOPN Inject 3 mg into the skin once a week. Mondays 03/06/21  Yes [provider]  zolpidem (AMBIEN) 10 MG tablet Take 1 tablet (10 mg total) by mouth at bedtime as needed. for sleep Patient taking differently: Take 10 mg by mouth at bedtime as needed for sleep. for sleep 05/23/16  Yes Sheliah Hatch, MD  Insulin Pen Needle (FIFTY50 PEN NEEDLES) 32G X 4 MM MISC Use 1x a day 03/30/15   Carlus Pavlov, MD    Allergies    Ibuprofen, Naproxen, Pineapple, and Shellfish allergy  Review of Systems   Review of Systems  Constitutional:  Negative for chills and fever.  HENT:  Negative for congestion.   Eyes:  Negative for pain.  Respiratory:  Negative for cough and shortness of breath.   Cardiovascular:  Negative for chest pain and leg swelling.  Gastrointestinal:  Negative for abdominal pain, nausea and vomiting.  Genitourinary:  Negative for dysuria.  Musculoskeletal:  Negative for myalgias.  Skin:  Negative for rash.  Neurological:  Negative  for dizziness and headaches.       Aphasia   Physical Exam Updated Vital Signs BP (!) 154/92   Pulse 77   Temp 98.2 F (36.8 C) (Oral)   Resp 18   Ht  (1.676 m)   Wt 115.2 kg   SpO2 98%   BMI 41.00 kg/m   Physical Exam Vitals and nursing note reviewed.  Constitutional:      General: She is not in acute distress.    Appearance: She is obese.     Comments: Pleasant 50 year old female  HENT:     Head: Normocephalic and atraumatic.     Nose: Nose normal.     Mouth/Throat:     Mouth: Mucous membranes are moist.  Eyes:     General: No scleral icterus. Cardiovascular:  Rate and Rhythm: Normal rate and regular rhythm.     Pulses: Normal pulses.     Heart sounds: Normal heart sounds.  Pulmonary:     Effort: Pulmonary effort is normal. No respiratory distress.     Breath sounds: No wheezing.  Abdominal:     Palpations: Abdomen is soft.     Tenderness: There is no abdominal tenderness. There is no guarding or rebound.  Musculoskeletal:     Cervical back: Normal range of motion.     Right lower leg: No edema.     Left lower leg: No edema.  Skin:    General: Skin is warm and dry.     Capillary Refill: Capillary refill takes less than 2 seconds.  Neurological:     Mental Status: She is alert. Mental status is at baseline.     Comments: Alert and oriented to self, place, time and event.   Difficulty with word finding. Slight slurring of speech. Able to communicate but with pauses.   Strength 4/5 in upper/lower extremities (symmetrically weak) Sensation intact in upper/lower extremities   Negative Romberg. No pronator drift.  Normal finger-to-nose and feet tapping.  CN I not tested  CN II grossly intact visual fields bilaterally. Did not visualize posterior eye.  CN III, IV, VI PERRLA and EOMs intact bilaterally  CN V Intact sensation to sharp and light touch to the face  CN VII facial movements symmetric  CN VIII not tested  CN IX, X no uvula deviation,  symmetric rise of soft palate  CN XI 5/5 SCM and trapezius strength bilaterally  CN XII Midline tongue protrusion, symmetric L/R movements   Psychiatric:        Mood and Affect: Mood normal.        Behavior: Behavior normal.    ED Results / Procedures / Treatments   Labs (all labs ordered are listed, but only abnormal results are displayed) Labs Reviewed  APTT - Abnormal; Notable for the following components:      Result Value   aPTT 23 (*)    All other components within normal limits  CBC - Abnormal; Notable for the following components:   WBC 11.0 (*)    Hemoglobin 11.5 (*)    All other components within normal limits  COMPREHENSIVE METABOLIC PANEL - Abnormal; Notable for the following components:   Glucose, Bld 210 (*)    BUN 21 (*)    Creatinine, Ser 1.05 (*)    All other components within normal limits  URINALYSIS, ROUTINE W REFLEX MICROSCOPIC - Abnormal; Notable for the following components:   APPearance CLOUDY (*)    Protein, ur 100 (*)    Bacteria, UA RARE (*)    All other components within normal limits  I-STAT CHEM 8, ED - Abnormal; Notable for the following components:   BUN 25 (*)    Glucose, Bld 206 (*)    All other components within normal limits  CBG MONITORING, ED - Abnormal; Notable for the following components:   Glucose-Capillary 197 (*)    All other components within normal limits  RESP PANEL BY RT-PCR (FLU A&B, COVID) ARPGX2  ETHANOL  PROTIME-INR  DIFFERENTIAL  RAPID URINE DRUG SCREEN, HOSP PERFORMED  HIV ANTIBODY (ROUTINE TESTING W REFLEX)  HEMOGLOBIN A1C  LIPID PANEL  HEMOGLOBIN A1C  PREGNANCY, URINE  I-STAT BETA HCG BLOOD, ED ([facility], [facility], [facility] ONLY)    EKG None  Radiology MR BRAIN WO CONTRAST  Result Date: 04/05/2021 CLINICAL DATA:  Acute neurologic  deficit EXAM: MRI HEAD WITHOUT CONTRAST TECHNIQUE: Multiplanar, multiecho pulse sequences of the brain and surrounding structures were obtained without intravenous contrast. COMPARISON:  None.  FINDINGS: Brain: Intermediate sized area of acute ischemia in the left basal ganglia. No acute or chronic hemorrhage. Normal white matter signal, parenchymal volume and CSF spaces. The midline structures are normal. Vascular: Major flow voids are preserved. Skull and upper cervical spine: Normal calvarium and skull base. Visualized upper cervical spine and soft tissues are normal. Sinuses/Orbits:No paranasal sinus fluid levels or advanced mucosal thickening. No mastoid or middle ear effusion. Normal orbits. Other: None. IMPRESSION: Intermediate sized area of acute ischemia in the left basal ganglia. No hemorrhage or mass effect. Electronically Signed   By: Deatra Robinson M.D.   On: 04/05/2021 22:17    Procedures .Critical Care  Date/Time: 04/06/2021 12:53 AM Performed by: Gailen Shelter, PA Authorized by: Gailen Shelter, PA   Critical care provider statement:    Critical care time (minutes):  35   Critical care time was exclusive of:  Separately billable procedures and treating other patients and teaching time   Critical care was necessary to treat or prevent imminent or life-threatening deterioration of the following conditions: Stroke.   Critical care was time spent personally by me on the following activities:  Discussions with consultants, evaluation of patient's response to treatment, examination of patient, review of old charts, re-evaluation of patient's condition, pulse oximetry, ordering and review of radiographic studies, ordering and review of laboratory studies and ordering and performing treatments and interventions   I assumed direction of critical care for this patient from another provider in my specialty: no     Medications Ordered in ED Medications  aspirin chewable tablet 81 mg (has no administration in time range)  atorvastatin (LIPITOR) tablet 80 mg (has no administration in time range)  ALPRAZolam (XANAX) tablet 1 mg (has no administration in time range)  sertraline  (ZOLOFT) tablet 150 mg (has no administration in time range)  zolpidem (AMBIEN) tablet 10 mg (has no administration in time range)  multivitamin with minerals tablet 1 tablet (has no administration in time range)   stroke: mapping our early stages of recovery book (has no administration in time range)  acetaminophen (TYLENOL) tablet 650 mg (has no administration in time range)    Or  acetaminophen (TYLENOL) 160 MG/5ML solution 650 mg (has no administration in time range)    Or  acetaminophen (TYLENOL) suppository 650 mg (has no administration in time range)  enoxaparin (LOVENOX) injection 40 mg (has no administration in time range)  insulin aspart (novoLOG) injection 0-15 Units (has no administration in time range)  insulin aspart (novoLOG) injection 0-5 Units (has no administration in time range)  clopidogrel (PLAVIX) tablet 75 mg (has no administration in time range)    ED Course  I have reviewed the triage vital signs and the nursing notes.  Pertinent labs & imaging results that were available during my care of the patient were reviewed by me and considered in my medical decision making (see chart for details).  Clinical Course as of 04/06/21 0052  Fri Apr 05, 2021  1718 CMP unremarkable apart from mild hyperglycemia.  Creatinine at baseline.  CBC without significant leukocytosis.  Very mild unremarkable anemia.  Differential unremarkable.  I-STAT hCG negative for pregnancy.  Coags approximately within normal limits.  COVID and influenza pending at this time along with urine studies. [WF]  1913 Discussed with MRI who states that there are 2 patients ahead  of [place] for MRI. [WF]  2233 Discussed with Sal of neurology. Hospitalist admit. Pt aware of stroke. Given 31 asa - scheduled for daily.  [WF]  2300 Discussed with Laban Emperor of hospitalist who will admit.  [WF]    Clinical Course User Index [WF] Gailen Shelter, PA   MDM Rules/Calculators/A&P                           Patient  admitted to hospital for stroke.  Her symptoms have been going on for over 48 hours.  For from some physical deconditioning she is without any significant weakness.  No unilateral weakness.  Labs and imaging reviewed.  Neurology will evaluate.  Hospitalist will admit.  Final Clinical Impression(s) / ED Diagnoses Final diagnoses:  Cerebrovascular accident (CVA), unspecified mechanism [facility])    Rx / DC Orders ED Discharge Orders     None        Gailen Shelter, [place] 04/06/21 0054    Linwood Dibbles, MD 04/06/21 [phone]

## 2021-04-05 NOTE — ED Notes (Signed)
No complaints

## 2021-04-05 NOTE — ED Triage Notes (Signed)
Pt comes from urgent care via EMS. Pt reports trouble "getting word out" onset 2 days ago on Wednesday. Pt also reports sob with exertion in same time frame. EMS reports pt able to speak and form words with meaning however slight hesitation and "difficulty finding her words." Pt is aware that she is having difficulty and appears frustrated by it per EMS. Pt hx of MI with formally prescribed Plavix but is no longer on Plavix. Pt denies any trauma, falls or recent head injury. Pt a/o x4. BP 200/130 HR 88 RR 20 O2 99% RA CBG 226

## 2021-04-05 NOTE — ED Notes (Signed)
The pt passed her swallow screen 

## 2021-04-06 ENCOUNTER — Inpatient Hospital Stay (HOSPITAL_COMMUNITY): Payer: BC Managed Care – PPO

## 2021-04-06 ENCOUNTER — Other Ambulatory Visit (HOSPITAL_COMMUNITY): Payer: BC Managed Care – PPO

## 2021-04-06 DIAGNOSIS — I639 Cerebral infarction, unspecified: Secondary | ICD-10-CM

## 2021-04-06 LAB — LIPID PANEL
Cholesterol: 179 mg/dL (ref 0–200)
HDL: 39 mg/dL — ABNORMAL LOW (ref >40)
LDL Cholesterol: 78 mg/dL (ref 0–99)
Total CHOL/HDL Ratio: 4.6 RATIO
Triglycerides: 308 mg/dL — ABNORMAL HIGH (ref <150)
VLDL: 62 mg/dL — ABNORMAL HIGH (ref 0–40)

## 2021-04-06 LAB — CBG MONITORING, ED: Glucose-Capillary: 197 mg/dL — ABNORMAL HIGH (ref 70–99)

## 2021-04-06 LAB — HEMOGLOBIN A1C
Hgb A1c MFr Bld: 8.6 % — ABNORMAL HIGH (ref 4.8–5.6)
Mean Plasma Glucose: 200.12 mg/dL

## 2021-04-06 LAB — HIV ANTIBODY (ROUTINE TESTING W REFLEX): HIV Screen 4th Generation wRfx: NONREACTIVE

## 2021-04-06 LAB — GLUCOSE, CAPILLARY
Glucose-Capillary: 337 mg/dL — ABNORMAL HIGH (ref 70–99)
Glucose-Capillary: 328 mg/dL — ABNORMAL HIGH (ref 70–99)
Glucose-Capillary: 247 mg/dL — ABNORMAL HIGH (ref 70–99)

## 2021-04-06 IMAGING — MR MR MRA NECK WO/W CM
4 of 7 series · 19 of 48 positions shown · IV contrast (Yes GAD)
Comparison: None.

CLINICAL DATA: Stroke follow-up

EXAM:
MRA NECK WITHOUT AND WITH CONTRAST
TECHNIQUE: Multiplanar and multiecho pulse sequences of the neck were obtained
without and with intravenous contrast. Angiographic images of the
neck were obtained using MRA technique without and with intravenous
contrast.
CONTRAST:  10mL GADAVIST GADOBUTROL 1 MMOL/ML IV SOLN

[Series 400: cor cemra ft · coronal · 1.2mm · 0.59mm/px · 7 of 109 slices shown]
[im 1/109]
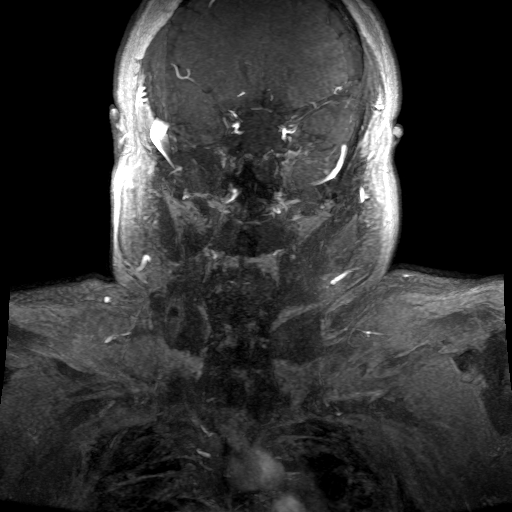
[im 19/109]
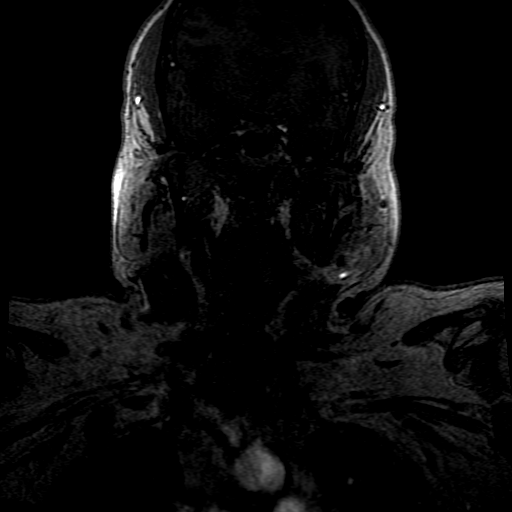
[im 37/109]
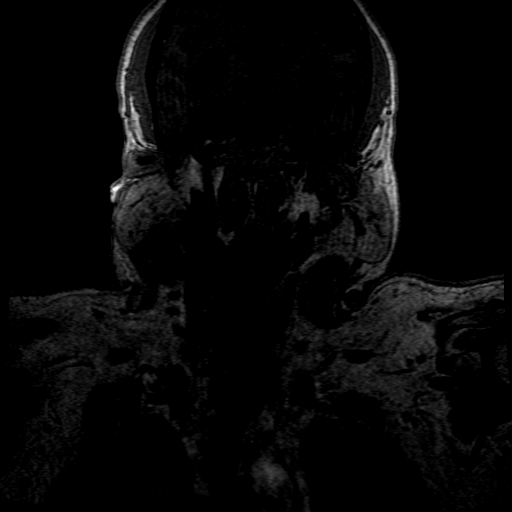
[im 55/109]
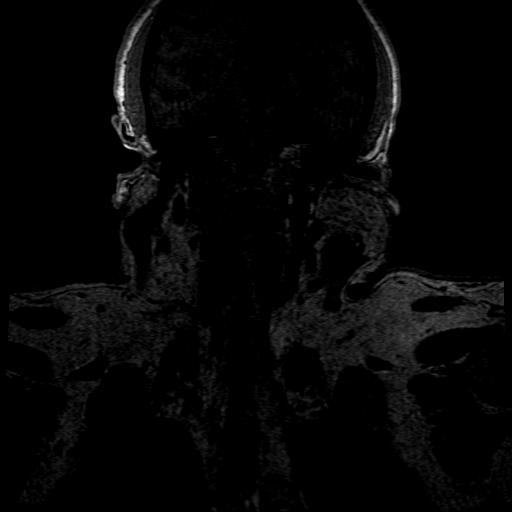
[im 73/109]
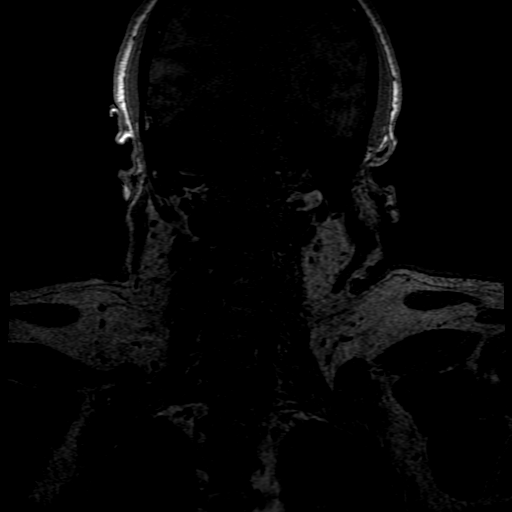
[im 91/109]
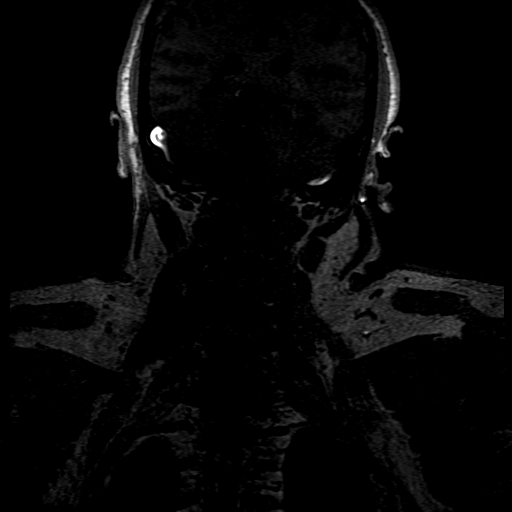
[im 109/109]
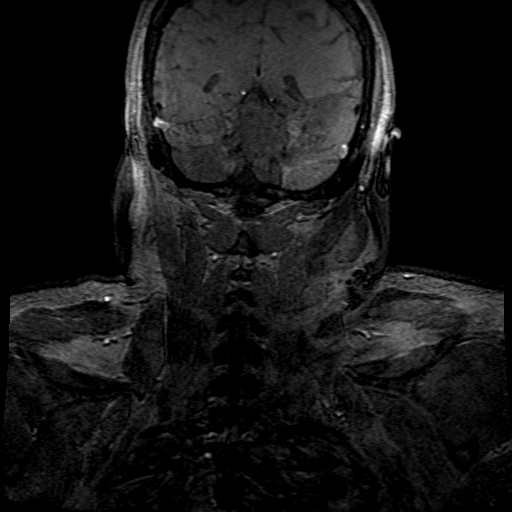

[Series 401: ph1/cor cemra ft · coronal · 1.2mm · 0.59mm/px · 6 of 108 slices shown]
[im 1/108]
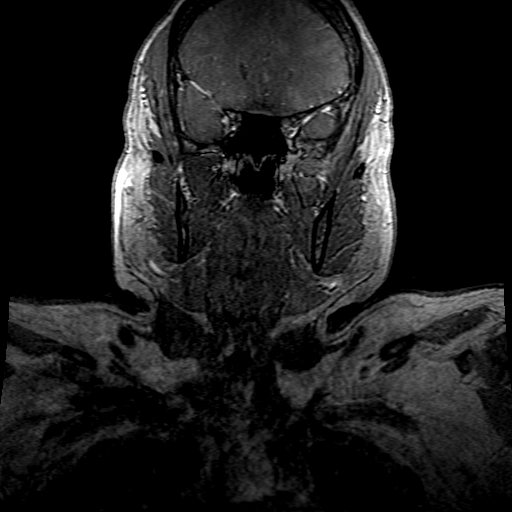
[im 18/108]
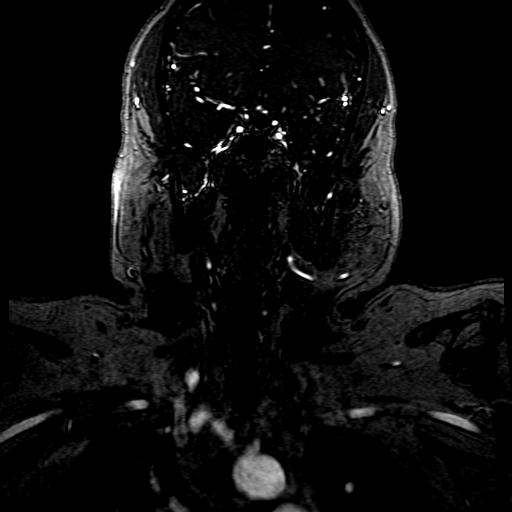
[im 36/108]
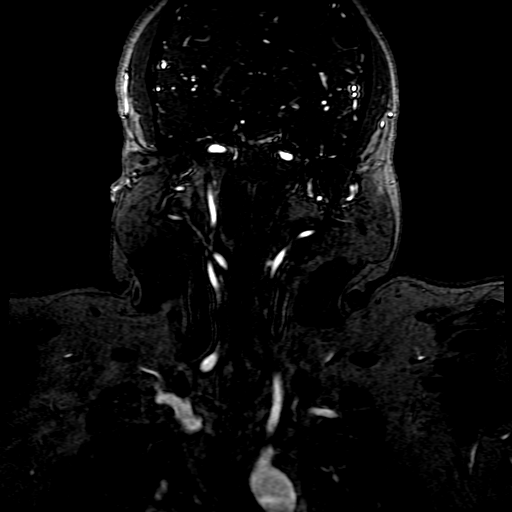
[im 54/108]
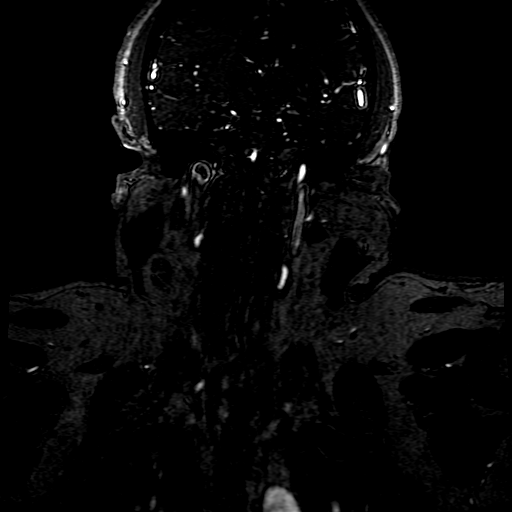
[im 72/108]
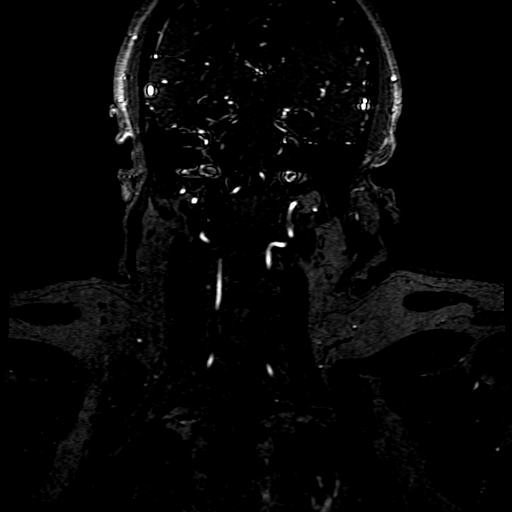
[im 90/108]
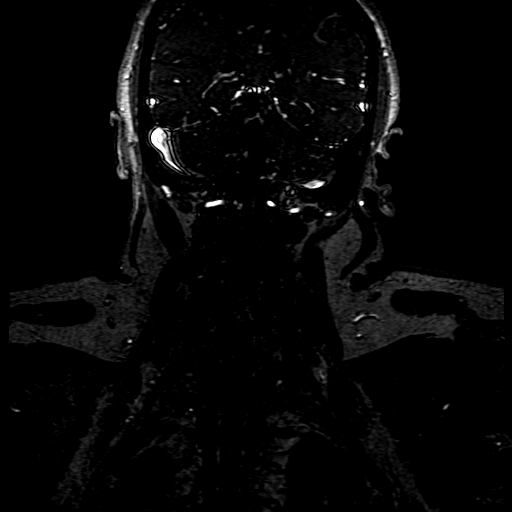

[Series 402: ph2/cor cemra ft · coronal · 1.2mm · 0.59mm/px · 3 of 109 slices shown]
[im 19/109]
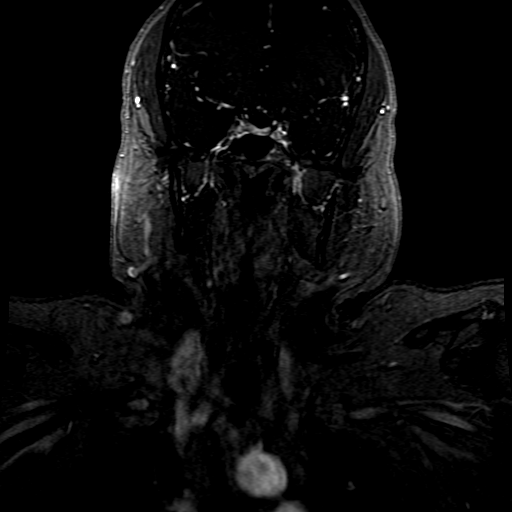
[im 55/109]
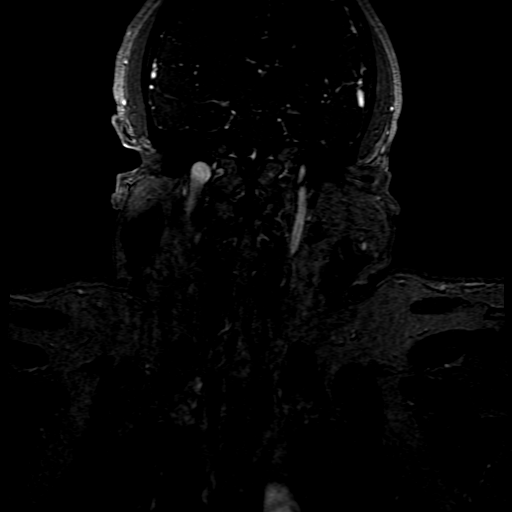
[im 91/109]
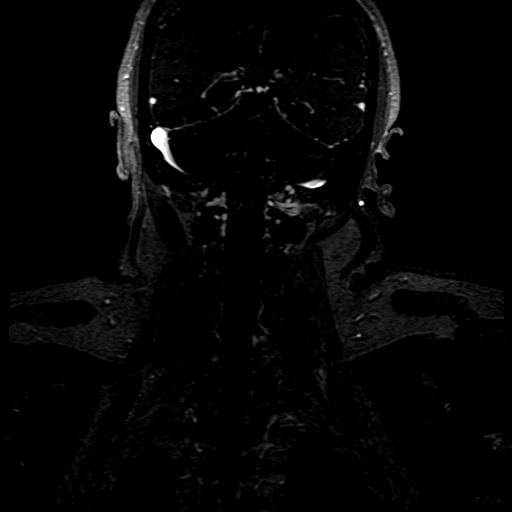

[((date))-((date)) · coronal · 1.2mm · 0.59mm/px · 3 of 109 slices shown]
[im 19/109]
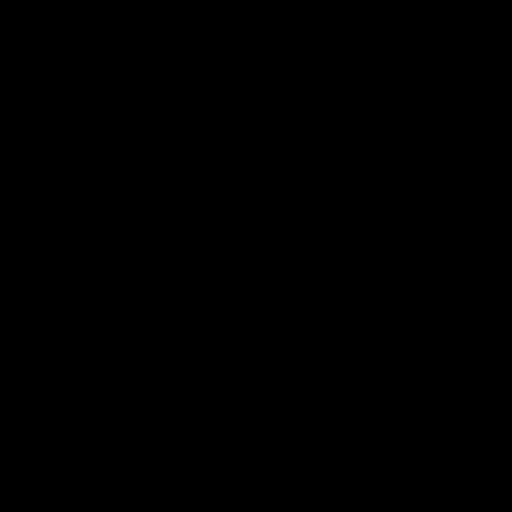
[im 55/109]
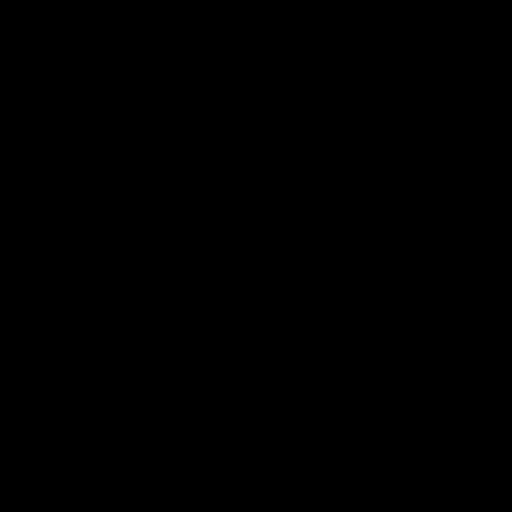
[im 91/109]
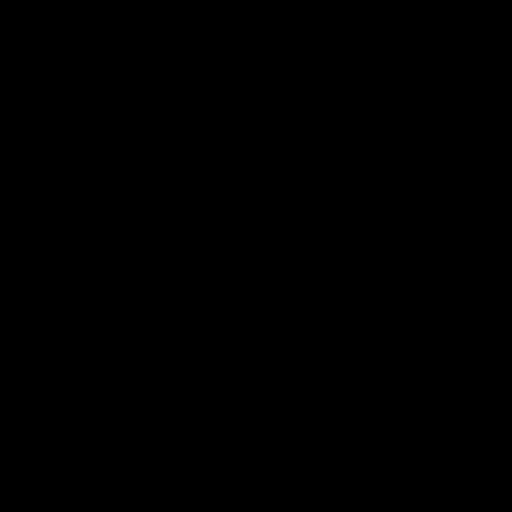

[19 of 48 positions shown; findings below may reference images not displayed]

FINDINGS: Three-vessel branching pattern the aortic arch.

Mild atherosclerotic plaque within the proximal right internal
carotid artery. No hemodynamically significant stenosis

Normal left carotid system.

Vertebral arteries are normal.
IMPRESSION: No hemodynamically significant stenosis of the carotid or vertebral
arteries.

## 2021-04-06 IMAGING — CT CT ANGIO NECK
2 of 7 series · 8 of 33 positions shown · IV contrast (omnipaque)
Comparison: MRI and MRA from earlier the same day

CLINICAL DATA: Stroke workup.

EXAM:
CT ANGIOGRAPHY NECK
TECHNIQUE: Multidetector CT imaging of the neck was performed using the
standard protocol during bolus administration of intravenous
contrast. Multiplanar CT image reconstructions and MIPs were
obtained to evaluate the vascular anatomy. Carotid stenosis
measurements (when applicable) are obtained utilizing NASCET
criteria, using the distal internal carotid diameter as the
denominator.
CONTRAST:  75mL OMNIPAQUE IOHEXOL 350 MG/ML SOLN

[Series 5: cta neck · axial · 0.37mm/px · z∈[-424,-352]mm · 2 of 108 slices shown]
[im 36/108  soft-tissue]
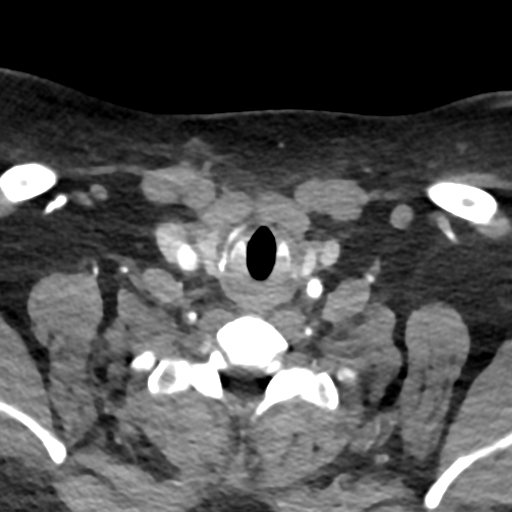
[im 72/108  soft-tissue]
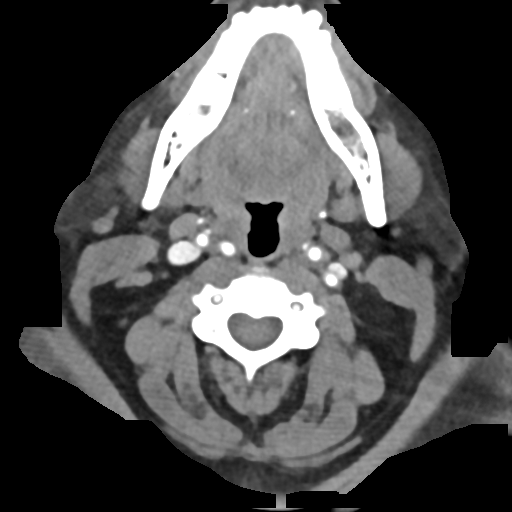

[Series 7: cta neck axial · axial · 0.39mm/px · z∈[-474,-322]mm · 6 of 215 slices shown]
[im 31/215  soft-tissue]
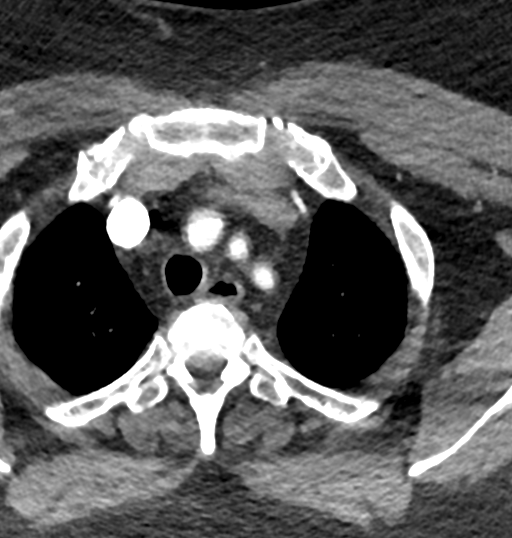
[im 62/215  bone]
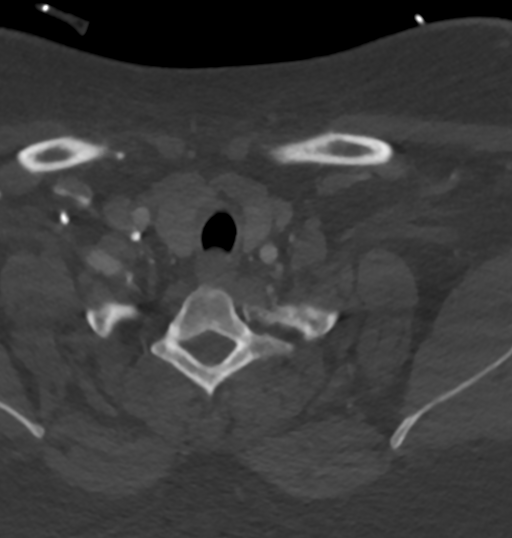
[im 92/215  soft-tissue]
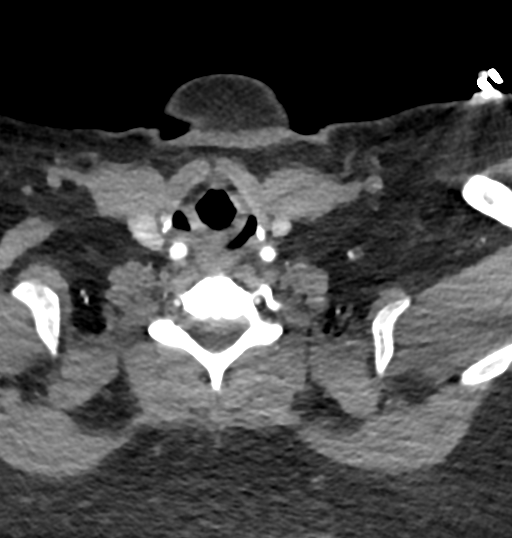
[im 123/215  bone]
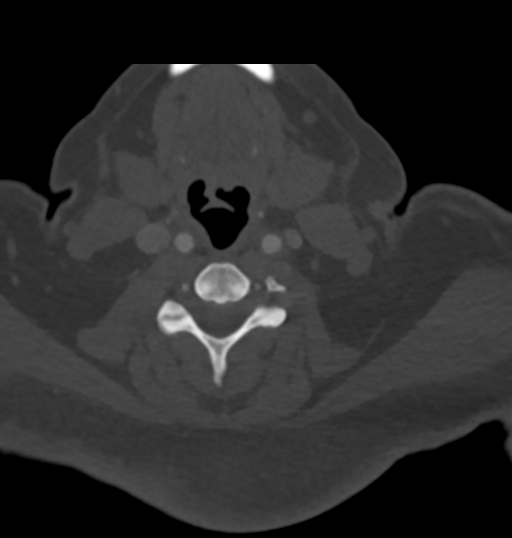
[im 153/215  soft-tissue]
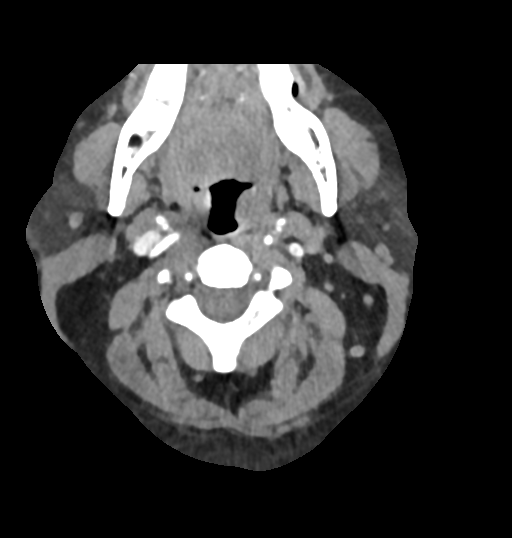
[im 184/215  bone]
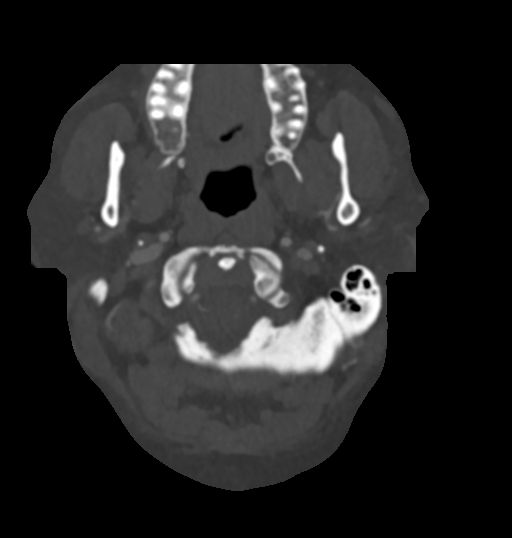

[8 of 33 positions shown; findings below may reference images not displayed]

FINDINGS: Aortic arch: Atheromatous plaque.

Right carotid system: Atheromatous plaque with mild-to-moderate
irregularity at the proximal ICA. Stenosis measures up to 30%. No
ulceration or dissection.

Left carotid system: Calcified plaque at the bifurcation and
proximal bulb without flow limiting stenosis. Mild plaque
heterogeneity.

Vertebral arteries: Proximal subclavian atherosclerosis. Calcified
plaque at the right V4 segment without flow limiting stenosis.

Skeleton: Ordinary cervical spine degeneration.

Other neck: Negative

Upper chest: Negative
IMPRESSION: Cervical carotid bifurcation atherosclerosis with greater
heterogeneity of plaque on the right. No flow limiting stenosis.

## 2021-04-06 MED ORDER — CLOPIDOGREL BISULFATE 75 MG PO TABS
75.0000 mg | ORAL_TABLET | Freq: Every day | ORAL | Status: DC
  Administered 2021-04-06 – 2021-04-07 (×2): 75 mg via ORAL
  Filled 2021-04-06 (×2): qty 1

## 2021-04-06 MED ORDER — GADOBUTROL 1 MMOL/ML IV SOLN
10.0000 mL | Freq: Once | INTRAVENOUS | Status: AC | PRN
  Administered 2021-04-06: 10 mL via INTRAVENOUS

## 2021-04-06 MED ORDER — CARVEDILOL 6.25 MG PO TABS
6.2500 mg | ORAL_TABLET | Freq: Two times a day (BID) | ORAL | Status: DC
  Administered 2021-04-06 – 2021-04-07 (×2): 6.25 mg via ORAL
  Filled 2021-04-06: qty 1
  Filled 2021-04-06: qty 2

## 2021-04-06 MED ORDER — STROKE: EARLY STAGES OF RECOVERY BOOK
Status: AC
  Filled 2021-04-06: qty 1

## 2021-04-06 MED ORDER — IOHEXOL 350 MG/ML SOLN
75.0000 mL | Freq: Once | INTRAVENOUS | Status: AC | PRN
  Administered 2021-04-06: 75 mL via INTRAVENOUS

## 2021-04-06 MED ORDER — INSULIN ASPART 100 UNIT/ML IJ SOLN
0.0000 [IU] | Freq: Three times a day (TID) | INTRAMUSCULAR | Status: DC
  Administered 2021-04-07: 15 [IU] via SUBCUTANEOUS
  Administered 2021-04-07: 11 [IU] via SUBCUTANEOUS

## 2021-04-06 NOTE — ED Notes (Signed)
The pt has no speech difficulty

## 2021-04-06 NOTE — Progress Notes (Signed)
PROGRESS NOTE    Barbara Jacobs  FUX:[id number] DOB: 04/19/1971 DOA: 04/05/2021 PCP: Verlon Au, MD   Brief Narrative: Barbara NORED is a 50 y.o. female with a history of CAD, hypertension, hyperlipidemia, diabetes mellitus type 2. Patient presented secondary to aphasia and found to have an acute left basal ganglia brain infarct.    Assessment & Plan:   Principal Problem:   Acute ischemic stroke [facility]) Active Problems:   Hyperlipidemia   HTN (hypertension)   Poorly controlled type 2 diabetes mellitus with circulatory disorder (HCC)   Acute ischemic stroke Associated aphasia. MRI brain significant for a left basal ganglia infarct. Neurology consulted. CTA head/neck without severe stenosis. LDL of 78. Hemoglobin A1C of 8.6%. OT recommending outpatient OT -SLP recommendations -Transthoracic Echocardiogram w/ bubble study pending -Neurology recommendations: aspirin/Plavix, pending today  Primary hypertension Patient is on lisinopril, Coreg as an outpatient. Uncontrolled in setting of permissive hypertension secondary to above -Resume home Coreg  Diabetes mellitus, type 2 Patient is on Trulicity, metformin and glipizide as an outpatient. Hemoglobin a1C of 8.6%. -SSI  Hyperlipidemia LDL of 78. Currently on high dose statin therapy with Lipitor 80 mg daily as an outpatient. 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  Insomnia -Continue home Ambien    DVT prophylaxis: Lovenox Code Status:   Code Status: Full Code Family Communication: None at bedside Disposition Plan: Discharge home likely in 24 hours pending neurology recommendations/continued stroke workup   Consultants:  Neurology  Procedures:  LOWER EXTREMITY VENOUS DUPLEX (04/06/2021) Summary:  BILATERAL:  - No evidence of deep vein thrombosis seen in the lower extremities,  bilaterally.  -No evidence of popliteal cyst, bilaterally.    Antimicrobials: None    Subjective: No issues overnight. Still with some word finding difficulties  Objective: Vitals:   04/06/21 0644 04/06/21 0752 04/06/21 1006 04/06/21 1152  BP:  (!) 167/99 (!) 165/93 (!) 172/99  Pulse:  76  90  Resp:  (!) 21  19  Temp:  98.3 F (36.8 C)  98.7 F (37.1 C)  TempSrc:  Oral  Oral  SpO2:  96%  97%  Weight: 114.2 kg     Height: 5\' 5"  (1.651 m)       Intake/Output Summary (Last 24 hours) at 04/06/2021 1600 Last data filed at 04/06/2021 0830 Gross per 24 hour  Intake 240 ml  Output --  Net 240 ml   Filed Weights   04/05/21 1516 04/06/21 0644  Weight: 115.2 kg 114.2 kg    Examination:  General exam: Appears calm and comfortable Respiratory system: Clear to auscultation. Respiratory effort normal. Cardiovascular system: S1 & S2 heard, RRR. No murmurs, rubs, gallops or clicks. Gastrointestinal system: Abdomen is nondistended, soft and nontender. No organomegaly or masses felt. Normal bowel sounds heard. Central nervous system: Alert and oriented. Mild aphasia Musculoskeletal: No edema. No calf tenderness Skin: No cyanosis. No rashes Psychiatry: Judgement and insight appear normal. Mood & affect appropriate.     Data Reviewed: I have personally reviewed following labs and imaging studies  CBC Lab Results  Component Value Date   WBC 11.0 (H) 04/05/2021   RBC 3.91 04/05/2021   HGB 12.2 04/05/2021   HCT 36.0 04/05/2021   MCV 92.8 04/05/2021   MCH 29.4 04/05/2021   PLT 384 04/05/2021   MCHC 31.7 04/05/2021   RDW 12.4 04/05/2021   LYMPHSABS 3.0 04/05/2021   MONOABS 0.7 04/05/2021   EOSABS 0.1 04/05/2021   BASOSABS 0.0  04/05/2021     Last metabolic panel Lab Results  Component Value Date   NA 136 04/05/2021   K 4.7 04/05/2021   CL 106 04/05/2021   CO2 22 04/05/2021   BUN 25 (H) 04/05/2021   CREATININE 1.00 04/05/2021   GLUCOSE 206 (H) 04/05/2021   GFRNONAA >60 04/05/2021   GFRAA >60 03/17/2019   CALCIUM 9.6  04/05/2021   PROT 7.3 04/05/2021   ALBUMIN 3.7 04/05/2021   BILITOT 0.7 04/05/2021   ALKPHOS 60 04/05/2021   AST 17 04/05/2021   ALT 11 04/05/2021   ANIONGAP 8 04/05/2021    CBG (last 3)  Recent Labs    04/06/21 0025 04/06/21 1206  GLUCAP 197* 337*     GFR: Estimated Creatinine Clearance: 84.9 mL/min (by C-G formula based on SCr of 1 mg/dL).  Coagulation Profile: Recent Labs  Lab 04/05/21 1535  INR 1.0    Recent Results (from the past 240 hour(s))  Resp Panel by RT-PCR (Flu A&B, Covid) Nasopharyngeal Swab     Status: None   Collection Time: 04/05/21  3:35 PM   Specimen: Nasopharyngeal Swab; Nasopharyngeal(NP) swabs in vial transport medium  Result Value Ref Range Status   SARS Coronavirus 2 by RT PCR NEGATIVE NEGATIVE Final    Comment: (NOTE) SARS-CoV-2 target nucleic acids are NOT DETECTED.  The SARS-CoV-2 RNA is generally detectable in upper respiratory specimens during the acute phase of infection. The lowest concentration of SARS-CoV-2 viral copies this assay can detect is 138 copies/mL. A negative result does not preclude SARS-Cov-2 infection and should not be used as the sole basis for treatment or other patient management decisions. A negative result may occur with  improper specimen collection/handling, submission of specimen other than nasopharyngeal swab, presence of viral mutation(s) within the areas targeted by this assay, and inadequate number of viral copies(<138 copies/mL). A negative result must be combined with clinical observations, patient history, and epidemiological information. The expected result is Negative.  Fact Sheet for Patients:  BloggerCourse.com  Fact Sheet for Healthcare Providers:  SeriousBroker.it  This test is no t yet approved or cleared by the [place] FDA and  has been authorized for detection and/or diagnosis of SARS-CoV-2 by FDA under an Emergency Use Authorization  (EUA). This EUA will remain  in effect (meaning this test can be used) for the duration of the COVID-19 declaration under Section 564(b)(1) of the Act, 21 U.S.C.section 360bbb-3(b)(1), unless the authorization is terminated  or revoked sooner.       Influenza A by PCR NEGATIVE NEGATIVE Final   Influenza B by PCR NEGATIVE NEGATIVE Final    Comment: (NOTE) The Xpert Xpress SARS-CoV-2/FLU/RSV plus assay is intended as an aid in the diagnosis of influenza from Nasopharyngeal swab specimens and should not be used as a sole basis for treatment. Nasal washings and aspirates are unacceptable for Xpert Xpress SARS-CoV-2/FLU/RSV testing.  Fact Sheet for Patients: BloggerCourse.com  Fact Sheet for Healthcare Providers: SeriousBroker.it  This test is not yet approved or cleared by the [place] FDA and has been authorized for detection and/or diagnosis of SARS-CoV-2 by FDA under an Emergency Use Authorization (EUA). This EUA will remain in effect (meaning this test can be used) for the duration of the COVID-19 declaration under Section 564(b)(1) of the Act, 21 U.S.C. section 360bbb-3(b)(1), unless the authorization is terminated or revoked.  Performed at [facility], [address] [place], [place] [address]         Radiology Studies: CT ANGIO  NECK W OR WO CONTRAST  Result Date: 04/06/2021 CLINICAL DATA:  Stroke workup. EXAM: CT ANGIOGRAPHY NECK TECHNIQUE: Multidetector CT imaging of the neck was performed using the standard protocol during bolus administration of intravenous contrast. Multiplanar CT image reconstructions and MIPs were obtained to evaluate the vascular anatomy. Carotid stenosis measurements (when applicable) are obtained utilizing NASCET criteria, using the distal internal carotid diameter as the denominator. CONTRAST:  4mL OMNIPAQUE IOHEXOL 350 MG/ML SOLN COMPARISON:  MRI and MRA from earlier the same day  FINDINGS: Aortic arch: Atheromatous plaque. Right carotid system: Atheromatous plaque with mild-to-moderate irregularity at the proximal ICA. Stenosis measures up to 30%. No ulceration or dissection. Left carotid system: Calcified plaque at the bifurcation and proximal bulb without flow limiting stenosis. Mild plaque heterogeneity. Vertebral arteries: Proximal subclavian atherosclerosis. Calcified plaque at the right V4 segment without flow limiting stenosis. Skeleton: Ordinary cervical spine degeneration. Other neck: Negative Upper chest: Negative IMPRESSION: Cervical carotid bifurcation atherosclerosis with greater heterogeneity of plaque on the right. No flow limiting stenosis. Electronically Signed   By: Marnee Spring M.D.   On: 04/06/2021 11:53   MR ANGIO HEAD WO CONTRAST  Result Date: 04/06/2021 CLINICAL DATA:  Stroke follow-up EXAM: MRA HEAD WITHOUT CONTRAST TECHNIQUE: Angiographic images of the Circle of Willis were acquired using MRA technique without intravenous contrast. COMPARISON:  No pertinent prior exam. FINDINGS: POSTERIOR CIRCULATION: --Vertebral arteries: Normal --Inferior cerebellar arteries: Normal. --Basilar artery: Normal. --Superior cerebellar arteries: Normal. --Posterior cerebral arteries: Normal. 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 --Anterior cerebral arteries (ACA): Normal. --Middle cerebral arteries (MCA): Normal. ANATOMIC VARIANTS: Fetal origin of the right PCA. IMPRESSION: 1. No emergent large vessel occlusion or high-grade stenosis. 2. 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 Electronically Signed   By: Deatra Robinson M.D.   On: 04/06/2021 02:41   MR ANGIO NECK W WO CONTRAST  Result Date: 04/06/2021 CLINICAL DATA:  Stroke follow-up EXAM: MRA NECK WITHOUT AND WITH CONTRAST TECHNIQUE: Multiplanar and multiecho pulse  sequences of the neck were obtained without and with intravenous contrast. Angiographic images of the neck were obtained using MRA technique without and with intravenous contrast. CONTRAST:  51mL GADAVIST GADOBUTROL 1 MMOL/ML IV SOLN COMPARISON:  None. FINDINGS: Three-vessel branching pattern the aortic arch. Mild atherosclerotic plaque within the proximal right internal carotid artery. No hemodynamically significant stenosis Normal left carotid system. Vertebral arteries are normal. IMPRESSION: No hemodynamically significant stenosis of the carotid or vertebral arteries. Electronically Signed   By: Deatra Robinson M.D.   On: 04/06/2021 02:56   MR BRAIN WO CONTRAST  Result Date: 04/05/2021 CLINICAL DATA:  Acute neurologic deficit EXAM: MRI HEAD WITHOUT CONTRAST TECHNIQUE: Multiplanar, multiecho pulse sequences of the brain and surrounding structures were obtained without intravenous contrast. COMPARISON:  None. FINDINGS: Brain: Intermediate sized area of acute ischemia in the left basal ganglia. No acute or chronic hemorrhage. Normal white matter signal, parenchymal volume and CSF spaces. The midline structures are normal. Vascular: Major flow voids are preserved. Skull and upper cervical spine: Normal calvarium and skull base. Visualized upper cervical spine and soft tissues are normal. Sinuses/Orbits:No paranasal sinus fluid levels or advanced mucosal thickening. No mastoid or middle ear effusion. Normal orbits. Other: None. IMPRESSION: Intermediate sized area of acute ischemia in the left basal ganglia. No hemorrhage or mass effect. Electronically Signed   By: Deatra Robinson M.D.   On: 04/05/2021 22:17  VAS US LOWER EXTREMITY VENOUS (DVT)  Result Date: 04/06/2021  Lower Venous DVT Study Patient Name:  Beverely LowJULIA L MOORE-EVANS  Date of Exam:   04/06/2021 Medical Rec #: [id number]            Accession #:    [id number] Date of Birth: 03-Mar-1971            Patient Gender: F Patient Age:   [age] years [facility] Procedure:      VAS US LOWER EXTREMITY VENOUS (DVT) Referring Phys: Scheryl MartenJINDONG XU --------------------------------------------------------------------------------  Indications: Stroke.  Comparison Study: No prior study on file Performing Technologist: Sherren Kernsandace Kanady RVS  Examination Guidelines: A complete evaluation includes B-mode imaging, spectral Doppler, color Doppler, and power Doppler as needed of all accessible portions of each vessel. Bilateral testing is considered an integral part of a complete examination. Limited examinations for reoccurring indications may be performed as noted. The reflux portion of the exam is performed with the patient in reverse Trendelenburg.  +---------+---------------+---------+-----------+----------+--------------+ RIGHT    CompressibilityPhasicitySpontaneityPropertiesThrombus Aging +---------+---------------+---------+-----------+----------+--------------+ CFV      Full           Yes      Yes                                 +---------+---------------+---------+-----------+----------+--------------+ SFJ      Full                                                        +---------+---------------+---------+-----------+----------+--------------+ FV Prox  Full                                                        +---------+---------------+---------+-----------+----------+--------------+ FV Mid   Full                                                        +---------+---------------+---------+-----------+----------+--------------+ FV DistalFull                                                        +---------+---------------+---------+-----------+----------+--------------+ PFV      Full                                                        +---------+---------------+---------+-----------+----------+--------------+ POP      Full           Yes      Yes                                  +---------+---------------+---------+-----------+----------+--------------+  PTV      Full                                                        +---------+---------------+---------+-----------+----------+--------------+ PERO     Full                                                        +---------+---------------+---------+-----------+----------+--------------+   +---------+---------------+---------+-----------+----------+--------------+ LEFT     CompressibilityPhasicitySpontaneityPropertiesThrombus Aging +---------+---------------+---------+-----------+----------+--------------+ CFV      Full           Yes      Yes                                 +---------+---------------+---------+-----------+----------+--------------+ SFJ      Full                                                        +---------+---------------+---------+-----------+----------+--------------+ FV Prox  Full                                                        +---------+---------------+---------+-----------+----------+--------------+ FV Mid   Full                                                        +---------+---------------+---------+-----------+----------+--------------+ FV DistalFull                                                        +---------+---------------+---------+-----------+----------+--------------+ PFV      Full                                                        +---------+---------------+---------+-----------+----------+--------------+ POP      Full           Yes      Yes                                 +---------+---------------+---------+-----------+----------+--------------+ PTV      Full                                                        +---------+---------------+---------+-----------+----------+--------------+  PERO     Full                                                         +---------+---------------+---------+-----------+----------+--------------+     Summary: BILATERAL: - No evidence of deep vein thrombosis seen in the lower extremities, bilaterally. -No evidence of popliteal cyst, bilaterally.   *See table(s) above for measurements and observations. Electronically signed by Waverly Ferrari MD on 04/06/2021 at 12:00:45 PM.    Final         Scheduled Meds:  aspirin  81 mg Oral Daily   atorvastatin  80 mg Oral q1800   clopidogrel  75 mg Oral Daily   enoxaparin (LOVENOX) injection  40 mg Subcutaneous Q24H   insulin aspart  0-15 Units Subcutaneous TID WC   insulin aspart  0-5 Units Subcutaneous QHS   multivitamin with minerals  1 tablet Oral Daily   sertraline  150 mg Oral Daily   Continuous Infusions:   LOS: 1 day     [name], MD [facility] 04/06/2021, 4:00 PM  If 7PM-7AM, please contact night-coverage [email or web address]

## 2021-04-06 NOTE — Consult Note (Addendum)
NEUROLOGY CONSULTATION NOTE   Date of service: April 06, 2021 Patient Name: Barbara Jacobs MRN:  [id number] DOB:  11-Aug-1970 Reason for consult: "Left basal ganglia stroke." Requesting Provider: Hillary BowGardner, Jared M, DO _ _ _   _ __   _ __ _ _  __ __   _ __   __ _  History of Present Illness  Barbara Jacobs is a 50 y.o. female with PMH significant for CAD, hypertension, hyperlipidemia, prior MI, obesity, diabetes with vascular disease who presents with speech apraxia and really having to focus to get her words out.  She works as a Engineer, siteschool teacher and reports that her symptoms have been going on for the last 2 days.  She is also noticed that while writing on the board that her right hand feels a little clumsy.  Symptoms have been persistent for the last 48 hours so she eventually came into the emergency department. she has no prior history of similar symptoms.  Has been trying to control her diabetes and her A1c was last 9.0.  No prior history of strokes and no family history of strokes.  She does not smoke, does not drink alcohol, does not use any recreational drugs.  She does not take an aspirin at home.  In the ED work-up with MRI brain without contrast demonstrated a left basal ganglia lacunar stroke.  MRS: 0 tPA/thrombectomy: Not offered due to outside of window. NIHSS components Score: Comment  1a Level of Conscious 0[x]  1[]  2[]  3[]      1b LOC Questions 0[x]  1[]  2[]       1c LOC Commands 0[x]  1[]  2[]       2 Best Gaze 0[x]  1[]  2[]       3 Visual 0[x]  1[]  2[]  3[]      4 Facial Palsy 0[x]  1[]  2[]  3[]      5a Motor Arm - left 0[x]  1[]  2[]  3[]  4[]  UN[]    5b Motor Arm - Right 0[x]  1[]  2[]  3[]  4[]  UN[]    6a Motor Leg - Left 0[x]  1[]  2[]  3[]  4[]  UN[]    6b Motor Leg - Right 0[x]  1[]  2[]  3[]  4[]  UN[]    7 Limb Ataxia 0[x]  1[]  2[]  3[]  UN[]     8 Sensory 0[x]  1[]  2[]  UN[]      9 Best Language 0[x]  1[]  2[]  3[]      10 Dysarthria 0[x]  1[]  2[]  UN[]      11 Extinct. and Inattention 0[x]  1[]  2[]        TOTAL: 0       ROS   Constitutional Denies weight loss, fever and chills.   HEENT Denies changes in vision and hearing.   Respiratory Denies SOB and cough.   CV Denies palpitations and CP   GI Denies abdominal pain, nausea, vomiting and diarrhea.   GU Denies dysuria and urinary frequency.   MSK Denies myalgia and joint pain.   Skin Denies rash and pruritus.   Neurological Denies headache and syncope.   Psychiatric Denies recent changes in mood. Denies anxiety and depression.    Past History   Past Medical History:  Diagnosis Date  . CAD (coronary artery disease) 05/04/2014  . Coronary artery disease    a. inferior STEMI 03/2009 s/p BMS to prox PDA. b. NSTEMI 04/2014: mild diffuse nonobstructive CAD, continued patency of previously placed PDA, no clear culprit. Amlodipine and Plavix added.  . H/O medication noncompliance   . HTN (hypertension) 08/31/2012  . Hyperlipidemia   . Hypertension   . Hypertriglyceridemia 02/13/2016  . Morbid  obesity (HCC)   . Myocardial infarction (HCC)   . Obesity, Class II, BMI 35-39.9, with comorbidity 02/13/2016  . PMDD (premenstrual dysphoric disorder) 08/31/2012  . Poorly controlled type 2 diabetes mellitus with circulatory disorder (HCC) 03/30/2015  . Type 2 diabetes mellitus with vascular disease [facility])    Past Surgical History:  Procedure Laterality Date  . CORONARY STENT PLACEMENT    . LEFT HEART CATHETERIZATION WITH CORONARY ANGIOGRAM N/A 04/17/2014   Procedure: LEFT HEART CATHETERIZATION WITH CORONARY ANGIOGRAM;  Surgeon: Micheline Chapman, MD;  Location: [facility] CATH LAB;  Service: Cardiovascular;  Laterality: N/A;   Family History  Problem Relation Age of Onset  . Diabetes Mother   . Hypertension Mother   . Breast cancer Mother   . Diabetes Father   . COPD Father   . Hypertension Father   . Hypertension Sister   . Kidney disease Sister   . Diabetes Maternal Grandmother   . Hypertension Maternal Grandmother   . Heart disease Maternal  Grandmother   . Hypertension Paternal Grandmother   . Diabetes Paternal Grandfather   . Heart attack Neg Hx   . Stroke Neg Hx    Social History   Socioeconomic History  . Marital status: Single    Spouse name: Not on file  . Number of children: Not on file  . Years of education: Not on file  . Highest education level: Not on file  Occupational History  . Not on file  Tobacco Use  . Smoking status: Never  . Smokeless tobacco: Never  Vaping Use  . Vaping Use: Never used  Substance and Sexual Activity  . Alcohol use: Yes    Comment: pt drinks twice a month  . Drug use: No  . Sexual activity: Yes    Birth control/protection: Pill, Condom  Other Topics Concern  . Not on file  Social History Narrative  . Not on file   Social Determinants of Health   Financial Resource Strain: Not on file  Food Insecurity: Not on file  Transportation Needs: Not on file  Physical Activity: Not on file  Stress: Not on file  Social Connections: Not on file   Allergies  Allergen Reactions  . Ibuprofen Swelling    Throat swells shut  . Naproxen     Swell up and throat swells shut  . Pineapple   . Shellfish Allergy Hives, Itching and Swelling    Medications  (Not in a hospital admission)    Vitals   Vitals:   04/05/21 1516 04/05/21 1815 04/05/21 2045 04/05/21 2300  BP:  (!) 150/120 (!) 164/87 (!) 167/99  Pulse:  74 79 79  Resp:  17 18 18   Temp:      TempSrc:      SpO2:  98% 98% 99%  Weight: 115.2 kg     Height: 5\' 6"  (1.676 m)        Body mass index is 41 kg/m.  Physical Exam   General: Laying comfortably in bed; in no acute distress.  HENT: Normal oropharynx and mucosa. Normal external appearance of ears and nose.  Neck: Supple, no pain or tenderness  CV: No JVD. No peripheral edema.  Pulmonary: Symmetric Chest rise. Normal respiratory effort.  Abdomen: Soft to touch, non-tender.  Ext: No cyanosis, edema, or deformity  Skin: No rash. Normal palpation of skin.    Musculoskeletal: Normal digits and nails by inspection. No clubbing.   Neurologic Examination  Mental status/Cognition: Alert, oriented to self, place, month and  year, good attention.  Speech/language: mild decrease in fluency and getting stuck on some words here and there. Comprehension intact, object naming intact, repetition intact.  Cranial nerves:   CN II Pupils equal and reactive to light, no VF deficits    CN III,IV,VI EOM intact, no gaze preference or deviation, no nystagmus    CN V normal sensation in V1, V2, and V3 segments bilaterally    CN VII no asymmetry, no nasolabial fold flattening    CN VIII normal hearing to speech    CN IX & X normal palatal elevation, no uvular deviation    CN XI 5/5 head turn and 5/5 shoulder shrug bilaterally    CN XII midline tongue protrusion    Motor:  Muscle bulk: poor, tone normal, pronator drift none tremor none Mvmt Root Nerve  Muscle Right Left Comments  SA C5/6 Ax Deltoid 4+ 4+   EF C5/6 Mc Biceps 5 5   EE C6/7/8 Rad Triceps 5 5   WF C6/7 Med FCR     WE C7/8 PIN ECU     F Ab C8/T1 U ADM/FDI 5 5   HF L1/2/3 Fem Illopsoas 4+ 4+   KE L2/3/4 Fem Quad 5 5   DF L4/5 D Peron Tib Ant 5 5   PF S1/2 Tibial Grc/Sol 5 5    Reflexes:  Right Left Comments  Pectoralis      Biceps (C5/6) 1 1   Brachioradialis (C5/6) 1 1    Triceps (C6/7) 1 1    Patellar (L3/4) 1 1    Achilles (S1)      Hoffman      Plantar     Jaw jerk    Sensation:  Light touch intact   Pin prick    Temperature    Vibration   Proprioception    Coordination/Complex Motor:  - Finger to Nose intact BL - Heel to shin intact BL - Rapid alternating movement are slowed. - Gait: Deferred.  Labs   CBC:  Recent Labs  Lab 04/05/21 1535 04/05/21 1623  WBC 11.0*  --   NEUTROABS 7.2  --   HGB 11.5* 12.2  HCT 36.3 36.0  MCV 92.8  --   PLT 384  --     Basic Metabolic Panel:  Lab Results  Component Value Date   NA 136 04/05/2021   K 4.7 04/05/2021   CO2 22  04/05/2021   GLUCOSE 206 (H) 04/05/2021   BUN 25 (H) 04/05/2021   CREATININE 1.00 04/05/2021   CALCIUM 9.6 04/05/2021   GFRNONAA >60 04/05/2021   GFRAA >60 03/17/2019   Lipid Panel:  Lab Results  Component Value Date   LDLCALC 126 (H) 06/21/2014   HgbA1c:  Lab Results  Component Value Date   HGBA1C 6.9 02/13/2016   Urine Drug Screen:     Component Value Date/Time   LABOPIA NONE DETECTED 04/05/2021 2110   COCAINSCRNUR NONE DETECTED 04/05/2021 2110   LABBENZ NONE DETECTED 04/05/2021 2110   AMPHETMU NONE DETECTED 04/05/2021 2110   THCU NONE DETECTED 04/05/2021 2110   LABBARB NONE DETECTED 04/05/2021 2110    Alcohol Level     Component Value Date/Time   ETH <10 04/05/2021 1535    MRI Brain: I personally reviewed MRI brain without contrast which demonstrated a left basal ganglia stroke.  The stroke appears lacunar. Impression   SHANTEE HAYNE is a 50 y.o. female with PMH significant for multiple stroke risk factors including CAD, hypertension, hyperlipidemia, prior MI,  obesity, diabetes, who presents with speech apraxia and mild decrease in the fluency of her speech.  She was found to have a left basal ganglia lacunar stroke.  Her neuro exam is also notable for slowed rapid alternating movements.  Primary Diagnosis:  Other cerebral infarction due to occlusion of stenosis of small artery.  Secondary Diagnosis: Essential (primary) hypertension, Type 2 diabetes mellitus with hyperglycemia , and Morbid Obesity(BMI > 40)  Recommendations  Plan:  Recommend that primary team order following: - Frequent Neuro checks per stroke unit protocol - Recommend Vascular imaging with MRA Angio Head without contrast and US Carotid doppler - Recommend obtaining TTE with bubble study - Recommend obtaining Lipid panel with LDL - Please start statin if LDL > 70 - Recommend HbA1c - Antithrombotic - Aspirin 81mg  daily along with plavix 75mg  daily for 21 days, followed by Asprin 81mg   daily alone. - Recommend DVT ppx - SBP goal - permissive hypertension first 24 h < 220/110. Held home meds.  - Recommend Telemetry monitoring for arrythmia - Recommend bedside swallow screen prior to PO intake. - Stroke education booklet - Recommend PT/OT/SLP consult   ______________________________________________________________________  Plan discussed with Dr. .   Thank you for the opportunity to take part in the care of this patient. If you have any further questions, please contact the neurology consultation attending.  Signed,  [facility] Pager Number _ _ _   _ __   _ __ _ _  __ __   _ __   __ _

## 2021-04-06 NOTE — Progress Notes (Signed)
PT Cancellation Note  Patient Details Name: Barbara Jacobs MRN: [id number] DOB: [birth year]   Cancelled Treatment:    Reason Eval/Treat Not Completed: Other (comment);Patient at procedure or test/unavailable.  Gone for imaging, recheck on return.   Ivar Drape 04/06/2021, 11:31 AM  Samul Dada, PT MS Acute Rehab Dept. Number: [facility] [id number] and [facility] [phone]

## 2021-04-06 NOTE — ED Notes (Signed)
Breakfast Ordered 

## 2021-04-06 NOTE — TOC Transition Note (Signed)
Transition of Care [facility]) - CM/SW Discharge Note   Patient Details  Name: Barbara Jacobs MRN: [id number] Date of Birth: 03-06-1971  Transition of Care [facility]) CM/SW Contact:  Lawerance Sabal, RN Phone Number: 04/06/2021, 3:43 PM   Clinical Narrative:    Requested by MD to set up OP OT and SLP. Spoke w patient, confirmed she could get to appointments. Referral placed electronically to [facility] Neuro for both therapies. Patient instructed to call Tuesday when office reopens to schedule appointment. AVS updated.    Final next level of care: Home/Self Care Barriers to Discharge: No Barriers Identified   Patient Goals and CMS Choice        Discharge Placement                       Discharge Plan and Services                                     Social Determinants of Health (SDOH) Interventions     Readmission Risk Interventions No flowsheet data found.

## 2021-04-06 NOTE — Progress Notes (Signed)
PT Cancellation Note  Patient Details Name: Barbara Jacobs MRN: [id number] DOB: 1971-03-03   Cancelled Treatment:    Pt is back from procedure, declines PT due to being at [facility] for all mobility.  Encouraged her to let nursing know if she decides she needs PT, but otherwise will dc PT order.   Ivar Drape 04/06/2021, 2:22 PM  Samul Dada, PT MS Acute Rehab Dept. Number: [facility] [id number] and [facility] [phone]

## 2021-04-06 NOTE — Progress Notes (Signed)
VASCULAR LAB    Bilateral lower extremity venous duplex has been performed.  See CV proc for preliminary results.   Arika Mainer, RVT 04/06/2021, 10:47 AM

## 2021-04-06 NOTE — ED Notes (Signed)
Report given to selenia rn on 3w

## 2021-04-06 NOTE — Progress Notes (Addendum)
STROKE TEAM PROGRESS NOTE   INTERVAL HISTORY No family is at the bedside.  Pt lying in bed, still has slow speech and hesitation of speech as well as right hand clumsiness. OT recommend outpt OT. PT pending. No DVT. Echo pending.   OBJECTIVE Vitals:   04/06/21 0642 04/06/21 0644 04/06/21 0752 04/06/21 1006  BP: (!) 171/105  (!) 167/99 (!) 165/93  Pulse: 85  76   Resp: 18  (!) 21   Temp: 98.3 F (36.8 C)  98.3 F (36.8 C)   TempSrc: Oral  Oral   SpO2: 99%  96%   Weight:  114.2 kg    Height:  5\' 5"  (1.651 m)      CBC:  Recent Labs  Lab 04/05/21 1535 04/05/21 1623  WBC 11.0*  --   NEUTROABS 7.2  --   HGB 11.5* 12.2  HCT 36.3 36.0  MCV 92.8  --   PLT 384  --     Basic Metabolic Panel:  Recent Labs  Lab 04/05/21 1535 04/05/21 1623  NA 135 136  K 4.7 4.7  CL 105 106  CO2 22  --   GLUCOSE 210* 206*  BUN 21* 25*  CREATININE 1.05* 1.00  CALCIUM 9.6  --     Lipid Panel:     Component Value Date/Time   CHOL 179 04/06/2021 0437   TRIG 308 (H) 04/06/2021 0437   HDL 39 (L) 04/06/2021 0437   CHOLHDL 4.6 04/06/2021 0437   VLDL 62 (H) 04/06/2021 0437   LDLCALC 78 04/06/2021 0437   HgbA1c:  Lab Results  Component Value Date   HGBA1C 8.6 (H) 04/06/2021   Urine Drug Screen:     Component Value Date/Time   LABOPIA NONE DETECTED 04/05/2021 2110   COCAINSCRNUR NONE DETECTED 04/05/2021 2110   LABBENZ NONE DETECTED 04/05/2021 2110   AMPHETMU NONE DETECTED 04/05/2021 2110   THCU NONE DETECTED 04/05/2021 2110   LABBARB NONE DETECTED 04/05/2021 2110    Alcohol Level     Component Value Date/Time   ETH <10 04/05/2021 1535    IMAGING  MR ANGIO HEAD WO CONTRAST Result Date: 04/06/2021 CLINICAL DATA:  Stroke follow-up EXAM: MRA HEAD WITHOUT CONTRAST TECHNIQUE: Angiographic images of the Circle of Willis were acquired using MRA technique without intravenous contrast. COMPARISON:  No pertinent prior exam. FINDINGS: POSTERIOR CIRCULATION: --Vertebral arteries: Normal  --Inferior cerebellar arteries: Normal. --Basilar artery: Normal. --Superior cerebellar arteries: Normal. --Posterior cerebral arteries: Normal. 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 --Anterior cerebral arteries (ACA): Normal. --Middle cerebral arteries (MCA): Normal. ANATOMIC VARIANTS: Fetal origin of the right PCA.  IMPRESSION:  1. No emergent large vessel occlusion or high-grade stenosis.  2. 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  Electronically Signed   By: 06/06/2021 M.D.   On: 04/06/2021 02:41   MR ANGIO NECK W WO CONTRAST Result Date: 04/06/2021 CLINICAL DATA:  Stroke follow-up EXAM: MRA NECK WITHOUT AND WITH CONTRAST TECHNIQUE: Multiplanar and multiecho pulse sequences of the neck were obtained without and with intravenous contrast. Angiographic images of the neck were obtained using MRA technique without and with intravenous contrast. CONTRAST:  68mL GADAVIST GADOBUTROL 1 MMOL/ML IV SOLN COMPARISON:  None. FINDINGS: Three-vessel branching pattern the aortic arch. Mild atherosclerotic plaque within the proximal right internal carotid artery. No hemodynamically significant stenosis Normal left carotid system. Vertebral arteries are normal.  IMPRESSION:  No  hemodynamically significant stenosis of the carotid or vertebral arteries.  Electronically Signed   By: Deatra Robinson M.D.   On: 04/06/2021 02:56   MR BRAIN WO CONTRAST Result Date: 04/05/2021 CLINICAL DATA:  Acute neurologic deficit EXAM: MRI HEAD WITHOUT CONTRAST TECHNIQUE: Multiplanar, multiecho pulse sequences of the brain and surrounding structures were obtained without intravenous contrast. COMPARISON:  None. FINDINGS: Brain: Intermediate sized area of acute ischemia in the left basal ganglia. No acute or chronic hemorrhage. Normal white matter signal, parenchymal volume and  CSF spaces. The midline structures are normal. Vascular: Major flow voids are preserved. Skull and upper cervical spine: Normal calvarium and skull base. Visualized upper cervical spine and soft tissues are normal. Sinuses/Orbits:No paranasal sinus fluid levels or advanced mucosal thickening. No mastoid or middle ear effusion. Normal orbits. Other: None.  IMPRESSION:  Intermediate sized area of acute ischemia in the left basal ganglia. No hemorrhage or mass effect.  Electronically Signed   By: Deatra Robinson M.D.   On: 04/05/2021 22:17   VAS [place] LOWER EXTREMITY VENOUS (DVT) Result Date: 04/06/2021  Lower Venous DVT Study Patient Name:  Barbara Jacobs  Date of Exam:   04/06/2021 Medical Rec #: [id number]            Accession #:    [id number] Date of Birth: Dec 17, 1970            Patient Gender: F Patient Age:   50 years Exam Location:  [facility] Procedure:      VAS [place] LOWER EXTREMITY VENOUS (DVT) Referring Phys: Scheryl Marten Korrin Waterfield --------------------------------------------------------------------------------  Indications: Stroke.  Comparison Study: No prior study on file Performing Technologist: Sherren Kerns RVS  Examination Guidelines: A complete evaluation includes B-mode imaging, spectral Doppler, color Doppler, and power Doppler as needed of all accessible portions of each vessel. Bilateral testing is considered an integral part of a complete examination. Limited examinations for reoccurring indications may be performed as noted. The reflux portion of the exam is performed with the patient in reverse Trendelenburg.  +---------+---------------+---------+-----------+----------+--------------+ RIGHT    CompressibilityPhasicitySpontaneityPropertiesThrombus Aging +---------+---------------+---------+-----------+----------+--------------+ CFV      Full           Yes      Yes                                 +---------+---------------+---------+-----------+----------+--------------+ SFJ      Full                                                         +---------+---------------+---------+-----------+----------+--------------+ FV Prox  Full                                                        +---------+---------------+---------+-----------+----------+--------------+ FV Mid   Full                                                        +---------+---------------+---------+-----------+----------+--------------+ FV DistalFull                                                        +---------+---------------+---------+-----------+----------+--------------+  PFV      Full                                                        +---------+---------------+---------+-----------+----------+--------------+ POP      Full           Yes      Yes                                 +---------+---------------+---------+-----------+----------+--------------+ PTV      Full                                                        +---------+---------------+---------+-----------+----------+--------------+ PERO     Full                                                        +---------+---------------+---------+-----------+----------+--------------+   +---------+---------------+---------+-----------+----------+--------------+ LEFT     CompressibilityPhasicitySpontaneityPropertiesThrombus Aging +---------+---------------+---------+-----------+----------+--------------+ CFV      Full           Yes      Yes                                 +---------+---------------+---------+-----------+----------+--------------+ SFJ      Full                                                        +---------+---------------+---------+-----------+----------+--------------+ FV Prox  Full                                                        +---------+---------------+---------+-----------+----------+--------------+ FV Mid   Full                                                         +---------+---------------+---------+-----------+----------+--------------+ FV DistalFull                                                        +---------+---------------+---------+-----------+----------+--------------+ PFV      Full                                                        +---------+---------------+---------+-----------+----------+--------------+  POP      Full           Yes      Yes                                 +---------+---------------+---------+-----------+----------+--------------+ PTV      Full                                                        +---------+---------------+---------+-----------+----------+--------------+ PERO     Full                                                        +---------+---------------+---------+-----------+----------+--------------+      Summary:  BILATERAL: - No evidence of deep vein thrombosis seen in the lower extremities, bilaterally. -No evidence of popliteal cyst, bilaterally.   *See table(s) above for measurements and observations.    Preliminary     ECG - SR rate 81 BPM. (See cardiology reading for complete details)  PHYSICAL EXAM  Temp:  [98.3 F (36.8 C)-98.7 F (37.1 C)] 98.7 F (37.1 C) (09/03 1152) Pulse Rate:  [74-90] 90 (09/03 1152) Resp:  [17-22] 19 (09/03 1152) BP: (150-172)/(81-120) 172/99 (09/03 1152) SpO2:  [96 %-99 %] 97 % (09/03 1152) Weight:  [710.6 kg] 114.2 kg (09/03 0644)  General - obese, well developed, in no apparent distress.  Ophthalmologic - fundi not visualized due to noncooperation.  Cardiovascular - Regular rhythm and rate.  Mental Status -  Level of arousal and orientation to time, place, and person were intact. Language including naming, repetition, comprehension was assessed and found intact. Intermittent hesitancy of speech and bradyphonia. Attention span and concentration were normal. Fund of Knowledge was assessed and was intact.  Cranial  Nerves II - XII - II - Visual field intact OU. III, IV, VI - Extraocular movements intact. V - Facial sensation intact bilaterally. VII - Facial movement intact bilaterally. VIII - Hearing & vestibular intact bilaterally. X - Palate elevates symmetrically. XI - Chin turning & shoulder shrug intact bilaterally. XII - Tongue protrusion intact.  Motor Strength - The patient's strength was normal in all extremities and pronator drift was absent except mild right hand decreased dexterity.  Bulk was normal and fasciculations were absent.   Motor Tone - Muscle tone was assessed at the neck and appendages and was normal.  Reflexes - The patient's reflexes were symmetrical in all extremities and she had no pathological reflexes.  Sensory - Light touch, temperature/pinprick were assessed and were symmetrical.    Coordination - The patient had normal movements in the hands with no ataxia or dysmetria.  Tremor was absent.  Gait and Station - deferred.   ASSESSMENT/PLAN [name] is a 49 y.o. 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 MRI brain without contrast demonstrated a left basal ganglia lacunar stroke. She did not receive tPA due to late presentation (>4.5 hours from time of onset).  Stroke: large left basal ganglia stroke - likely small vessel  disease, however, embolic infarct also possible given the size.  MRI head - large acute ischemia in the left BG and CR No hemorrhage or mass effect.  MRA head and neck - negative CTA Neck - unremarkable Bilateral LE Venous Dopplers - no DVT 2D Echo - pending May need to consider TCD bubble study in am based on TTE result Recommend 30 day cardiac event monitoring to rule out A. fib Sars Corona Virus 2 - negative LDL - 78 HgbA1c - 8.6 UDS - negative VTE prophylaxis - Lovenox No antithrombotic prior to  admission, now on aspirin 81 mg daily and clopidogrel 75 mg daily DAPT for 3 weeks and then aspirin alone Patient will be counseled to be compliant with her antithrombotic medications Ongoing aggressive stroke risk factor management Therapy recommendations: Outpatient PT/OT Disposition:  Pending   Hypertension Home BP meds: Coreg ; Zestril Stable on the high end Long-term BP goal normotensive   Hyperlipidemia Home Lipid lowering medication: Lipitor 80 mg daily  LDL 78, goal < 70 Current lipid lowering medication: Lipitor 80 mg daily  Continue statin at discharge   Diabetes Home diabetic meds: Truelicity ; Glucophage ; Glucotrol  HgbA1c 8.6, goal < 7.0 SSI CBG monitoring Close PCP follow-up for better DM control   Other Stroke Risk Factors ETOH use, advised to drink no more than 1 alcoholic beverage per day. 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      Hospital day # 1   Marvel PlanJindong Jaymon Dudek, MD PhD Stroke Neurology 04/06/2021 11:46 PM   To contact Stroke Continuity provider, please refer to [email or web address]. After hours, contact General Neurology

## 2021-04-06 NOTE — Evaluation (Addendum)
Occupational Therapy Evaluation and Discharge  Patient Details Name: Barbara Jacobs MRN: [id number] DOB: 1970-08-10 Today's Date: 04/06/2021    History of Present Illness This 50 y.o. female amditted with 18 hour h/o word finding difficulties.  MRI of brain showed a moderate sized acute ischemic stroke in Lt basal ganglia.  PMH includes: CAD, s/p inferior STEMI, HTN, obesity, DM,   Clinical Impression   Pt admitted with the above diagnosis and demonstrates.  She presents to deficits with communication, visual deficits including impaired saccades, as well as deficits with executive functioning.  She is able to complete ADLs and functional mobility independently, but has been struggling with work related tasks.  She was able to complete multi step commands and a mod challenging path finding task, but required increased time and effort to complete which she reports is not her baseline.  She lives with her spouse and was fully independent PTA including working full time as an [employer] and driving.  Recommend OP OT and OP SLP at discharge.  All further OT needs can be met by OPOT - acute OT will sign off at this time.     Follow Up Recommendations  Outpatient OT;Supervision - Intermittent; will also benefit from OP SLP.     Equipment Recommendations  None recommended by OT    Recommendations for Other Services       Precautions / Restrictions Precautions Precautions: None      Mobility Bed Mobility Overal bed mobility: Independent                  Transfers Overall transfer level: Independent                    Balance Overall balance assessment: No apparent balance deficits (not formally assessed)                                         ADL either performed or assessed with clinical judgement   ADL Overall ADL's : Independent                                             Vision Baseline Vision/History: 1 Wears  glasses Patient Visual Report: No change from baseline Vision Assessment?: Yes Eye Alignment: Within Functional Limits Ocular Range of Motion: Within Functional Limits Alignment/Gaze Preference: Within Defined Limits Tracking/Visual Pursuits: Other (comment) Saccades: Undershoots;Other (comment);Overshoots;Decreased speed of saccadic movement Convergence: Within functional limits Visual Fields: No apparent deficits Additional Comments: mild oscillopsia noted with pursuits. during saccades pt overshot, then undershot the target repeatedly on both Lt and Rt during dynamic saccades.  She denies dizziness     Perception Perception Perception Tested?: Yes   Praxis Praxis Praxis tested?: Within functional limits    Pertinent Vitals/Pain Pain Assessment: No/denies pain     Hand Dominance Right   Extremity/Trunk Assessment Upper Extremity Assessment Upper Extremity Assessment: Overall WFL for tasks assessed   Lower Extremity Assessment Lower Extremity Assessment: Overall WFL for tasks assessed   Cervical / Trunk Assessment Cervical / Trunk Assessment: Normal   Communication Communication Communication: Expressive difficulties   Cognition Arousal/Alertness: Awake/alert Behavior During Therapy: WFL for tasks assessed/performed Overall Cognitive Status: Impaired/Different from baseline  General Comments: Pt demonstrates deficits with executive functioning.  She requires increased effort and time to problem solve through multi step instruction and during way finding activities.  She was able to complete 4 step command and path finding tasks using signage, but reports feeling overwhelmed and having to think really hard to complete the task which is not her baseline.  she reports that she was struggling with organizing and problem solving through her treatment plans at home. She also endorses fatigue   General Comments  Pt tearful during  assessment - indicates feeling overwhelmed.  BP 175/109 at beginning of session, and 219/108 at end of session.  HR to 145 during activity - RN made aware    Exercises     Shoulder Instructions      Home Living Family/patient expects to be discharged to:: Private residence Living Arrangements: Spouse/significant other;Children Available Help at Discharge: Family;Available PRN/intermittently Type of Home: House Home Access: Stairs to enter     Home Layout: Two level (split level)                   Additional Comments: Pt independent with ambulation and has not been having difficulty with home access      Prior Functioning/Environment Level of Independence: Independent        Comments: Pt typically is independent with ADLs and IADLs.  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        OT Problem List: Decreased activity tolerance;Decreased cognition;Impaired vision/perception      OT Treatment/Interventions:      OT Goals(Current goals can be found in the care plan section) Acute Rehab OT Goals Patient Stated Goal: to get back to normal OT Goal Formulation: All assessment and education complete, DC therapy  OT Frequency:     Barriers to D/C:            Co-evaluation              AM-PAC OT "6 Clicks" Daily Activity     Outcome Measure Help from another person eating meals?: None Help from another person taking care of personal grooming?: None Help from another person toileting, which includes using toliet, bedpan, or urinal?: None Help from another person bathing (including washing, rinsing, drying)?: None Help from another person to put on and taking off regular upper body clothing?: None Help from another person to put on and taking off regular lower body clothing?: None 6 Click Score: 24   End of Session Nurse Communication: Mobility status  Activity Tolerance: Patient tolerated treatment well Patient left: in bed;with call bell/phone  within reach;with bed alarm set;with family/visitor present  OT Visit Diagnosis: Cognitive communication deficit (R41.841) Symptoms and signs involving cognitive functions: Cerebral infarction                Time: 9163-8466 OT Time Calculation (min): 34 min Charges:  OT General Charges $OT Visit: 1 Visit OT Evaluation $OT Eval Moderate Complexity: 1 Mod OT Treatments $Therapeutic Activity: 8-22 mins  Nilsa Nutting., OTR/L Acute Rehabilitation Services Pager [phone] Office [phone]   Lucille Passy M 04/06/2021, 10:16 AM

## 2021-04-06 NOTE — Evaluation (Signed)
Speech Language Pathology Evaluation Patient Details Name: PHYNIX HORTON MRN: [id number] DOB: 05/16/1971 Today's Date: 04/06/2021 Time: 7169-6789 SLP Time Calculation (min) (ACUTE ONLY): 22 min  Problem List:  Patient Active Problem List   Diagnosis Date Noted   Acute ischemic stroke (HCC) 04/05/2021   Obesity, Class II, BMI 35-39.9, with comorbidity 02/13/2016   Hypertriglyceridemia 02/13/2016   Poorly controlled type 2 diabetes mellitus with circulatory disorder (HCC) 03/30/2015   CAD (coronary artery disease) 05/04/2014   Hyperlipidemia 08/31/2012   HTN (hypertension) 08/31/2012   PMDD (premenstrual dysphoric disorder) 08/31/2012   Past Medical History:  Past Medical History:  Diagnosis Date   CAD (coronary artery disease) 05/04/2014   Coronary artery disease    a. inferior STEMI 03/2009 s/p BMS to prox PDA. b. NSTEMI 04/2014: mild diffuse nonobstructive CAD, continued patency of previously placed PDA, no clear culprit. Amlodipine and Plavix added.   H/O medication noncompliance    HTN (hypertension) 08/31/2012   Hyperlipidemia    Hypertension    Hypertriglyceridemia 02/13/2016   Morbid obesity (HCC)    Myocardial infarction (HCC)    Obesity, Class II, BMI 35-39.9, with comorbidity 02/13/2016   PMDD (premenstrual dysphoric disorder) 08/31/2012   Poorly controlled type 2 diabetes mellitus with circulatory disorder (HCC) 03/30/2015   Type 2 diabetes mellitus with vascular disease (HCC)    Past Surgical History:  Past Surgical History:  Procedure Laterality Date   CORONARY STENT PLACEMENT     LEFT HEART CATHETERIZATION WITH CORONARY ANGIOGRAM N/A 04/17/2014   Procedure: LEFT HEART CATHETERIZATION WITH CORONARY ANGIOGRAM;  Surgeon: Micheline Chapman, MD;  Location: [facility] CATH LAB;  Service: Cardiovascular;  Laterality: N/A;   HPI:  This 50 y.o. female amditted with 48 hour h/o word finding difficulties.  MRI of brain showed a moderate sized acute ischemic stroke in Lt basal  ganglia.  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 No difficulty with speech production. Language comprehension intact and expression included one phonemic paraphasia with self correction. Her processing was mildly slower than her suspected baseline. Cognitive performance on SLUMS was lower in memory (retrieval), numeric/calculation lost one point; error in clock drawing with length of hands scoring a 20/30. Outpatient ST recommended for executive functions for independence at home at in her middle school classroom.    SLP Assessment  SLP Recommendation/Assessment: All further Speech Lanaguage Pathology  needs can be addressed in the next venue of care SLP Visit Diagnosis: Cognitive communication deficit (R41.841)    Follow Up Recommendations  Outpatient SLP    Frequency and Duration           SLP Evaluation Cognition  Overall Cognitive Status: Impaired/Different from baseline Arousal/Alertness: Awake/alert Orientation Level: Oriented X4 Attention: Sustained Sustained Attention: Appears intact Memory: Impaired Memory Impairment: Retrieval deficit Awareness: Impaired Awareness Impairment: Emergent impairment Problem Solving: Impaired Problem Solving Impairment: Verbal complex Executive Function: Organizing;Sequencing Safety/Judgment: Appears intact       Comprehension  Auditory Comprehension Overall Auditory Comprehension: Appears within functional limits for tasks assessed Visual Recognition/Discrimination Discrimination: Not tested Reading Comprehension Reading Status: Not tested    Expression Expression Primary Mode of Expression: Verbal Verbal Expression Overall Verbal Expression: Appears within functional limits for tasks assessed Pragmatics: No impairment Written Expression Dominant Hand: Right Written Expression: Not tested   Oral  / Motor  Oral Motor/Sensory Function Overall Oral Motor/Sensory Function: Within functional limits Motor  Speech Overall Motor Speech: Appears within functional limits for tasks assessed Intelligibility: Intelligible Motor Planning: Witnin functional limits   GO                    Royce Macadamia 04/06/2021, 3:59 PM Breck Coons Lonell Face.Ed Nurse, children's [phone] Office [phone]

## 2021-04-06 NOTE — Plan of Care (Signed)
  Problem: Education: Goal: Knowledge of General Education information will improve Description: Including pain rating scale, medication(s)/side effects and non-pharmacologic comfort measures Outcome: Progressing   Problem: Health Behavior/Discharge Planning: Goal: Ability to manage health-related needs will improve Outcome: Progressing   Problem: Clinical Measurements: Goal: Ability to maintain clinical measurements within normal limits will improve Outcome: Progressing Goal: Will remain free from infection Outcome: Progressing Goal: Diagnostic test results will improve Outcome: Progressing Goal: Respiratory complications will improve Outcome: Progressing Goal: Cardiovascular complication will be avoided Outcome: Progressing   Problem: Activity: Goal: Risk for activity intolerance will decrease Outcome: Progressing   Problem: Nutrition: Goal: Adequate nutrition will be maintained Outcome: Progressing   Problem: Coping: Goal: Level of anxiety will decrease Outcome: Progressing   Problem: Elimination: Goal: Will not experience complications related to bowel motility Outcome: Progressing Goal: Will not experience complications related to urinary retention Outcome: Progressing   Problem: Pain Managment: Goal: General experience of comfort will improve Outcome: Progressing   Problem: Safety: Goal: Ability to remain free from injury will improve Outcome: Progressing   Problem: Skin Integrity: Goal: Risk for impaired skin integrity will decrease Outcome: Progressing   Problem: Education: Goal: Knowledge of disease or condition will improve Outcome: Progressing Goal: Knowledge of secondary prevention will improve Outcome: Progressing Goal: Knowledge of patient specific risk factors addressed and post discharge goals established will improve Outcome: Progressing Goal: Individualized Educational Video(s) Outcome: Progressing   Problem: Coping: Goal: Will verbalize  positive feelings about self Outcome: Progressing   Problem: Health Behavior/Discharge Planning: Goal: Ability to manage health-related needs will improve Outcome: Progressing   Problem: Self-Care: Goal: Ability to participate in self-care as condition permits will improve Outcome: Progressing Goal: Verbalization of feelings and concerns over difficulty with self-care will improve Outcome: Progressing Goal: Ability to communicate needs accurately will improve Outcome: Progressing   Problem: Nutrition: Goal: Risk of aspiration will decrease Outcome: Progressing Goal: Dietary intake will improve Outcome: Progressing   Problem: Ischemic Stroke/TIA Tissue Perfusion: Goal: Complications of ischemic stroke/TIA will be minimized Outcome: Progressing   

## 2021-04-06 NOTE — ED Notes (Signed)
Pt back to [facility]

## 2021-04-07 ENCOUNTER — Inpatient Hospital Stay (HOSPITAL_COMMUNITY): Payer: BC Managed Care – PPO

## 2021-04-07 DIAGNOSIS — I6389 Other cerebral infarction: Secondary | ICD-10-CM

## 2021-04-07 DIAGNOSIS — I639 Cerebral infarction, unspecified: Secondary | ICD-10-CM

## 2021-04-07 LAB — ECHOCARDIOGRAM COMPLETE
AR max vel: 1.63 cm2
AV Area VTI: 1.72 cm2
AV Area mean vel: 1.51 cm2
AV Mean grad: 6 mmHg
AV Peak grad: 10.5 mmHg
Ao pk vel: 1.62 m/s
Area-P 1/2: 4.54 cm2
S' Lateral: 3.4 cm

## 2021-04-07 LAB — GLUCOSE, CAPILLARY
Glucose-Capillary: 287 mg/dL — ABNORMAL HIGH (ref 70–99)
Glucose-Capillary: 312 mg/dL — ABNORMAL HIGH (ref 70–99)

## 2021-04-07 MED ORDER — CLOPIDOGREL BISULFATE 75 MG PO TABS: 75.0000 mg | ORAL_TABLET | Freq: Every day | ORAL | 0 refills | Status: AC

## 2021-04-07 MED ORDER — ASPIRIN 81 MG PO CHEW: 81.0000 mg | CHEWABLE_TABLET | Freq: Every day | ORAL | 0 refills | Status: AC

## 2021-04-07 NOTE — Plan of Care (Signed)
Problem: Education: Goal: Knowledge of General Education information will improve Description: Including pain rating scale, medication(s)/side effects and non-pharmacologic comfort measures 04/07/2021 1401 by Kathrynne Kulinski, [place]uit[place] Livings, RN Outcome: Adequate for Discharge 04/07/2021 0821 by Beryle Lathe, RN Outcome: Progressing   Problem: Health Behavior/Discharge Planning: Goal: Ability to manage health-related needs will improve 04/07/2021 1401 by Adelaide Pfefferkorn, [place] Livings, RN Outcome: Adequate for Discharge 04/07/2021 0821 by Beryle Lathe, RN Outcome: Progressing   Problem: Clinical Measurements: Goal: Ability to maintain clinical measurements within normal limits will improve 04/07/2021 1401 by Earland Reish, [place]man Livings, RN Outcome: Adequate for Discharge 04/07/2021 0821 by Beryle Lathe, RN Outcome: Progressing Goal: Will remain free from infection 04/07/2021 1401 by Sandrina Heaton, [place] Livings, RN Outcome: Adequate for Discharge 04/07/2021 0821 by Beryle Lathe, RN Outcome: Progressing Goal: Diagnostic test results will improve 04/07/2021 1401 by Lockie Bothun, [place] Livings, RN Outcome: Adequate for Discharge 04/07/2021 [id number] by Beryle Lathe, RN Outcome: Progressing Goal: Respiratory complications will improve 04/07/2021 1401 by Christifer Chapdelaine, [place] Livings, RN Outcome: Adequate for Discharge 04/07/2021 [id number] by Beryle Lathe, RN Outcome: Progressing Goal: Cardiovascular complication will be avoided 04/07/2021 1401 by Dare Spillman, [place]man Livings, RN Outcome: Adequate for Discharge 04/07/2021 0821 by Beryle Lathe, RN Outcome: Progressing   Problem: Activity: Goal: Risk for activity intolerance will decrease 04/07/2021 1401 by Maleeha Halls, [place]man Livings, RN Outcome: Adequate for Discharge 04/07/2021 0821 by Beryle Lathe, RN Outcome: Progressing   Problem: Nutrition: Goal: Adequate nutrition will be maintained 04/07/2021 1401 by Azhar Knope, [place]man Livings, RN Outcome: Adequate for Discharge 04/07/2021 0821 by Beryle Lathe, RN Outcome: Progressing   Problem: Coping: Goal: Level of anxiety will decrease 04/07/2021 1401 by Domingue Coltrain, [place]man Livings, RN Outcome: Adequate for Discharge 04/07/2021 0821 by Beryle Lathe, RN Outcome: Progressing   Problem: Elimination: Goal: Will not experience complications related to bowel motility 04/07/2021 1401 by Javanni Maring, [place]man Livings, RN Outcome: Adequate for Discharge 04/07/2021 0821 by Beryle Lathe, RN Outcome: Progressing Goal: Will not experience complications related to urinary retention 04/07/2021 1401 by Ragen Laver, [place]man Livings, RN Outcome: Adequate for Discharge 04/07/2021 0821 by Beryle Lathe, RN Outcome: Progressing   Problem: Pain Managment: Goal: General experience of comfort will improve 04/07/2021 1401 by Casin Federici, [place] Livings, RN Outcome: Adequate for Discharge 04/07/2021 0821 by Beryle Lathe, RN Outcome: Progressing   Problem: Safety: Goal: Ability to remain free from injury will improve 04/07/2021 1401 by Trisha Morandi, [place]man Livings, RN Outcome: Adequate for Discharge 04/07/2021 0821 by Beryle Lathe, RN Outcome: Progressing   Problem: Skin Integrity: Goal: Risk for impaired skin integrity will decrease 04/07/2021 1401 by Alison Breeding, [place]man Livings, RN Outcome: Adequate for Discharge 04/07/2021 0821 by Beryle Lathe, RN Outcome: Progressing   Problem: Education: Goal: Knowledge of disease or condition will improve 04/07/2021 1401 by Tzipporah Nagorski, [place]man Livings, RN Outcome: Adequate for Discharge 04/07/2021 0821 by Beryle Lathe, RN Outcome: Progressing Goal: Knowledge of secondary prevention will improve 04/07/2021 1401 by Tareek Sabo, [place] Livings, RN Outcome: Adequate for Discharge 04/07/2021 0017 by Beryle Lathe, RN Outcome: Progressing Goal: Knowledge of patient specific risk factors addressed and post discharge goals established will improve 04/07/2021 1401 by Chibuikem Thang, [place] Livings, RN Outcome: Adequate for Discharge 04/07/2021 [id number] by Beryle Lathe,  RN Outcome: Progressing Goal: Individualized Educational Video(s) 04/07/2021 1401 by Beryle Lathe, RN Outcome: Adequate for Discharge 04/07/2021 0821 by Beryle Lathe, RN Outcome: Progressing   Problem: Coping: Goal: Will verbalize positive feelings about self 04/07/2021 1401 by Lamonda Noxon,  [place]man Livings, RN Outcome: Adequate for Discharge 04/07/2021 4818 by Beryle Lathe, RN Outcome: Progressing   Problem: Health Behavior/Discharge Planning: Goal: Ability to manage health-related needs will improve 04/07/2021 1401 by Ryheem Jay, [place] Livings, RN Outcome: Adequate for Discharge 04/07/2021 0821 by Beryle Lathe, RN Outcome: Progressing   Problem: Self-Care: Goal: Ability to participate in self-care as condition permits will improve 04/07/2021 1401 by Brae Schaafsma, [place]man Livings, RN Outcome: Adequate for Discharge 04/07/2021 0821 by Beryle Lathe, RN Outcome: Progressing Goal: Verbalization of feelings and concerns over difficulty with self-care will improve 04/07/2021 1401 by Prateek Knipple, [place] Livings, RN Outcome: Adequate for Discharge 04/07/2021 0821 by Beryle Lathe, RN Outcome: Progressing Goal: Ability to communicate needs accurately will improve 04/07/2021 1401 by Raffael Bugarin, Q[place]man Livings, RN Outcome: Adequate for Discharge 04/07/2021 0821 by Beryle Lathe, RN Outcome: Progressing   Problem: Nutrition: Goal: Risk of aspiration will decrease 04/07/2021 1401 by Valentin Benney, [place]man Livings, RN Outcome: Adequate for Discharge 04/07/2021 0821 by Beryle Lathe, RN Outcome: Progressing Goal: Dietary intake will improve 04/07/2021 1401 by Yisel Megill, [place] Livings, RN Outcome: Adequate for Discharge 04/07/2021 0821 by Beryle Lathe, RN Outcome: Progressing   Problem: Ischemic Stroke/TIA Tissue Perfusion: Goal: Complications of ischemic stroke/TIA will be minimized 04/07/2021 1401 by Felina Tello, [place] Livings, RN Outcome: Adequate for Discharge 04/07/2021 0821 by Beryle Lathe, RN Outcome: Progressing

## 2021-04-07 NOTE — Plan of Care (Signed)
  Problem: Education: Goal: Knowledge of General Education information will improve Description: Including pain rating scale, medication(s)/side effects and non-pharmacologic comfort measures Outcome: Progressing   Problem: Health Behavior/Discharge Planning: Goal: Ability to manage health-related needs will improve Outcome: Progressing   Problem: Clinical Measurements: Goal: Ability to maintain clinical measurements within normal limits will improve Outcome: Progressing Goal: Will remain free from infection Outcome: Progressing Goal: Diagnostic test results will improve Outcome: Progressing Goal: Respiratory complications will improve Outcome: Progressing Goal: Cardiovascular complication will be avoided Outcome: Progressing   Problem: Activity: Goal: Risk for activity intolerance will decrease Outcome: Progressing   Problem: Nutrition: Goal: Adequate nutrition will be maintained Outcome: Progressing   Problem: Coping: Goal: Level of anxiety will decrease Outcome: Progressing   Problem: Elimination: Goal: Will not experience complications related to bowel motility Outcome: Progressing Goal: Will not experience complications related to urinary retention Outcome: Progressing   Problem: Pain Managment: Goal: General experience of comfort will improve Outcome: Progressing   Problem: Safety: Goal: Ability to remain free from injury will improve Outcome: Progressing   Problem: Skin Integrity: Goal: Risk for impaired skin integrity will decrease Outcome: Progressing   Problem: Education: Goal: Knowledge of disease or condition will improve Outcome: Progressing Goal: Knowledge of secondary prevention will improve Outcome: Progressing Goal: Knowledge of patient specific risk factors addressed and post discharge goals established will improve Outcome: Progressing Goal: Individualized Educational Video(s) Outcome: Progressing   Problem: Coping: Goal: Will verbalize  positive feelings about self Outcome: Progressing   Problem: Health Behavior/Discharge Planning: Goal: Ability to manage health-related needs will improve Outcome: Progressing   Problem: Self-Care: Goal: Ability to participate in self-care as condition permits will improve Outcome: Progressing Goal: Verbalization of feelings and concerns over difficulty with self-care will improve Outcome: Progressing Goal: Ability to communicate needs accurately will improve Outcome: Progressing   Problem: Nutrition: Goal: Risk of aspiration will decrease Outcome: Progressing Goal: Dietary intake will improve Outcome: Progressing   Problem: Ischemic Stroke/TIA Tissue Perfusion: Goal: Complications of ischemic stroke/TIA will be minimized Outcome: Progressing   

## 2021-04-07 NOTE — Discharge Instructions (Signed)
Barbara Jacobs,  You are in the hospital because of difficulty with her speech and was found to have a new stroke.  He was seen by the neurologist who has completed your initial stroke work-up and has recommended that you start aspirin and Plavix.  You will continue Plavix for a total of 21 days and aspirin indefinitely.  You will need to follow-up with the occupational therapist and speech therapist as an outpatient; referrals have been sent.  As we discussed it is fine for you to stay out of work for the next week.  I discussed this with the neurologist who recommends that you follow-up with your outpatient speech therapist to determine if you will need to stay out of work for a longer period of time as you recover.  Please do not restart your metformin until 9/5 at the earliest secondary to receiving IV contrast for a CT scan.  [name], MD

## 2021-04-07 NOTE — Discharge Summary (Signed)
Physician Discharge Summary  [name] ZOX:[id number] DOB: 1971-07-08 DOA: 04/05/2021  PCP: Verlon Au, MD  Admit date: 04/05/2021 Discharge date: 04/07/2021  Admitted From: Home Disposition: Home  Recommendations for Outpatient Follow-up:  Follow up with PCP in 1 week Follow up with neurology in 1 month Interventional neuroradiology referral for 3mm aneurysm Please follow up on the following pending results: None  Home Health: None. Outpatient OT and speech therapy Equipment/Devices: None  Discharge Condition: Stable CODE STATUS: Full code Diet recommendation: Carb modified   Brief/Interim Summary:  Admission HPI written by Hillary Bow, DO   HPI: Barbara Jacobs is a 50 y.o. female with medical history significant of CAD, HTN, HLD, DM2.   Pt presents to ED with 48h history of difficulty with speech, described as difficulty with word finding.  Symptoms are persistent.  No focal numbness nor weakness.  Nothing makes symptoms better or worse.   No fevers, chills, CP, SOB.  Hospital course:  Acute ischemic stroke Associated aphasia. MRI brain significant for a left basal ganglia infarct. Neurology consulted. CTA head/neck without severe stenosis. LDL of 78. Hemoglobin A1C of 8.6%. Transthoracic Echocardiogram with bubble study with no etiology for embolism. Transcranial dopplers with bubble study significant for no PFO.Speech therapy and occupational therapy recommending outpatient speech therapy and occupational therapy respectively. Neurology recommending to continue Lipitor 80 mg daily and to start aspirin indefinitely and Plavix to complete a 21 day course. Neurology also recommending a 30 day event monitor; referral sent to cardiology.  Primary hypertension Patient is on lisinopril, Coreg as an outpatient. Uncontrolled in setting of permissive hypertension secondary to above. Resume home regimen on discharge.   Diabetes mellitus, type 2 Patient  is on Trulicity, metformin and glipizide as an outpatient. Hemoglobin a1C of 8.6%. Continue home regimen.   Hyperlipidemia LDL of 78. Currently on high dose statin therapy with Lipitor 80 mg daily as an outpatient. 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 Will need follow-up.   Insomnia Continue home Ambien  Morbid obesity Body mass index is 41.9 kg/m.  Discharge Diagnoses:  Principal Problem:   Acute ischemic stroke [facility]) Active Problems:   Hyperlipidemia   HTN (hypertension)   Poorly controlled type 2 diabetes mellitus with circulatory disorder [facility])    Discharge Instructions  Discharge Instructions     Ambulatory referral to Neurology   Complete by: As directed    Follow up with stroke clinic NP (Jessica Vanschaick or Darrol Angel, if both not available, consider Manson Allan, or Ahern) at [facility] in about 4 weeks. Thanks.   Ambulatory referral to Occupational Therapy   Complete by: As directed    Ambulatory referral to Speech Therapy   Complete by: As directed       Allergies as of 04/07/2021       Reactions   Ibuprofen Swelling   Throat swells shut   Naproxen    Swell up and throat swells shut   Pineapple    Shellfish Allergy Hives, Itching, Swelling        Medication List     TAKE these medications    ALPRAZolam 1 MG tablet Commonly known as: XANAX Take 1 mg by mouth daily as needed for anxiety.   aspirin 81 MG chewable tablet Chew 1 tablet (81 mg total) by mouth daily. Start taking on: April 08, 2021   atorvastatin 80 MG tablet Commonly known as: LIPITOR Take 1 tablet (80 mg total)  by mouth daily at 6 PM.   carvedilol 6.25 MG tablet Commonly known as: COREG Take 1 tablet (6.25 mg total) by mouth 2 (two) times daily with a meal.   clopidogrel 75 MG tablet Commonly known as: PLAVIX Take 1 tablet (75 mg total) by mouth daily for 19 days. Start taking on: April 08, 2021   glipiZIDE 5  MG tablet Commonly known as: GLUCOTROL TAKE ONE TABLET BY MOUTH TWICE DAILY BEFORE A MEAL What changed: See the new instructions.   Insulin Pen Needle 32G X 4 MM Misc Commonly known as: Fifty50 Pen Needles Use 1x a day   lisinopril 40 MG tablet Commonly known as: ZESTRIL Take 1 tablet (40 mg total) by mouth daily.   metFORMIN 1000 MG tablet Commonly known as: GLUCOPHAGE TAKE ONE TABLET BY MOUTH TWICE DAILY WITH A MEAL   multivitamin with minerals tablet Take 1 tablet by mouth daily.   sertraline 50 MG tablet Commonly known as: ZOLOFT Take 150 mg by mouth daily.   Trulicity 3 MG/0.5ML Sopn Generic drug: Dulaglutide Inject 3 mg into the skin once a week. Mondays   zolpidem 10 MG tablet Commonly known as: AMBIEN Take 1 tablet (10 mg total) by mouth at bedtime as needed. for sleep What changed: reasons to take this        Follow-up Information     [facility] Follow [facility].   Specialty: Rehabilitation Why: CALL TUESDAY to schedule your appointment. A referral has been placed electronically for you. Contact information: [address] [phone] mc [place] [address] [phone]        [facility]. Schedule an appointment as soon as possible for a visit in 1 month(s).   Specialty: Neurology Why: stroke clinic Contact information: [address] [place] [address] [phone]        Verlon Au, MD. Schedule an appointment as soon as possible for a visit in 1 week(s).   Specialty: Family Medicine Why: For hospital follow-up Contact information: [address] [place] [address] [phone]                Allergies  Allergen Reactions   Ibuprofen Swelling    Throat swells shut   Naproxen     Swell up and throat swells shut   Pineapple    Shellfish Allergy Hives, Itching and Swelling     Consultations: Neurology   Procedures/Studies: CT ANGIO NECK W OR WO CONTRAST  Result Date: 04/06/2021 CLINICAL DATA:  Stroke workup. EXAM: CT ANGIOGRAPHY NECK TECHNIQUE: Multidetector CT imaging of the neck was performed using the standard protocol during bolus administration of intravenous contrast. Multiplanar CT image reconstructions and MIPs were obtained to evaluate the vascular anatomy. Carotid stenosis measurements (when applicable) are obtained utilizing NASCET criteria, using the distal internal carotid diameter as the denominator. CONTRAST:  56mL OMNIPAQUE IOHEXOL 350 MG/ML SOLN COMPARISON:  MRI and MRA from earlier the same day FINDINGS: Aortic arch: Atheromatous plaque. Right carotid system: Atheromatous plaque with mild-to-moderate irregularity at the proximal ICA. Stenosis measures up to 30%. No ulceration or dissection. Left carotid system: Calcified plaque at the bifurcation and proximal bulb without flow limiting stenosis. Mild plaque heterogeneity. Vertebral arteries: Proximal subclavian atherosclerosis. Calcified plaque at the right V4 segment without flow limiting stenosis. Skeleton: Ordinary cervical spine degeneration. Other neck: Negative Upper chest: Negative IMPRESSION: Cervical carotid bifurcation atherosclerosis with greater heterogeneity of plaque on the right. No flow limiting stenosis. Electronically  Signed   By: Marnee Spring M.D.   On: 04/06/2021 11:53   MR ANGIO HEAD WO CONTRAST  Result Date: 04/06/2021 CLINICAL DATA:  Stroke follow-up EXAM: MRA HEAD WITHOUT CONTRAST TECHNIQUE: Angiographic images of the Circle of Willis were acquired using MRA technique without intravenous contrast. COMPARISON:  No pertinent prior exam. FINDINGS: POSTERIOR CIRCULATION: --Vertebral arteries: Normal --Inferior cerebellar arteries: Normal. --Basilar artery: Normal. --Superior cerebellar arteries: Normal. --Posterior cerebral arteries: Normal. ANTERIOR CIRCULATION: --Intracranial  internal carotid arteries: There is an inferiorly projecting aneurysm arising from the left carotid terminus that measures 3 mm. --Anterior cerebral arteries (ACA): Normal. --Middle cerebral arteries (MCA): Normal. ANATOMIC VARIANTS: Fetal origin of the right PCA. IMPRESSION: 1. No emergent large vessel occlusion or high-grade stenosis. 2. 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 Electronically Signed   By: Deatra Robinson M.D.   On: 04/06/2021 02:41   MR ANGIO NECK W WO CONTRAST  Result Date: 04/06/2021 CLINICAL DATA:  Stroke follow-up EXAM: MRA NECK WITHOUT AND WITH CONTRAST TECHNIQUE: Multiplanar and multiecho pulse sequences of the neck were obtained without and with intravenous contrast. Angiographic images of the neck were obtained using MRA technique without and with intravenous contrast. CONTRAST:  10mL GADAVIST GADOBUTROL 1 MMOL/ML IV SOLN COMPARISON:  None. FINDINGS: Three-vessel branching pattern the aortic arch. Mild atherosclerotic plaque within the proximal right internal carotid artery. No hemodynamically significant stenosis Normal left carotid system. Vertebral arteries are normal. IMPRESSION: No hemodynamically significant stenosis of the carotid or vertebral arteries. Electronically Signed   By: Deatra Robinson M.D.   On: 04/06/2021 02:56   MR BRAIN WO CONTRAST  Result Date: 04/05/2021 CLINICAL DATA:  Acute neurologic deficit EXAM: MRI HEAD WITHOUT CONTRAST TECHNIQUE: Multiplanar, multiecho pulse sequences of the brain and surrounding structures were obtained without intravenous contrast. COMPARISON:  None. FINDINGS: Brain: Intermediate sized area of acute ischemia in the left basal ganglia. No acute or chronic hemorrhage. Normal white matter signal, parenchymal volume and CSF spaces. The midline structures are normal. Vascular: Major flow voids are preserved. Skull and upper cervical spine: Normal calvarium and skull base. Visualized upper cervical spine and  soft tissues are normal. Sinuses/Orbits:No paranasal sinus fluid levels or advanced mucosal thickening. No mastoid or middle ear effusion. Normal orbits. Other: None. IMPRESSION: Intermediate sized area of acute ischemia in the left basal ganglia. No hemorrhage or mass effect. Electronically Signed   By: Deatra Robinson M.D.   On: 04/05/2021 22:17   VAS [place] TRANSCRANIAL DOPPLER W BUBBLES  Result Date: 04/07/2021  Transcranial Doppler with Bubble Patient Name:  Barbara Jacobs  Date of Exam:   04/07/2021 Medical Rec #: [id number]            Accession #:    [id number] Date of Birth: 05-11-1971            Patient Gender: F Patient Age:   19 years Exam Location:  [facility] Procedure:      VAS [place] TRANSCRANIAL DOPPLER W BUBBLES Referring Phys: Scheryl Marten XU --------------------------------------------------------------------------------  Indications: Stroke. History: Large left basal ganglia stroke, hypertension, hyperlipidemia. Comparison Study: No prior study Performing Technologist: Sherren Kerns RVS  Examination Guidelines: A complete evaluation includes B-mode imaging, spectral Doppler, color Doppler, and power Doppler as needed of all accessible portions of each vessel. Bilateral testing is considered an integral part of a complete examination. Limited examinations for reoccurring indications may be performed as noted.  Summary: This was a normal transcranial Doppler study, with normal flow  direction and velocity of all identified vessels of the anterior and posterior circulations, with no evidence of stenosis, vasospasm or occlusion. There was no evidence of intracranial disease. *See table(s) above for TCD measurements and observations.    Preliminary    ECHOCARDIOGRAM COMPLETE  Result Date: 04/07/2021    ECHOCARDIOGRAM REPORT   Patient Name:   Barbara Jacobs Date of Exam: 04/07/2021 Medical Rec #:  [id number]           Height:       65.0 in Accession #:    [id number]          Weight:       251.8 lb  Date of Birth:  1971-05-02           BSA:          2.181 m Patient Age:    50 years            BP:           97/78 mmHg Patient Gender: F                   HR:           72 bpm. Exam Location:  Inpatient Procedure: 2D Echo, Color Doppler and Cardiac Doppler Indications:    Stroke 434.91  History:        Patient has no prior history of Echocardiogram examinations.                 Risk Factors:Obesity.  Sonographer:    Roosvelt Maser RDCS Referring Phys: [phone] Starlina Lapre A Lam Mccubbins IMPRESSIONS  1. Left ventricular ejection fraction, by estimation, is 60 to 65%. The left ventricle has normal function. The left ventricle has no regional wall motion abnormalities. There is mild concentric left ventricular hypertrophy. Left ventricular diastolic parameters are consistent with Grade II diastolic dysfunction (pseudonormalization).  2. Right ventricular systolic function is normal. The right ventricular size is normal.  3. Left atrial size was mildly dilated.  4. The mitral valve is normal in structure. Trivial mitral valve regurgitation. No evidence of mitral stenosis.  5. There is moderate leaftet tip thickness/calcification. Consider TEE for further clarification in the setting of stroke. The aortic valve is tricuspid. There is moderate calcification of the aortic valve. There is moderate thickening of the aortic valve. Aortic valve regurgitation is not visualized. No aortic stenosis is present.  6. The inferior vena cava is normal in size with greater than 50% respiratory variability, suggesting right atrial pressure of 3 mmHg. Conclusion(s)/Recommendation(s): No intracardiac source of embolism detected on this transthoracic study. A transesophageal echocardiogram is recommended to exclude cardiac source of embolism if clinically indicated. FINDINGS  Left Ventricle: Left ventricular ejection fraction, by estimation, is 60 to 65%. The left ventricle has normal function. The left ventricle has no regional wall motion abnormalities.  The left ventricular internal cavity size was normal in size. There is  mild concentric left ventricular hypertrophy. Left ventricular diastolic parameters are consistent with Grade II diastolic dysfunction (pseudonormalization). Right Ventricle: The right ventricular size is normal. No increase in right ventricular wall thickness. Right ventricular systolic function is normal. Left Atrium: Left atrial size was mildly dilated. Right Atrium: Right atrial size was normal in size. Pericardium: There is no evidence of pericardial effusion. Mitral Valve: The mitral valve is normal in structure. Trivial mitral valve regurgitation. No evidence of mitral valve stenosis. Tricuspid Valve: The tricuspid valve is normal in structure. Tricuspid valve regurgitation is not demonstrated.  No evidence of tricuspid stenosis. Aortic Valve: There is moderate leaftet tip thickness/calcification. Consider TEE for further clarification in the setting of stroke. The aortic valve is tricuspid. There is moderate calcification of the aortic valve. There is moderate thickening of the aortic valve. Aortic valve regurgitation is not visualized. No aortic stenosis is present. Aortic valve mean gradient measures 6.0 mmHg. Aortic valve peak gradient measures 10.5 mmHg. Aortic valve area, by VTI measures 1.72 cm. Pulmonic Valve: The pulmonic valve was normal in structure. Pulmonic valve regurgitation is not visualized. No evidence of pulmonic stenosis. Aorta: The aortic root is normal in size and structure. Venous: The inferior vena cava is normal in size with greater than 50% respiratory variability, suggesting right atrial pressure of 3 mmHg. IAS/Shunts: No atrial level shunt detected by color flow Doppler.  LEFT VENTRICLE PLAX 2D LVIDd:         5.00 cm  Diastology LVIDs:         3.40 cm  LV e' medial:    4.90 cm/s LV PW:         1.20 cm  LV E/e' medial:  24.7 LV IVS:        1.20 cm  LV e' lateral:   6.31 cm/s LVOT diam:     1.90 cm  LV E/e'  lateral: 19.2 LV SV:         58 LV SV Index:   27 LVOT Area:     2.84 cm  RIGHT VENTRICLE RV Basal diam:  2.70 cm LEFT ATRIUM             Index       RIGHT ATRIUM           Index LA diam:        4.20 cm 1.93 cm/m  RA Area:     10.80 cm LA Vol (A2C):   75.6 ml 34.66 ml/m RA Volume:   23.60 ml  10.82 ml/m LA Vol (A4C):   34.3 ml 15.72 ml/m LA Biplane Vol: 52.9 ml 24.25 ml/m  AORTIC VALVE AV Area (Vmax):    1.63 cm AV Area (Vmean):   1.51 cm AV Area (VTI):     1.72 cm AV Vmax:           162.00 cm/s AV Vmean:          120.000 cm/s AV VTI:            0.336 m AV Peak Grad:      10.5 mmHg AV Mean Grad:      6.0 mmHg LVOT Vmax:         93.30 cm/s LVOT Vmean:        63.900 cm/s LVOT VTI:          0.204 m LVOT/AV VTI ratio: 0.61  AORTA Ao Root diam: 2.90 cm MITRAL VALVE MV Area (PHT): 4.54 cm     SHUNTS MV Decel Time: 167 msec     Systemic VTI:  0.20 m MV E velocity: 121.00 cm/s  Systemic Diam: 1.90 cm MV A velocity: 99.00 cm/s MV E/A ratio:  1.22 Donato Schultz MD Electronically signed by Donato Schultz MD Signature Date/Time: 04/07/2021/11:58:07 AM    Final    VAS [place] LOWER EXTREMITY VENOUS (DVT)  Result Date: 04/06/2021  Lower Venous DVT Study Patient Name:  Barbara Jacobs  Date of Exam:   04/06/2021 Medical Rec #: [id number]            Accession #:  [id number] Date of Birth: 07/08/1971            Patient Gender: F Patient Age:   17 years Exam Location:  [facility] Procedure:      VAS [place] LOWER EXTREMITY VENOUS (DVT) Referring Phys: Scheryl Marten XU --------------------------------------------------------------------------------  Indications: Stroke.  Comparison Study: No prior study on file Performing Technologist: Sherren Kerns RVS  Examination Guidelines: A complete evaluation includes B-mode imaging, spectral Doppler, color Doppler, and power Doppler as needed of all accessible portions of each vessel. Bilateral testing is considered an integral part of a complete examination. Limited examinations for  reoccurring indications may be performed as noted. The reflux portion of the exam is performed with the patient in reverse Trendelenburg.  +---------+---------------+---------+-----------+----------+--------------+ RIGHT    CompressibilityPhasicitySpontaneityPropertiesThrombus Aging +---------+---------------+---------+-----------+----------+--------------+ CFV      Full           Yes      Yes                                 +---------+---------------+---------+-----------+----------+--------------+ SFJ      Full                                                        +---------+---------------+---------+-----------+----------+--------------+ FV Prox  Full                                                        +---------+---------------+---------+-----------+----------+--------------+ FV Mid   Full                                                        +---------+---------------+---------+-----------+----------+--------------+ FV DistalFull                                                        +---------+---------------+---------+-----------+----------+--------------+ PFV      Full                                                        +---------+---------------+---------+-----------+----------+--------------+ POP      Full           Yes      Yes                                 +---------+---------------+---------+-----------+----------+--------------+ PTV      Full                                                        +---------+---------------+---------+-----------+----------+--------------+  PERO     Full                                                        +---------+---------------+---------+-----------+----------+--------------+   +---------+---------------+---------+-----------+----------+--------------+ LEFT     CompressibilityPhasicitySpontaneityPropertiesThrombus Aging  +---------+---------------+---------+-----------+----------+--------------+ CFV      Full           Yes      Yes                                 +---------+---------------+---------+-----------+----------+--------------+ SFJ      Full                                                        +---------+---------------+---------+-----------+----------+--------------+ FV Prox  Full                                                        +---------+---------------+---------+-----------+----------+--------------+ FV Mid   Full                                                        +---------+---------------+---------+-----------+----------+--------------+ FV DistalFull                                                        +---------+---------------+---------+-----------+----------+--------------+ PFV      Full                                                        +---------+---------------+---------+-----------+----------+--------------+ POP      Full           Yes      Yes                                 +---------+---------------+---------+-----------+----------+--------------+ PTV      Full                                                        +---------+---------------+---------+-----------+----------+--------------+ PERO     Full                                                        +---------+---------------+---------+-----------+----------+--------------+  Summary: BILATERAL: - No evidence of deep vein thrombosis seen in the lower extremities, bilaterally. -No evidence of popliteal cyst, bilaterally.   *See table(s) above for measurements and observations. Electronically signed by Waverly Ferrari MD on 04/06/2021 at 12:00:45 PM.    Final       Subjective: No issues this morning.  Discharge Exam: Vitals:   04/07/21 0858 04/07/21 1201  BP: 97/78 (!) 171/94  Pulse: 87 75  Resp: 20 18  Temp: 98.5 F (36.9 C) 98.2 F (36.8 C)  SpO2:  97% 98%   Vitals:   04/06/21 2329 04/07/21 0429 04/07/21 0858 04/07/21 1201  BP: (!) 166/102 (!) 154/88 97/78 (!) 171/94  Pulse: 74 76 87 75  Resp: 18 18 20 18   Temp: 97.7 F (36.5 C) 98.1 F (36.7 C) 98.5 F (36.9 C) 98.2 F (36.8 C)  TempSrc: Oral Oral Oral Oral  SpO2: 99% 97% 97% 98%  Weight:      Height:        General: Pt is alert, awake, not in acute distress Cardiovascular: RRR, S1/S2 +, no rubs, no gallops Respiratory: CTA bilaterally, no wheezing, no rhonchi Abdominal: Soft, NT, ND, bowel sounds + Extremities: no edema, no cyanosis    The results of significant diagnostics from this hospitalization (including imaging, microbiology, ancillary and laboratory) are listed below for reference.     Microbiology: Recent Results (from the past 240 hour(s))  Resp Panel by RT-PCR (Flu A&B, Covid) Nasopharyngeal Swab     Status: None   Collection Time: 04/05/21  3:35 PM   Specimen: Nasopharyngeal Swab; Nasopharyngeal(NP) swabs in vial transport medium  Result Value Ref Range Status   SARS Coronavirus 2 by RT PCR NEGATIVE NEGATIVE Final    Comment: (NOTE) SARS-CoV-2 target nucleic acids are NOT DETECTED.  The SARS-CoV-2 RNA is generally detectable in upper respiratory specimens during the acute phase of infection. The lowest concentration of SARS-CoV-2 viral copies this assay can detect is 138 copies/mL. A negative result does not preclude SARS-Cov-2 infection and should not be used as the sole basis for treatment or other patient management decisions. A negative result may occur with  improper specimen collection/handling, submission of specimen other than nasopharyngeal swab, presence of viral mutation(s) within the areas targeted by this assay, and inadequate number of viral copies(<138 copies/mL). A negative result must be combined with clinical observations, patient history, and epidemiological information. The expected result is Negative.  Fact Sheet for  Patients:  06/05/21  Fact Sheet for Healthcare Providers:  BloggerCourse.com  This test is no t yet approved or cleared by the [employer] FDA and  has been authorized for detection and/or diagnosis of SARS-CoV-2 by FDA under an Emergency Use Authorization (EUA). This EUA will remain  in effect (meaning this test can be used) for the duration of the COVID-19 declaration under Section 564(b)(1) of the Act, 21 U.S.C.section 360bbb-3(b)(1), unless the authorization is terminated  or revoked sooner.       Influenza A by PCR NEGATIVE NEGATIVE Final   Influenza B by PCR NEGATIVE NEGATIVE Final    Comment: (NOTE) The Xpert Xpress SARS-CoV-2/FLU/RSV plus assay is intended as an aid in the diagnosis of influenza from Nasopharyngeal swab specimens and should not be used as a sole basis for treatment. Nasal washings and aspirates are unacceptable for Xpert Xpress SARS-CoV-2/FLU/RSV testing.  Fact Sheet for Patients: [place]  Fact Sheet for Healthcare Providers: BloggerCourse.com  This test is not yet approved or cleared by the [employer]ious[place]ker.[place] FDA  and has been authorized for detection and/or diagnosis of SARS-CoV-2 by FDA under an Emergency Use Authorization (EUA). This EUA will remain in effect (meaning this test can be used) for the duration of the COVID-19 declaration under Section 564(b)(1) of the Act, 21 U.S.C. section 360bbb-3(b)(1), unless the authorization is terminated or revoked.  Performed at [facility], [address], [place], [place] [address]      Labs: BNP (last 3 results) No results for input(s): BNP in the last 8760 hours. Basic Metabolic Panel: Recent Labs  Lab 04/05/21 1535 04/05/21 1623  NA 135 136  K 4.7 4.7  CL 105 106  CO2 22  --   GLUCOSE 210* 206*  BUN 21* 25*  CREATININE 1.05* 1.00  CALCIUM 9.6  --    Liver Function  Tests: Recent Labs  Lab 04/05/21 1535  AST 17  ALT 11  ALKPHOS 60  BILITOT 0.7  PROT 7.3  ALBUMIN 3.7   No results for input(s): LIPASE, AMYLASE in the last 168 hours. No results for input(s): AMMONIA in the last 168 hours. CBC: Recent Labs  Lab 04/05/21 1535 04/05/21 1623  WBC 11.0*  --   NEUTROABS 7.2  --   HGB 11.5* 12.2  HCT 36.3 36.0  MCV 92.8  --   PLT 384  --    Cardiac Enzymes: No results for input(s): CKTOTAL, CKMB, CKMBINDEX, TROPONINI in the last 168 hours. BNP: Invalid input(s): POCBNP CBG: Recent Labs  Lab 04/06/21 1206 04/06/21 1619 04/06/21 2024 04/07/21 0615 04/07/21 1207  GLUCAP 337* 328* 247* 287* 312*   D-Dimer No results for input(s): DDIMER in the last 72 hours. Hgb A1c Recent Labs    04/06/21 0437  HGBA1C 8.6*   Lipid Profile Recent Labs    04/06/21 0437  CHOL 179  HDL 39*  LDLCALC 78  TRIG 841*  CHOLHDL 4.6   Thyroid function studies No results for input(s): TSH, T4TOTAL, T3FREE, THYROIDAB in the last 72 hours.  Invalid input(s): FREET3 Anemia work up No results for input(s): VITAMINB12, FOLATE, FERRITIN, TIBC, IRON, RETICCTPCT in the last 72 hours. Urinalysis    Component Value Date/Time   COLORURINE YELLOW 04/05/2021 2109   APPEARANCEUR CLOUDY (A) 04/05/2021 2109   LABSPEC 1.023 04/05/2021 2109   PHURINE 5.0 04/05/2021 2109   GLUCOSEU NEGATIVE 04/05/2021 2109   HGBUR NEGATIVE 04/05/2021 2109   BILIRUBINUR NEGATIVE 04/05/2021 2109   KETONESUR NEGATIVE 04/05/2021 2109   PROTEINUR 100 (A) 04/05/2021 2109   NITRITE NEGATIVE 04/05/2021 2109   LEUKOCYTESUR NEGATIVE 04/05/2021 2109   Sepsis Labs Invalid input(s): PROCALCITONIN,  WBC,  LACTICIDVEN Microbiology Recent Results (from the past 240 hour(s))  Resp Panel by RT-PCR (Flu A&B, Covid) Nasopharyngeal Swab     Status: None   Collection Time: 04/05/21  3:35 PM   Specimen: Nasopharyngeal Swab; Nasopharyngeal(NP) swabs in vial transport medium  Result Value Ref  Range Status   SARS Coronavirus 2 by RT PCR NEGATIVE NEGATIVE Final    Comment: (NOTE) SARS-CoV-2 target nucleic acids are NOT DETECTED.  The SARS-CoV-2 RNA is generally detectable in upper respiratory specimens during the acute phase of infection. The lowest concentration of SARS-CoV-2 viral copies this assay can detect is 138 copies/mL. A negative result does not preclude SARS-Cov-2 infection and should not be used as the sole basis for treatment or other patient management decisions. A negative result may occur with  improper specimen collection/handling, submission of specimen other than nasopharyngeal swab, presence of viral mutation(s) within the  areas targeted by this assay, and inadequate number of viral copies(<138 copies/mL). A negative result must be combined with clinical observations, patient history, and epidemiological information. The expected result is Negative.  Fact Sheet for Patients:  BloggerCourse.comhttps://www.fda.gov/media/152166/download  Fact Sheet for Healthcare Providers:  SeriousBroker.ithttps://www.fda.gov/media/152162/download  This test is no t yet approved or cleared by the [place] FDA and  has been authorized for detection and/or diagnosis of SARS-CoV-2 by FDA under an Emergency Use Authorization (EUA). This EUA will remain  in effect (meaning this test can be used) for the duration of the COVID-19 declaration under Section 564(b)(1) of the Act, 21 U.S.C.section 360bbb-3(b)(1), unless the authorization is terminated  or revoked sooner.       Influenza A by PCR NEGATIVE NEGATIVE Final   Influenza B by PCR NEGATIVE NEGATIVE Final    Comment: (NOTE) The Xpert Xpress SARS-CoV-2/FLU/RSV plus assay is intended as an aid in the diagnosis of influenza from Nasopharyngeal swab specimens and should not be used as a sole basis for treatment. Nasal washings and aspirates are unacceptable for Xpert Xpress SARS-CoV-2/FLU/RSV testing.  Fact Sheet for  Patients: BloggerCourse.comhttps://www.fda.gov/media/152166/download  Fact Sheet for Healthcare Providers: SeriousBroker.ithttps://www.fda.gov/media/152162/download  This test is not yet approved or cleared by the [place] FDA and has been authorized for detection and/or diagnosis of SARS-CoV-2 by FDA under an Emergency Use Authorization (EUA). This EUA will remain in effect (meaning this test can be used) for the duration of the COVID-19 declaration under Section 564(b)(1) of the Act, 21 U.S.C. section 360bbb-3(b)(1), unless the authorization is terminated or revoked.  Performed at [facility], [address] [place], [place] [id number]      Time coordinating discharge: 35 minutes  SIGNED:   Jacquelin Hawkingalph Jahleel Stroschein, MD [facility] 04/07/2021, 2:37 PM

## 2021-04-07 NOTE — Progress Notes (Signed)
Inpatient Diabetes Program Recommendations  AACE/ADA: New Consensus Statement on Inpatient Glycemic Control (2015)  Target Ranges:  Prepandial:   less than 140 mg/dL      Peak postprandial:   less than 180 mg/dL (1-2 hours)      Critically ill patients:  140 - 180 mg/dL   Lab Results  Component Value Date   GLUCAP 287 (H) 04/07/2021   HGBA1C 8.6 (H) 04/06/2021    Review of Glycemic Control  Diabetes history: DM 2 Outpatient Diabetes medications: Glipizide 5 mg bid, Metformin 1000 mg bid, trulicity 3 mg Mondays Current orders for Inpatient glycemic control:  Novolog 0-20 tid and hs  Inpatient Diabetes Program Recommendations:    - Add Semglee 15 units (0.1 units/kg), pt to need titration of home meds if lifestyle does not need modification. Close PCP follow up.  A1c sub optimal at 8.6% A1c.  Will try to call pt this admission if time allows.  Thanks,  Christena Deem RN, MSN, BC-ADM Inpatient Diabetes Coordinator Team Pager [phone] (8a-5p)

## 2021-04-07 NOTE — Plan of Care (Signed)
  Problem: Education: Goal: Knowledge of General Education information will improve Description: Including pain rating scale, medication(s)/side effects and non-pharmacologic comfort measures Outcome: Progressing   Problem: Clinical Measurements: Goal: Ability to maintain clinical measurements within normal limits will improve Outcome: Progressing   Problem: Activity: Goal: Risk for activity intolerance will decrease Outcome: Progressing   Problem: Coping: Goal: Level of anxiety will decrease Outcome: Progressing   Problem: Education: Goal: Knowledge of disease or condition will improve Outcome: Progressing   Problem: Health Behavior/Discharge Planning: Goal: Ability to manage health-related needs will improve Outcome: Progressing   Problem: Self-Care: Goal: Verbalization of feelings and concerns over difficulty with self-care will improve Outcome: Progressing   Problem: Ischemic Stroke/TIA Tissue Perfusion: Goal: Complications of ischemic stroke/TIA will be minimized Outcome: Progressing

## 2021-04-07 NOTE — Progress Notes (Signed)
VASCULAR LAB    TCD with bubbles has been performed.  See CV proc for preliminary results.   [name], RVT 04/07/2021, 1:29 PM

## 2021-04-07 NOTE — Progress Notes (Signed)
  Echocardiogram 2D Echocardiogram has been performed.  Roosvelt Maser F 04/07/2021, 11:39 AM

## 2021-04-07 NOTE — Progress Notes (Addendum)
STROKE TEAM PROGRESS NOTE   INTERVAL HISTORY TCD tech is at the bedside.  We did TCD bubble study and there is negative for PFO. Pt neuro stable, PT/OT recommend outpt PT/OT. She is eager to go home.   OBJECTIVE Vitals:   04/06/21 2329 04/07/21 0429 04/07/21 0858 04/07/21 1201  BP: (!) 166/102 (!) 154/88 97/78 (!) 171/94  Pulse: 74 76 87 75  Resp: 18 18 20 18   Temp: 97.7 F (36.5 C) 98.1 F (36.7 C) 98.5 F (36.9 C) 98.2 F (36.8 C)  TempSrc: Oral Oral Oral Oral  SpO2: 99% 97% 97% 98%  Weight:      Height:        CBC:  Recent Labs  Lab 04/05/21 1535 04/05/21 1623  WBC 11.0*  --   NEUTROABS 7.2  --   HGB 11.5* 12.2  HCT 36.3 36.0  MCV 92.8  --   PLT 384  --      Basic Metabolic Panel:  Recent Labs  Lab 04/05/21 1535 04/05/21 1623  NA 135 136  K 4.7 4.7  CL 105 106  CO2 22  --   GLUCOSE 210* 206*  BUN 21* 25*  CREATININE 1.05* 1.00  CALCIUM 9.6  --      Lipid Panel:     Component Value Date/Time   CHOL 179 04/06/2021 0437   TRIG 308 (H) 04/06/2021 0437   HDL 39 (L) 04/06/2021 0437   CHOLHDL 4.6 04/06/2021 0437   VLDL 62 (H) 04/06/2021 0437   LDLCALC 78 04/06/2021 0437   HgbA1c:  Lab Results  Component Value Date   HGBA1C 8.6 (H) 04/06/2021   Urine Drug Screen:     Component Value Date/Time   LABOPIA NONE DETECTED 04/05/2021 2110   COCAINSCRNUR NONE DETECTED 04/05/2021 2110   LABBENZ NONE DETECTED 04/05/2021 2110   AMPHETMU NONE DETECTED 04/05/2021 2110   THCU NONE DETECTED 04/05/2021 2110   LABBARB NONE DETECTED 04/05/2021 2110    Alcohol Level     Component Value Date/Time   ETH <10 04/05/2021 1535    IMAGING  MR ANGIO HEAD WO CONTRAST Result Date: 04/06/2021 CLINICAL DATA:  Stroke follow-up EXAM: MRA HEAD WITHOUT CONTRAST TECHNIQUE: Angiographic images of the Circle of Willis were acquired using MRA technique without intravenous contrast. COMPARISON:  No pertinent prior exam. FINDINGS: POSTERIOR CIRCULATION: --Vertebral arteries:  Normal --Inferior cerebellar arteries: Normal. --Basilar artery: Normal. --Superior cerebellar arteries: Normal. --Posterior cerebral arteries: Normal. 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 --Anterior cerebral arteries (ACA): Normal. --Middle cerebral arteries (MCA): Normal. ANATOMIC VARIANTS: Fetal origin of the right PCA.  IMPRESSION:  1. No emergent large vessel occlusion or high-grade stenosis.  2. 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  Electronically Signed   By: 06/06/2021 M.D.   On: 04/06/2021 02:41   MR ANGIO NECK W WO CONTRAST Result Date: 04/06/2021 CLINICAL DATA:  Stroke follow-up EXAM: MRA NECK WITHOUT AND WITH CONTRAST TECHNIQUE: Multiplanar and multiecho pulse sequences of the neck were obtained without and with intravenous contrast. Angiographic images of the neck were obtained using MRA technique without and with intravenous contrast. CONTRAST:  63mL GADAVIST GADOBUTROL 1 MMOL/ML IV SOLN COMPARISON:  None. FINDINGS: Three-vessel branching pattern the aortic arch. Mild atherosclerotic plaque within the proximal right internal carotid artery. No hemodynamically significant stenosis Normal left carotid system. Vertebral arteries are normal.  IMPRESSION:  No hemodynamically  significant stenosis of the carotid or vertebral arteries.  Electronically Signed   By: Deatra Robinson M.D.   On: 04/06/2021 02:56   MR BRAIN WO CONTRAST Result Date: 04/05/2021 CLINICAL DATA:  Acute neurologic deficit EXAM: MRI HEAD WITHOUT CONTRAST TECHNIQUE: Multiplanar, multiecho pulse sequences of the brain and surrounding structures were obtained without intravenous contrast. COMPARISON:  None. FINDINGS: Brain: Intermediate sized area of acute ischemia in the left basal ganglia. No acute or chronic hemorrhage. Normal white matter signal, parenchymal  volume and CSF spaces. The midline structures are normal. Vascular: Major flow voids are preserved. Skull and upper cervical spine: Normal calvarium and skull base. Visualized upper cervical spine and soft tissues are normal. Sinuses/Orbits:No paranasal sinus fluid levels or advanced mucosal thickening. No mastoid or middle ear effusion. Normal orbits. Other: None.  IMPRESSION:  Intermediate sized area of acute ischemia in the left basal ganglia. No hemorrhage or mass effect.  Electronically Signed   By: Deatra Robinson M.D.   On: 04/05/2021 22:17   VAS [place] LOWER EXTREMITY VENOUS (DVT) Result Date: 04/06/2021  Lower Venous DVT Study Patient Name:  Barbara Jacobs  Date of Exam:   04/06/2021 Medical Rec #: [id number]            Accession #:    [id number] Date of Birth: 29-Nov-1970            Patient Gender: F Patient Age:   50 years Exam Location:  [facility] Procedure:      VAS [place] LOWER EXTREMITY VENOUS (DVT) Referring Phys: Scheryl Marten Torrance Frech --------------------------------------------------------------------------------  Indications: Stroke.  Comparison Study: No prior study on file Performing Technologist: Sherren Kerns RVS  Examination Guidelines: A complete evaluation includes B-mode imaging, spectral Doppler, color Doppler, and power Doppler as needed of all accessible portions of each vessel. Bilateral testing is considered an integral part of a complete examination. Limited examinations for reoccurring indications may be performed as noted. The reflux portion of the exam is performed with the patient in reverse Trendelenburg.  +---------+---------------+---------+-----------+----------+--------------+ RIGHT    CompressibilityPhasicitySpontaneityPropertiesThrombus Aging +---------+---------------+---------+-----------+----------+--------------+ CFV      Full           Yes      Yes                                 +---------+---------------+---------+-----------+----------+--------------+ SFJ       Full                                                        +---------+---------------+---------+-----------+----------+--------------+ FV Prox  Full                                                        +---------+---------------+---------+-----------+----------+--------------+ FV Mid   Full                                                        +---------+---------------+---------+-----------+----------+--------------+ FV DistalFull                                                        +---------+---------------+---------+-----------+----------+--------------+  PFV      Full                                                        +---------+---------------+---------+-----------+----------+--------------+ POP      Full           Yes      Yes                                 +---------+---------------+---------+-----------+----------+--------------+ PTV      Full                                                        +---------+---------------+---------+-----------+----------+--------------+ PERO     Full                                                        +---------+---------------+---------+-----------+----------+--------------+   +---------+---------------+---------+-----------+----------+--------------+ LEFT     CompressibilityPhasicitySpontaneityPropertiesThrombus Aging +---------+---------------+---------+-----------+----------+--------------+ CFV      Full           Yes      Yes                                 +---------+---------------+---------+-----------+----------+--------------+ SFJ      Full                                                        +---------+---------------+---------+-----------+----------+--------------+ FV Prox  Full                                                        +---------+---------------+---------+-----------+----------+--------------+ FV Mid   Full                                                         +---------+---------------+---------+-----------+----------+--------------+ FV DistalFull                                                        +---------+---------------+---------+-----------+----------+--------------+ PFV      Full                                                        +---------+---------------+---------+-----------+----------+--------------+  POP      Full           Yes      Yes                                 +---------+---------------+---------+-----------+----------+--------------+ PTV      Full                                                        +---------+---------------+---------+-----------+----------+--------------+ PERO     Full                                                        +---------+---------------+---------+-----------+----------+--------------+      Summary:  BILATERAL: - No evidence of deep vein thrombosis seen in the lower extremities, bilaterally. -No evidence of popliteal cyst, bilaterally.   *See table(s) above for measurements and observations.    Preliminary     ECG - SR rate 81 BPM. (See cardiology reading for complete details)  PHYSICAL EXAM  Temp:  [97.7 F (36.5 C)-99.3 F (37.4 C)] 98.2 F (36.8 C) (09/04 1201) Pulse Rate:  [74-90] 75 (09/04 1201) Resp:  [18-22] 18 (09/04 1201) BP: (97-182)/(78-102) 171/94 (09/04 1201) SpO2:  [94 %-99 %] 98 % (09/04 1201)  General - obese, well developed, in no apparent distress.  Ophthalmologic - fundi not visualized due to noncooperation.  Cardiovascular - Regular rhythm and rate.  Mental Status -  Level of arousal and orientation to time, place, and person were intact. Language including naming, repetition, comprehension was assessed and found intact.  Attention span and concentration were normal. Fund of Knowledge was assessed and was intact.  Cranial Nerves II - XII - II - Visual field intact OU. III, IV, VI - Extraocular movements  intact. V - Facial sensation intact bilaterally. VII - Facial movement intact bilaterally. VIII - Hearing & vestibular intact bilaterally. X - Palate elevates symmetrically. XI - Chin turning & shoulder shrug intact bilaterally. XII - Tongue protrusion intact.  Motor Strength - The patient's strength was normal in all extremities and pronator drift was absent except mild right hand decreased dexterity.  Bulk was normal and fasciculations were absent.   Motor Tone - Muscle tone was assessed at the neck and appendages and was normal.  Reflexes - The patient's reflexes were symmetrical in all extremities and she had no pathological reflexes.  Sensory - Light touch, temperature/pinprick were assessed and were symmetrical.    Coordination - The patient had normal movements in the hands with no ataxia or dysmetria.  Tremor was absent.  Gait and Station - deferred.   ASSESSMENT/PLAN Barbara Jacobs is a 50 y.o. 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 MRI brain without contrast demonstrated a left basal ganglia lacunar stroke. She did not receive tPA due to late presentation (>4.5 hours from time of onset).  Stroke: large left basal ganglia stroke - likely small vessel disease, however, embolic infarct also possible given the size.  MRI head -  large acute ischemia in the left BG and CR No hemorrhage or mass effect.  MRA head and neck - negative CTA Neck - unremarkable Bilateral LE Venous Dopplers - no DVT 2D Echo EF 60-65% TCD bubble study negative for PFO Recommend 30 day cardiac event monitoring to rule out A. Fib as outpt [employer] Corona Virus 2 - negative LDL - 78 HgbA1c - 8.6 UDS - negative VTE prophylaxis - Lovenox No antithrombotic prior to admission, now on aspirin 81 mg daily and clopidogrel 75 mg daily DAPT for 3 weeks and then aspirin  alone Patient will be counseled to be compliant with her antithrombotic medications Ongoing aggressive stroke risk factor management Therapy recommendations: Outpatient PT/OT Disposition:  home today  Hypertension Home BP meds: Coreg ; Zestril Stable on the high end Gradually normalize in 2-3 days Consider to restart home BP meds Long-term BP goal normotensive  Hyperlipidemia Home Lipid lowering medication: Lipitor 80 mg daily  LDL 78, goal < 70 Current lipid lowering medication: Lipitor 80 mg daily  Continue statin at discharge  Diabetes Home diabetic meds: Truelicity ; Glucophage ; Glucotrol  HgbA1c 8.6, goal < 7.0 SSI CBG monitoring Close PCP follow-up for better DM control  Other Stroke Risk Factors ETOH use, advised to drink no more than 1 alcoholic beverage per day. 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 Please call with questions. Pt [facility] in about 4 weeks. Thanks for the consult.   Marvel PlanJindong Alanis Clift, MD PhD Stroke Neurology 04/07/2021 1:46 PM   To contact Stroke Continuity provider, please refer to [email or web address]. After hours, contact General Neurology

## 2021-04-07 NOTE — Progress Notes (Signed)
Order to discharge pt home.  Discharge instructions/AVS given to patient and reviewed - education provided as needed.  Stoke information revied with pt.  Pt verbalized understanding.  Pt advised to call PCP and/or come back to the hospital if there are any problems. Pt verbalized understanding.

## 2021-04-08 ENCOUNTER — Other Ambulatory Visit: Payer: Self-pay | Admitting: Physician Assistant

## 2021-04-08 DIAGNOSIS — I639 Cerebral infarction, unspecified: Secondary | ICD-10-CM

## 2021-04-08 NOTE — Progress Notes (Signed)
Received msg in Tr[facility] inbasket:  "Hi,  I would like to request that this patient be contacted for consideration of a 30 day event monitor per neurology recommendations for stroke. Thank you.  [name], MD"  Msg sent to monitor scheduling team to help arrange and to call pt to let her know it will be mailed to her house. Dr. Eden Emms to read given that he is hospital DOD per protocol. Latoya Maulding PA-C

## 2021-04-09 ENCOUNTER — Encounter: Payer: Self-pay | Admitting: *Deleted

## 2021-04-09 NOTE — Progress Notes (Signed)
Cardiac Event Monitor mailed to pt's home.  Have sent instructions via mychart.

## 2021-04-16 ENCOUNTER — Other Ambulatory Visit: Payer: Self-pay

## 2021-04-16 ENCOUNTER — Encounter: Payer: Self-pay | Admitting: Occupational Therapy

## 2021-04-16 ENCOUNTER — Ambulatory Visit: Payer: BC Managed Care – PPO

## 2021-04-16 ENCOUNTER — Ambulatory Visit: Payer: BC Managed Care – PPO | Attending: Family Medicine | Admitting: Occupational Therapy

## 2021-04-16 DIAGNOSIS — R482 Apraxia: Secondary | ICD-10-CM

## 2021-04-16 DIAGNOSIS — R278 Other lack of coordination: Secondary | ICD-10-CM | POA: Insufficient documentation

## 2021-04-16 DIAGNOSIS — R471 Dysarthria and anarthria: Secondary | ICD-10-CM | POA: Diagnosis present

## 2021-04-16 DIAGNOSIS — R208 Other disturbances of skin sensation: Secondary | ICD-10-CM | POA: Diagnosis present

## 2021-04-16 DIAGNOSIS — R41841 Cognitive communication deficit: Secondary | ICD-10-CM | POA: Insufficient documentation

## 2021-04-16 NOTE — Patient Instructions (Signed)
11. Grip Strengthening (Resistive Putty)   Squeeze putty using thumb and all fingers. Repeat 15 times. Do 1-2 sessions per day.   Extension (Assistive Putty)   Roll putty back and forth, being sure to use all fingertips. Repeat 3 times. Do 1-2 sessions per day.  Then pinch as below.   Palmar Pinch Strengthening (Resistive Putty)   Pinch putty between thumb and each fingertip in turn after rolling out

## 2021-04-16 NOTE — Patient Instructions (Signed)
Things to try:  -singing -reading aloud for 5-10 minutes, twice a day  -fidgeting with something

## 2021-04-16 NOTE — Therapy (Signed)
[facility] [address] [place], [place], [address] Phone: [phone]   Fax:  [phone]  Speech Language Pathology Evaluation  Patient Details  Name: Barbara Jacobs MRN: [id number] Date of Birth: 01-09-1971 Referring Provider (SLP): Narda Bonds, MD Verlon Au MD (PCP-doc))   Encounter Date: 04/16/2021   End of Session - 04/16/21 2002     Visit Number 1    Number of Visits 17    Date for SLP Re-Evaluation 06/14/21    Authorization Type BCBS    SLP Start Time 1700    SLP Stop Time  1745    SLP Time Calculation (min) 45 min    Activity Tolerance Patient tolerated treatment well             Past Medical History:  Diagnosis Date   CAD (coronary artery disease) 05/04/2014   Coronary artery disease    a. inferior STEMI 03/2009 s/p BMS to prox PDA. b. NSTEMI 04/2014: mild diffuse nonobstructive CAD, continued patency of previously placed PDA, no clear culprit. Amlodipine and Plavix added.   H/O medication noncompliance    HTN (hypertension) 08/31/2012   Hyperlipidemia    Hypertension    Hypertriglyceridemia 02/13/2016   Morbid obesity (HCC)    Myocardial infarction (HCC)    Obesity, Class II, BMI 35-39.9, with comorbidity 02/13/2016   PMDD (premenstrual dysphoric disorder) 08/31/2012   Poorly controlled type 2 diabetes mellitus with circulatory disorder (HCC) 03/30/2015   Type 2 diabetes mellitus with vascular disease [facility])     Past Surgical History:  Procedure Laterality Date   CORONARY STENT PLACEMENT     LEFT HEART CATHETERIZATION WITH CORONARY ANGIOGRAM N/A 04/17/2014   Procedure: LEFT HEART CATHETERIZATION WITH CORONARY ANGIOGRAM;  Surgeon: Micheline Chapman, MD;  Location: [facility] CATH LAB;  Service: Cardiovascular;  Laterality: N/A;    There were no vitals filed for this visit.       SLP Evaluation [facility] - 04/16/21 1705       SLP Visit Information   SLP Received On 04/06/21     Referring Provider (SLP) Narda Bonds, MD   Verlon Au MD (PCP-doc)   Onset Date 04-05-21    Medical Diagnosis Cerebrovascular accident      Subjective   Patient/Family Stated Goal "to speak again & to not lose my words"      Pain Assessment   Currently in Pain? No/denies      General Information   HPI This 50 y.o. female who was admitted with 48 hour h/o word finding difficulties.  MRI of brain showed a moderate sized acute ischemic stroke in Lt basal ganglia.  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     Cognition   Overall Cognitive Status Within Functional Limits for tasks assessed   Pt declined cognitive changes, although some reduced executive functioning indicated during hospitalization     Auditory Comprehension   Overall Auditory Comprehension Appears within functional limits for tasks assessed      Verbal Expression   Overall Verbal Expression Appears  within functional limits for tasks assessed      Oral Motor/Sensory Function   Overall Oral Motor/Sensory Function Impaired    Labial ROM Within Functional Limits    Labial Coordination Reduced    Lingual ROM Within Functional Limits    Lingual Symmetry Within Functional Limits    Lingual Coordination Reduced    Facial Symmetry Within Functional Limits      Motor Speech   Overall Motor Speech Impaired    Respiration Within functional limits    Phonation Normal    Resonance Within functional limits    Articulation Within functional limitis    Intelligibility Intelligible    Motor Planning Impaired    Level of Impairment Sentence    Motor Speech Errors Groping for  words;Inconsistent    Effective Techniques Slow rate;Pause;Pacing                             SLP Education - 04/16/21 2002     Education Details eval results, possible goals, recommendations to aid speech fluency    Person(s) Educated Patient    Methods Explanation;Demonstration;Verbal cues    Comprehension Verbalized understanding;Returned demonstration;Need further instruction              SLP Short Term Goals - 04/16/21 2012       SLP SHORT TERM GOAL #1   Title Pt will complete HEP given rare min A over 2 sessions    Time 4    Period Weeks    Status New    Target Date 05/17/21      SLP SHORT TERM GOAL #2   Title Pt will effectively use learned compensations to aid speech fluency in 10-15 min mod complex conversation given rare min A over 2 sessions    Time 4    Period Weeks    Status New    Target Date 05/17/21      SLP SHORT TERM GOAL #3   Title 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 with reduced frequency of pausing subjectively reported over 2 sessions    Time 4    Period Weeks    Status New    Target Date 05/17/21      SLP SHORT TERM GOAL #4   Title Pt will report ability to effectively participate in simple telephone conversations with friends or family with use of compensations and rare extra time over 2 sessions    Time 4    Period Weeks    Status New    Target Date 05/17/21      SLP SHORT TERM GOAL #5   Title Pt will complete formal cognitive linguistic assessment as needed (goals to be added at that time)    Time 4    Period Weeks    Status New    Target Date 05/17/21              SLP Long Term Goals - 04/16/21 2015       SLP LONG TERM GOAL #1   Title Pt will effectively use learned compensations to aid speech fluency in 20-30 min mod complex conversation given rare min A over 2 sessions    Time 6    Period Weeks    Status New    Target Date 05/31/21      SLP LONG TERM GOAL #2   Title Pt will report  effective use of compensations while teaching in classroom and talking over phone with reduced  frequency of pausing subjectively reported over 2 sessions    Time 8    Period Weeks    Status New    Target Date 06/14/21      SLP LONG TERM GOAL #3   Title Pt will report less frustration and improved communciation effectiveness via PROM by 2 points at last ST session    Time 8    Period Weeks    Status New    Target Date 06/14/21              Plan - 04/16/21 1721     Clinical Impression Statement "Alaine" was referred for OPST secondary to CVA in September 2022. Pt stated she has "gaps in her speech" with usual dysfluency in speech noted in conversation. Pt reported experiencing slurred speech following hospitalization, which has mostly resolved. Pt denied any difficulty with naming, reading, or writing. [employer] Short Form completed with no anomia or dysnomia exhibited. OME revealed reduced oral motor coordination for AMR/SMR. Pt read alould multiple paragraphs, with mild improvements in speech fluency noted. Pt reports conversational/spontaneous connected speech is more challenging than structured tasks. Pt reports feeling very frustrated with current speech impairment with tearfulness exhibited during evaluation. Pt is avoiding talking unless necessary and is no longer talking on the telephone due to change in speech. SLP recommended various techniques to compensate for current speech impairment, including use of fidget device to aid fluency and reading while presenting at work. While working putty in hand, fluency and rate of speech drastically improved. 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 Skilled ST is warranted to address apraxia, dysarthria, and acquired dysfluency impacting pt's ability to return to PLOF.    Speech Therapy Frequency 2x / week    Duration 8  weeks    Treatment/Interventions Compensatory strategies;Functional tasks;Patient/family education;Cognitive reorganization;Language facilitation;Compensatory techniques;Internal/external aids;SLP instruction and feedback;Multimodal communcation approach    Potential to Achieve Goals Good    SLP Home Exercise Plan provided    Consulted and Agree with Plan of Care Patient             Patient will benefit from skilled therapeutic intervention in order to improve the following deficits and impairments:   Dysarthria and anarthria  Verbal apraxia    Problem List Patient Active Problem List   Diagnosis Date Noted   Acute ischemic stroke (HCC) 04/05/2021   Obesity, Class II, BMI 35-39.9, with comorbidity 02/13/2016   Hypertriglyceridemia 02/13/2016   Poorly controlled type 2 diabetes mellitus with circulatory disorder (HCC) 03/30/2015   CAD (coronary artery disease) 05/04/2014   Hyperlipidemia 08/31/2012   HTN (hypertension) 08/31/2012   PMDD (premenstrual dysphoric disorder) 08/31/2012    Janann Colonel, MA CCC-SLP 04/16/2021, 8:29 PM  [facility] [address] [place], [place], [address] Phone: [phone]   Fax:  [phone]  Name: Barbara Jacobs MRN: [id number] Date of Birth: 1971/05/17

## 2021-04-16 NOTE — Therapy (Signed)
[facility] [address] [place], [place], [address] Phone: [phone]   Fax:  [phone]  Occupational Therapy Evaluation  Patient Details  Name: Barbara Jacobs MRN: [id number] Date of Birth: 05-21-1971 No data recorded  Encounter Date: 04/16/2021   OT End of Session - 04/16/21 1727     Visit Number 1    Number of Visits 1    OT Start Time 1604    OT Stop Time 1700    OT Time Calculation (min) 56 min    Activity Tolerance Patient tolerated treatment well    Behavior During Therapy [facility] for tasks assessed/performed             Past Medical History:  Diagnosis Date   CAD (coronary artery disease) 05/04/2014   Coronary artery disease    a. inferior STEMI 03/2009 s/p BMS to prox PDA. b. NSTEMI 04/2014: mild diffuse nonobstructive CAD, continued patency of previously placed PDA, no clear culprit. Amlodipine and Plavix added.   H/O medication noncompliance    HTN (hypertension) 08/31/2012   Hyperlipidemia    Hypertension    Hypertriglyceridemia 02/13/2016   Morbid obesity (HCC)    Myocardial infarction (HCC)    Obesity, Class II, BMI 35-39.9, with comorbidity 02/13/2016   PMDD (premenstrual dysphoric disorder) 08/31/2012   Poorly controlled type 2 diabetes mellitus with circulatory disorder (HCC) 03/30/2015   Type 2 diabetes mellitus with vascular disease [facility])     Past Surgical History:  Procedure Laterality Date   CORONARY STENT PLACEMENT     LEFT HEART CATHETERIZATION WITH CORONARY ANGIOGRAM N/A 04/17/2014   Procedure: LEFT HEART CATHETERIZATION WITH CORONARY ANGIOGRAM;  Surgeon: Micheline Chapman, MD;  Location: [facility] CATH LAB;  Service: Cardiovascular;  Laterality: N/A;    There were no vitals filed for this visit.   Subjective Assessment - 04/16/21 1615     Subjective  They told me I had to come    Currently in Pain? No/denies    Pain Score 0-No pain               [facility] OT Assessment - 04/16/21  0001       Assessment   Medical Diagnosis Left Basal Ganglia Infarct    Onset Date/Surgical Date 04/05/21    Hand Dominance Right    Next MD Visit [facility], sending notes to PCP- Virl Son    Prior Therapy Acute therapy      Prior Function   Level of Independence Independent with basic ADLs    Vocation Full time employment    Buyer, retail    Leisure read, make candles      ADL   Eating/Feeding Independent    Grooming Independent    Upper Body Bathing Independent    Lower Body Bathing Independent    Upper Body Dressing Independent    Lower Body Dressing Independent    Lobbyist - Solicitor -  Database administrator Independent      IADL   Prior Level of Function Meal Prep Independent    Meal Prep Plans, prepares and serves adequate meals independently    Prior Level of Function 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    Prior Level of Function Medication Managment Independent    Medication Management Is responsible for taking medication in correct dosages at correct time    Prior  Level of Function Chemical engineer financial matters independently (budgets, writes checks, pays rent, bills goes to bank), collects and keeps track of income      Written Expression   Dominant Hand Right    Handwriting 100% legible      Vision - History   Baseline Vision Wears glasses all the time    Patient Visual Report Other (comment)   blurriness     Vision Assessment   Eye Alignment Within Functional Limits    Ocular Range of Motion Restricted on left    Alignment/Gaze Preference --   limited spontaneous excursion right   Tracking/Visual Pursuits Decreased smoothness of horizontal tracking    Saccades Additional eye shifts occurred during testing    Comment Patient has extra horizontal eye  movement with left eye with saccadic activity.  Patient unaware,and has no symptoms.  Patient reports mild blurriness since stroke - and has eye doctor appointment next month.      Activity Tolerance   Activity Tolerance --   Endurance limits activity   Activity Tolerance Comments Patient reports      Cognition   Overall Cognitive Status Within Functional Limits for tasks assessed    Cognition Comments Reports no trouble with attention, memory, problem solving      Posture/Postural Control   Posture/Postural Control No significant limitations      Sensation   Light Touch Appears Intact    Stereognosis Appears Intact    Hot/Cold Appears Intact    Proprioception Appears Intact    Additional Comments Reports hand fell asleep really quickly today      Coordination   Fine Motor Movements are Fluid and Coordinated No    Coordination and Movement Description Delayed movement, difficulty grading activity    Finger Nose Finger Test significant undershooting left    9 Hole Peg Test Right;Left    Right 9 Hole Peg Test 43.53    Left 9 Hole Peg Test 42.31      ROM / Strength   AROM / PROM / Strength AROM;Strength      AROM   Overall AROM  Within functional limits for tasks performed      Strength   Overall Strength Within functional limits for tasks performed      Hand Function   Right Hand Gross Grasp Functional    Right Hand Grip (lbs) 63.2    Right Hand Lateral Pinch 9 lbs    Right Hand 3 Point Pinch 9 lbs    Left Hand Gross Grasp Functional    Left Hand Grip (lbs) 52.6    Left Hand Lateral Pinch 8 lbs    Left 3 point pinch 5 lbs    Comment more than 10 lb difference between dominant - non-dominant hand.  Issued putty and reviewed HEP                              OT Education - 04/16/21 1725     Education Details Putty exercises for hand strength - red.  Reviewed results of eval - patient with mild deficits but little functional impact per her report.  No  further OT warranted.  Patient planning to reduce hours at school as going back full time has proven to be challenging due to fatigue.    Person(s) Educated Patient    Methods Explanation    Comprehension Verbalized understanding  Plan - 04/16/21 1727     Clinical Impression Statement Patient is a 50 yr old woman with history of HTN, and uncontrolled diabetes.  Patient admitted 04/05/21 with moderate left basal ganglia infarct.  Patient with mild coordination deficits - more pronounced in LUE, mild visual coordination deficits - without apparent functional deficits, and mild sensory deficit in RUE - occasional numbness in Right hand when resting on elbow.  Patient is independent with ADL/IADL, is driving, and has returned to work.  Patient was educated in exercises to strengthen left hand grip.    OT Occupational Profile and History Problem Focused Assessment - Including review of records relating to presenting problem    Occupational performance deficits (Please refer to evaluation for details): Work    Games developer / Function / Geologist, engineering;Endurance    Rehab Potential Excellent    Clinical Decision Making Limited treatment options, no task modification necessary    Comorbidities Affecting Occupational Performance: May have comorbidities impacting occupational performance    Modification or Assistance to Complete Evaluation  No modification of tasks or assist necessary to complete eval             Patient will benefit from skilled therapeutic intervention in order to improve the following deficits and impairments:   Body Structure / Function / Physical Skills: Coordination, Endurance       Visit Diagnosis: Other lack of coordination - Plan: Ot plan of care cert/re-cert  Other disturbances of skin sensation - Plan: Ot plan of care cert/re-cert    Problem List Patient Active Problem List   Diagnosis Date Noted   Acute  ischemic stroke (HCC) 04/05/2021   Obesity, Class II, BMI 35-39.9, with comorbidity 02/13/2016   Hypertriglyceridemia 02/13/2016   Poorly controlled type 2 diabetes mellitus with circulatory disorder (HCC) 03/30/2015   CAD (coronary artery disease) 05/04/2014   Hyperlipidemia 08/31/2012   HTN (hypertension) 08/31/2012   PMDD (premenstrual dysphoric disorder) 08/31/2012    Collier Salina, OT/L 04/16/2021, 5:35 PM  [facility] [address] [place], [place], [address] Phone: [phone]   Fax:  [phone]  Name: Barbara Jacobs MRN: [id number] Date of Birth: 10/23/1970

## 2021-04-18 ENCOUNTER — Ambulatory Visit: Payer: BC Managed Care – PPO

## 2021-04-18 ENCOUNTER — Other Ambulatory Visit: Payer: Self-pay

## 2021-04-18 DIAGNOSIS — R278 Other lack of coordination: Secondary | ICD-10-CM | POA: Diagnosis not present

## 2021-04-18 DIAGNOSIS — R482 Apraxia: Secondary | ICD-10-CM

## 2021-04-18 DIAGNOSIS — R471 Dysarthria and anarthria: Secondary | ICD-10-CM

## 2021-04-18 NOTE — Patient Instructions (Addendum)
  ABDOMINAL BREATHING    Shoulders down - this is a cue to relax Place your hand on your abdomen - this helps you focus on easy abdominal breath support - the best and most relaxed way to breathe Breathe in through your nose and fill your belly with air, watching your hand move outward Breathe out through your mouth and watch your belly move in. An audible "sh"  may help   Think of your belly as a balloon, when you fill with air (inhale), the balloon gets bigger. As the air goes out (exhale), the balloon deflates.  If you are having difficulty coordinating this, lay on your back with a plastic cup on your belly and repeat the above steps, watching you belly move up with inhalation and down with exhalations  Practice breathing in and out in front of a mirror, watching your belly Breathe in for a count of 5 and breathe out for a count of 5  Now as you breathe out, get a picture of relaxing in your mind Feel the constant in-out of your breathing with your belly Picture the tension in your throat and chest evaporate like steam, melting away and FEEL it do so Picture your throat opening up so wide that a grapefruit or softball could fit through your throat.   Practice this throughout the day when you are not having symptoms. For example: in the car, when watching TV, before medications. Regular practice when you are feeling well is important.  Make it automatic and use it at the first sense of throat tightness to prevent or suppress the VCD. You may start with the inhale or exhale.

## 2021-04-18 NOTE — Therapy (Signed)
[facility] [address] [place], [place], [address] Phone: [phone]   Fax:  [phone]  Speech Language Pathology Treatment  Patient Details  Name: Barbara Jacobs MRN: [id number] Date of Birth: [birth year] Referring Provider (SLP): Narda Bonds, MD Verlon Au MD (PCP-doc))   Encounter Date: 04/18/2021   End of Session - 04/18/21 1558     Visit Number 2    Number of Visits 17    Date for SLP Re-Evaluation 06/14/21    Authorization Type BCBS    SLP Start Time 1535    SLP Stop Time  1620    SLP Time Calculation (min) 45 min    Activity Tolerance Patient tolerated treatment well             Past Medical History:  Diagnosis Date   CAD (coronary artery disease) 05/04/2014   Coronary artery disease    a. inferior STEMI 03/2009 s/p BMS to prox PDA. b. NSTEMI 04/2014: mild diffuse nonobstructive CAD, continued patency of previously placed PDA, no clear culprit. Amlodipine and Plavix added.   H/O medication noncompliance    HTN (hypertension) 08/31/2012   Hyperlipidemia    Hypertension    Hypertriglyceridemia 02/13/2016   Morbid obesity (HCC)    Myocardial infarction (HCC)    Obesity, Class II, BMI 35-39.9, with comorbidity 02/13/2016   PMDD (premenstrual dysphoric disorder) 08/31/2012   Poorly controlled type 2 diabetes mellitus with circulatory disorder (HCC) 03/30/2015   Type 2 diabetes mellitus with vascular disease [facility])     Past Surgical History:  Procedure Laterality Date   CORONARY STENT PLACEMENT     LEFT HEART CATHETERIZATION WITH CORONARY ANGIOGRAM N/A 04/17/2014   Procedure: LEFT HEART CATHETERIZATION WITH CORONARY ANGIOGRAM;  Surgeon: Micheline Chapman, MD;  Location: [facility] CATH LAB;  Service: Cardiovascular;  Laterality: N/A;    There were no vitals filed for this visit.   Subjective Assessment - 04/18/21 1535     Subjective "I'm trying"    Currently in Pain? No/denies                    ADULT SLP TREATMENT - 04/18/21 1538       General Information   Behavior/Cognition Alert;Cooperative;Pleasant mood      Treatment Provided   Treatment provided Cognitive-Linquistic      Cognitive-Linquistic Treatment   Treatment focused on Apraxia;Dysarthria    Skilled Treatment Pt reports inconsistent effectiveness of previously discussed recommendations for speech fluency (use of movement to aid fluency). SLP introduced abdominal breathing and pacing this session to aid reading fluency, which was effective. Fluidity of speech improved with more frequent inhalations and pacing via tracking. In conversation, less frequent pausing noted while working putty in hand. Some high level word finding indicated related to complex verbiage. Some mild additional time required for trialed naming tasks this session.      Assessment / Recommendations / Plan   Plan Continue with current plan of care      Progression Toward Goals   Progression toward goals Progressing toward goals              SLP Education - 04/18/21 1604     Education Details abdominal breathing, pacing, compensations    Person(s) Educated Patient    Methods Explanation;Demonstration;Handout;Verbal cues    Comprehension Verbalized understanding;Returned demonstration;Need further instruction;Verbal cues required              SLP Short Term Goals - 04/18/21 1556  SLP SHORT TERM GOAL #1   Title Pt will complete HEP given rare min A over 2 sessions    Time 4    Period Weeks    Status On-going    Target Date 05/17/21      SLP SHORT TERM GOAL #2   Title Pt will effectively use learned compensations to aid speech fluency in 10-15 min mod complex conversation given rare min A over 2 sessions    Time 4    Period Weeks    Status On-going    Target Date 05/17/21      SLP SHORT TERM GOAL #3   Title 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 with reduced frequency of pausing subjectively reported  over 2 sessions    Time 4    Period Weeks    Status On-going    Target Date 05/17/21      SLP SHORT TERM GOAL #4   Title Pt will report ability to effectively participate in simple telephone conversations with friends or family with use of compensations and rare extra time over 2 sessions    Time 4    Period Weeks    Status On-going    Target Date 05/17/21      SLP SHORT TERM GOAL #5   Title Pt will complete formal cognitive linguistic assessment as needed (goals to be added at that time)    Time 4    Period Weeks    Status On-going    Target Date 05/17/21              SLP Long Term Goals - 04/18/21 1557       SLP LONG TERM GOAL #1   Title Pt will effectively use learned compensations to aid speech fluency in 20-30 min mod complex conversation given rare min A over 2 sessions    Time 6    Period Weeks    Status On-going    Target Date 05/31/21      SLP LONG TERM GOAL #2   Title 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 SLP targeted dysfluency in conversational speech and reading with use of various techniques, including abdominal breathing, pacing, and manual manipulation of tactile cues to aid fluidity of speech. See "skilled treatment" for additional details of today's session. Skilled ST is warranted to address apraxia, dysarthria, and acquired dysfluency impacting pt's ability to return to PLOF.    Speech Therapy  Frequency 2x / week    Duration 8 weeks    Treatment/Interventions Compensatory strategies;Functional tasks;Patient/family education;Cognitive reorganization;Language facilitation;Compensatory techniques;Internal/external aids;SLP instruction and feedback;Multimodal communcation approach    Potential to Achieve Goals Good    SLP Home Exercise Plan provided    Consulted and Agree with Plan of Care Patient  Patient will benefit from skilled therapeutic intervention in order to improve the following deficits and impairments:   Verbal apraxia  Dysarthria and anarthria    Problem List Patient Active Problem List   Diagnosis Date Noted   Acute ischemic stroke (HCC) 04/05/2021   Obesity, Class II, BMI 35-39.9, with comorbidity 02/13/2016   Hypertriglyceridemia 02/13/2016   Poorly controlled type 2 diabetes mellitus with circulatory disorder (HCC) 03/30/2015   CAD (coronary artery disease) 05/04/2014   Hyperlipidemia 08/31/2012   HTN (hypertension) 08/31/2012   PMDD (premenstrual dysphoric disorder) 08/31/2012    Janann Colonel, MA CCC-SLP 04/18/2021, 4:48 PM  [facility] [address] [place], [place], [address] Phone: [phone]   Fax:  [phone]   Name: SABREE NUON MRN: [id number] Date of Birth: 10/24/1970

## 2021-04-25 ENCOUNTER — Ambulatory Visit: Payer: BC Managed Care – PPO

## 2021-04-25 ENCOUNTER — Other Ambulatory Visit: Payer: Self-pay

## 2021-04-25 DIAGNOSIS — R41841 Cognitive communication deficit: Secondary | ICD-10-CM

## 2021-04-25 DIAGNOSIS — R482 Apraxia: Secondary | ICD-10-CM

## 2021-04-25 DIAGNOSIS — R278 Other lack of coordination: Secondary | ICD-10-CM | POA: Diagnosis not present

## 2021-04-25 DIAGNOSIS — R471 Dysarthria and anarthria: Secondary | ICD-10-CM

## 2021-04-25 NOTE — Therapy (Signed)
[facility] [place] [facility] [address] [place], [place], [address] Phone: [phone]   Fax:  [phone]  Speech Language Pathology Treatment  Patient Details  Name: Barbara Jacobs MRN: [id number] Date of Birth: [birth year] Referring Provider (SLP): Barbara Aloe, MD Bartholome Bill MD (PCP-doc))   Encounter Date: 04/25/2021   End of Session - 04/25/21 2209     Visit Number 3    Number of Visits 17    Date for SLP Re-Evaluation 06/14/21    Authorization Type BCBS    SLP Start Time 1533    SLP Stop Time  70    SLP Time Calculation (min) 42 min    Activity Tolerance Patient tolerated treatment well             Past Medical History:  Diagnosis Date   CAD (coronary artery disease) 05/04/2014   Coronary artery disease    a. inferior STEMI 03/2009 s/p BMS to prox PDA. b. NSTEMI 04/2014: mild diffuse nonobstructive CAD, continued patency of previously placed PDA, no clear culprit. Amlodipine and Plavix added.   H/O medication noncompliance    HTN (hypertension) 08/31/2012   Hyperlipidemia    Hypertension    Hypertriglyceridemia 02/13/2016   Morbid obesity ([place])    Myocardial infarction ([place])    Obesity, Class II, BMI 35-39.9, with comorbidity 02/13/2016   PMDD (premenstrual dysphoric disorder) 08/31/2012   Poorly controlled type 2 diabetes mellitus with circulatory disorder ([place]) 03/30/2015   Type 2 diabetes mellitus with vascular disease [facility])     Past Surgical History:  Procedure Laterality Date   CORONARY STENT PLACEMENT     LEFT HEART CATHETERIZATION WITH CORONARY ANGIOGRAM N/A 04/17/2014   Procedure: LEFT HEART CATHETERIZATION WITH CORONARY ANGIOGRAM;  Surgeon: Barbara Ohara, MD;  Location: [facility] CATH LAB;  Service: Cardiovascular;  Laterality: N/A;    There were no vitals filed for this visit.   Subjective Assessment - 04/25/21 1548     Subjective "[address] -"  pt could not recall street name and became  tearful    Currently in Pain? No/denies                   ADULT SLP TREATMENT - 04/25/21 1550       General Information   Behavior/Cognition Alert;Cooperative   tearful     Treatment Provided   Treatment provided Cognitive-Linquistic      Cognitive-Linquistic Treatment   Treatment focused on Cognition;Apraxia;Dysarthria    Skilled Treatment SLP used this session to target STG of cog-linguistic testing. Pt became tearful once during testing with Cognitive Linguistic Quick Test. Scores were obtained for Memory portion (137 - mod  deficit), and for language (29- low-WNL). Subtest of design generation must be completed next session and remaining domain scores obtained, and goals added PRN.      Assessment / Recommendations / Plan   Plan Continue with current plan of care      Progression Toward Goals   Progression toward goals Progressing toward goals                SLP Short Term Goals - 04/25/21 2211       SLP SHORT TERM GOAL #1   Title Pt will complete HEP given rare min A over 2 sessions    Time 4    Period Weeks    Status On-going    Target Date 05/17/21      SLP SHORT TERM GOAL #2   Title  Pt will effectively use learned compensations to aid speech fluency in 10-15 min mod complex conversation given rare min A over 2 sessions    Time 4    Period Weeks    Status On-going    Target Date 05/17/21      SLP SHORT TERM GOAL #3   Title 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 with reduced frequency of pausing subjectively reported over 2 sessions    Time 4    Period Weeks    Status On-going    Target Date 05/17/21      SLP SHORT TERM GOAL #4   Title Pt will report ability to effectively participate in simple telephone conversations with friends or family with use of compensations and rare extra time over 2 sessions    Time 4    Period Weeks    Status On-going    Target Date 05/17/21      SLP SHORT TERM GOAL #5   Title Pt will complete formal  cognitive linguistic assessment as needed (goals to be added at that time)    Time 4    Period Weeks    Status Partially Met    Target Date 05/17/21              SLP Long Term Goals - 04/25/21 2211       SLP LONG TERM GOAL #1   Title Pt will effectively use learned compensations to aid speech fluency in 20-30 min mod complex conversation given rare min A over 2 sessions    Time 6    Period Weeks    Status On-going    Target Date 05/31/21      SLP LONG TERM GOAL #2   Title 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 SLP initiated cognitive communcation assessment today. See "skilled treatment" for additional details of today's session. Skilled ST is warranted to address apraxia, dysarthria, cognition, and acquired dysfluency impacting pt's ability to return to PLOF.    Speech Therapy Frequency 2x / week    Duration 8 weeks    Treatment/Interventions Compensatory strategies;Functional tasks;Patient/family education;Cognitive reorganization;Language facilitation;Compensatory techniques;Internal/external aids;SLP instruction and feedback;Multimodal communcation approach    Potential to Achieve Goals Good    SLP Home Exercise Plan provided    Consulted and Agree with Plan of Care Patient             Patient will benefit from skilled therapeutic intervention in order to improve the following deficits and impairments:   Verbal  apraxia  Dysarthria and anarthria  Cognitive communication deficit    Problem List Patient Active Problem List   Diagnosis Date Noted   Acute ischemic stroke ([place]) 04/05/2021   Obesity, Class II, BMI 35-39.9, with comorbidity 02/13/2016   Hypertriglyceridemia 02/13/2016  Poorly controlled type 2 diabetes mellitus with circulatory disorder ([place]) 03/30/2015   CAD (coronary artery disease) 05/04/2014   Hyperlipidemia 08/31/2012   HTN (hypertension) 08/31/2012   PMDD (premenstrual dysphoric disorder) 08/31/2012    [facility]  ,MS, CCC-SLP  04/25/2021, 10:13 PM  [place] [address] [place], [place], [address] Phone: [phone]   Fax:  [phone]   Name: Barbara Jacobs MRN: [id number] Date of Birth: [birth year]

## 2021-04-30 ENCOUNTER — Other Ambulatory Visit: Payer: Self-pay

## 2021-04-30 ENCOUNTER — Ambulatory Visit: Payer: BC Managed Care – PPO

## 2021-04-30 DIAGNOSIS — R482 Apraxia: Secondary | ICD-10-CM

## 2021-04-30 DIAGNOSIS — R41841 Cognitive communication deficit: Secondary | ICD-10-CM

## 2021-04-30 DIAGNOSIS — R278 Other lack of coordination: Secondary | ICD-10-CM | POA: Diagnosis not present

## 2021-04-30 DIAGNOSIS — R471 Dysarthria and anarthria: Secondary | ICD-10-CM

## 2021-05-01 NOTE — Therapy (Signed)
[facility] [address] [place], [place], [address] Phone: [phone]   Fax:  [phone]  Speech Language Pathology Treatment  Patient Details  Name: Barbara Jacobs MRN: [id number] Date of Birth: 05/07/1971 Referring Provider (SLP): Narda Bonds, MD Verlon Au MD (PCP-doc))   Encounter Date: 04/30/2021   End of Session - 04/30/21 1549     Visit Number 4    Number of Visits 17    Date for SLP Re-Evaluation 06/14/21    Authorization Type BCBS    SLP Start Time 1533    SLP Stop Time  1613    SLP Time Calculation (min) 40 min    Activity Tolerance Patient tolerated treatment well             Past Medical History:  Diagnosis Date   CAD (coronary artery disease) 05/04/2014   Coronary artery disease    a. inferior STEMI 03/2009 s/p BMS to prox PDA. b. NSTEMI 04/2014: mild diffuse nonobstructive CAD, continued patency of previously placed PDA, no clear culprit. Amlodipine and Plavix added.   H/O medication noncompliance    HTN (hypertension) 08/31/2012   Hyperlipidemia    Hypertension    Hypertriglyceridemia 02/13/2016   Morbid obesity (HCC)    Myocardial infarction (HCC)    Obesity, Class II, BMI 35-39.9, with comorbidity 02/13/2016   PMDD (premenstrual dysphoric disorder) 08/31/2012   Poorly controlled type 2 diabetes mellitus with circulatory disorder (HCC) 03/30/2015   Type 2 diabetes mellitus with vascular disease [facility])     Past Surgical History:  Procedure Laterality Date   CORONARY STENT PLACEMENT     LEFT HEART CATHETERIZATION WITH CORONARY ANGIOGRAM N/A 04/17/2014   Procedure: LEFT HEART CATHETERIZATION WITH CORONARY ANGIOGRAM;  Surgeon: Micheline Chapman, MD;  Location: [facility] CATH LAB;  Service: Cardiovascular;  Laterality: N/A;    There were no vitals filed for this visit.   Subjective Assessment - 04/30/21 1535     Subjective "getting better"    Currently in Pain? No/denies                    ADULT SLP TREATMENT - 04/30/21 1614       General Information   Behavior/Cognition Alert;Cooperative      Treatment Provided   Treatment provided Cognitive-Linquistic      Cognitive-Linquistic Treatment   Treatment focused on Cognition;Apraxia;Dysarthria    Skilled Treatment Pt reports "speech is better." Pt reports usual mental fatigue by end of day/week, in which SLP educated this often occurs following stroke and suggested implementing breaks. SLP reviewed compensations to aid speech fluency at work, which pt is using intermittently. Pt continues to report difficulty reading aloud, related to oral motor coordination and fatigue. Pt indicated some difficulty with pronounciation and production of multisyllabic words, in which SLP targeted this session for 2-5 syllables. Challenge increased for 4-5 syllables. CLQT completed this session, in which pt correctly generated 5/6 designs. CLQT results revealed moderate memory deficits and low WNL language. Functional memory/attention goal to be added.      Assessment / Recommendations / Plan   Plan Continue with current plan of care;Goals updated      Progression Toward Goals   Progression toward goals Progressing toward goals              SLP Education - 05/01/21 1222     Education Details compensations versus rehab    Person(s) Educated Patient    Methods Explanation;Demonstration;Handout  Comprehension Verbalized understanding;Returned demonstration;Need further instruction              SLP Short Term Goals - 04/30/21 1549       SLP SHORT TERM GOAL #1   Title Pt will complete HEP given rare min A over 2 sessions    Time 4    Period Weeks    Status On-going    Target Date 05/17/21      SLP SHORT TERM GOAL #2   Title Pt will effectively use learned compensations to aid speech fluency in 10-15 min mod complex conversation given rare min A over 2 sessions    Baseline 04-30-21    Time 4    Period  Weeks    Status On-going    Target Date 05/17/21      SLP SHORT TERM GOAL #3   Title 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 with reduced frequency of pausing subjectively reported over 2 sessions    Time 4    Period Weeks    Status On-going    Target Date 05/17/21      SLP SHORT TERM GOAL #4   Title Pt will report ability to effectively participate in simple telephone conversations with friends or family with use of compensations and rare extra time over 2 sessions    Time 4    Period Weeks    Status On-going    Target Date 05/17/21      SLP SHORT TERM GOAL #5   Title Pt will complete formal cognitive linguistic assessment as needed (goals to be added at that time)    Status Achieved    Target Date 05/17/21              SLP Long Term Goals - 04/30/21 1549       SLP LONG TERM GOAL #1   Title Pt will effectively use learned compensations to aid speech fluency in 20-30 min mod complex conversation given rare min A over 2 sessions    Time 6    Period Weeks    Status On-going    Target Date 05/31/21      SLP LONG TERM GOAL #2   Title 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 with reduced frequency of pausing subjectively reported over 2 sessions    Time 8    Period Weeks    Status On-going    Target Date 06/14/21      SLP LONG TERM GOAL #3   Title Pt will report less frustration and improved communciation effectiveness via PROM by 2 points at last ST session    Time 8    Period Weeks    Status On-going    Target Date 06/14/21      SLP LONG TERM GOAL #4   Title Pt will ID and correct errors on structured tasks with 80% accuracy given rare min A over 2 sessions    Time 8    Period Weeks    Status New    Target Date 06/14/21              Plan - 04/30/21 1549     Clinical Impression Statement "Bitania" was referred for OPST secondary to CVA in September 2022. SLP completed cognitive communcation  assessment today, with scores indicating moderate memory deficits and low WNL language. SLP targeted apraxia for multisyllabic words. See "skilled treatment" for additional details of today's session. Skilled ST is warranted to address  apraxia, dysarthria, cognition, and acquired dysfluency impacting pt's ability to return to PLOF.    Speech Therapy Frequency 2x / week    Duration 8 weeks    Treatment/Interventions Compensatory strategies;Functional tasks;Patient/family education;Cognitive reorganization;Language facilitation;Compensatory techniques;Internal/external aids;SLP instruction and feedback;Multimodal communcation approach    Potential to Achieve Goals Good    SLP Home Exercise Plan provided    Consulted and Agree with Plan of Care Patient             Patient will benefit from skilled therapeutic intervention in order to improve the following deficits and impairments:   Verbal apraxia  Dysarthria and anarthria  Cognitive communication deficit    Problem List Patient Active Problem List   Diagnosis Date Noted   Acute ischemic stroke (HCC) 04/05/2021   Obesity, Class II, BMI 35-39.9, with comorbidity 02/13/2016   Hypertriglyceridemia 02/13/2016   Poorly controlled type 2 diabetes mellitus with circulatory disorder (HCC) 03/30/2015   CAD (coronary artery disease) 05/04/2014   Hyperlipidemia 08/31/2012   HTN (hypertension) 08/31/2012   PMDD (premenstrual dysphoric disorder) 08/31/2012    Janann Colonel, MA CCC-SLP 05/01/2021, 12:27 PM  [facility] [address] [place], [place], [address] Phone: [phone]   Fax:  [phone]   Name: Barbara Jacobs MRN: [id number] Date of Birth: 1971-01-10

## 2021-05-02 ENCOUNTER — Ambulatory Visit: Payer: BC Managed Care – PPO

## 2021-05-02 ENCOUNTER — Other Ambulatory Visit: Payer: Self-pay

## 2021-05-02 DIAGNOSIS — R471 Dysarthria and anarthria: Secondary | ICD-10-CM

## 2021-05-02 DIAGNOSIS — R278 Other lack of coordination: Secondary | ICD-10-CM | POA: Diagnosis not present

## 2021-05-02 DIAGNOSIS — R41841 Cognitive communication deficit: Secondary | ICD-10-CM

## 2021-05-02 DIAGNOSIS — R482 Apraxia: Secondary | ICD-10-CM

## 2021-05-02 NOTE — Patient Instructions (Signed)

## 2021-05-02 NOTE — Therapy (Signed)
[address] [facility] [address] [place], [place], [address] Phone: [phone]   Fax:  [phone]  Speech Language Pathology Treatment  Patient Details  Name: Barbara Jacobs MRN: [id number] Date of Birth: 03/22/1971 Referring Provider (SLP): Narda Bonds, MD Verlon Au MD (PCP-doc))   Encounter Date: 05/02/2021   End of Session - 05/02/21 1529     Visit Number 5    Number of Visits 17    Date for SLP Re-Evaluation 06/14/21    Authorization Type BCBS    SLP Start Time 1530    SLP Stop Time  1610    SLP Time Calculation (min) 40 min    Activity Tolerance Patient tolerated treatment well             Past Medical History:  Diagnosis Date   CAD (coronary artery disease) 05/04/2014   Coronary artery disease    a. inferior STEMI 03/2009 s/p BMS to prox PDA. b. NSTEMI 04/2014: mild diffuse nonobstructive CAD, continued patency of previously placed PDA, no clear culprit. Amlodipine and Plavix added.   H/O medication noncompliance    HTN (hypertension) 08/31/2012   Hyperlipidemia    Hypertension    Hypertriglyceridemia 02/13/2016   Morbid obesity (HCC)    Myocardial infarction (HCC)    Obesity, Class II, BMI 35-39.9, with comorbidity 02/13/2016   PMDD (premenstrual dysphoric disorder) 08/31/2012   Poorly controlled type 2 diabetes mellitus with circulatory disorder (HCC) 03/30/2015   Type 2 diabetes mellitus with vascular disease [facility])     Past Surgical History:  Procedure Laterality Date   CORONARY STENT PLACEMENT     LEFT HEART CATHETERIZATION WITH CORONARY ANGIOGRAM N/A 04/17/2014   Procedure: LEFT HEART CATHETERIZATION WITH CORONARY ANGIOGRAM;  Surgeon: Micheline Chapman, MD;  Location: [facility] CATH LAB;  Service: Cardiovascular;  Laterality: N/A;    There were no vitals filed for this visit.   Subjective Assessment - 05/02/21 1530     Subjective "good"    Currently in Pain? No/denies                    ADULT SLP TREATMENT - 05/02/21 1529       General Information   Behavior/Cognition Alert;Cooperative      Treatment Provided   Treatment provided Cognitive-Linquistic      Cognitive-Linquistic Treatment   Treatment focused on Cognition;Apraxia;Dysarthria    Skilled Treatment Pt returned with school assignments to practice verbal discussion. Pt exhibited significant improvements in fluency and fluidity of speech with rare halting and pausing exhibited. Pt endorsed some trouble "losing the word" but pt often able to recover word with additional time. In 10+ mod complex conversation, pt exhibited rare anomia x2 which pt corrected with mild extra time. SLP educated patient on word finding strategies to utilize as needed.      Assessment / Recommendations / Plan   Plan Continue with current plan of care     Progression Toward Goals   Progression toward goals Progressing toward goals              SLP Education - 05/02/21 1613     Education Details anomia compensations    Person(s) Educated Patient    Methods Explanation;Demonstration;Handout    Comprehension Verbalized understanding;Returned demonstration;Need further instruction              SLP Short Term Goals - 05/02/21 1537       SLP SHORT TERM GOAL #1   Title Pt  will complete HEP given rare min A over 2 sessions    Baseline 05-02-21    Time 4    Period Weeks    Status On-going    Target Date 05/17/21      SLP SHORT TERM GOAL #2   Title Pt will effectively use learned compensations to aid speech fluency in 10-15 min mod complex conversation given rare min A over 2 sessions    Baseline 04-30-21    Time 4    Period Weeks    Status On-going    Target Date 05/17/21      SLP SHORT TERM GOAL #3   Title 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 with reduced frequency of pausing subjectively reported over 2 sessions    Baseline 05-02-21    Time 4    Period Weeks    Status On-going    Target Date  05/17/21      SLP SHORT TERM GOAL #4   Title Pt will report ability to effectively participate in simple telephone conversations with friends or family with use of compensations and rare extra time over 2 sessions    Baseline 05-02-21    Time 4    Period Weeks    Status On-going    Target Date 05/17/21      SLP SHORT TERM GOAL #5   Title Pt will complete formal cognitive linguistic assessment as needed (goals to be added at that time)    Status Achieved    Target Date 05/17/21              SLP Long Term Goals - 05/02/21 1608       SLP LONG TERM GOAL #1   Title Pt will effectively use learned compensations to aid speech fluency in 20-30 min mod complex conversation given rare min A over 2 sessions    Time 6    Period Weeks    Status On-going    Target Date 05/31/21      SLP LONG TERM GOAL #2   Title 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 with reduced frequency of pausing subjectively reported over 2 sessions    Time 8    Period Weeks    Status On-going    Target Date 06/14/21      SLP LONG TERM GOAL #3   Title Pt will report less frustration and improved communciation effectiveness via PROM by 2 points at last ST session    Time 8    Period Weeks    Status On-going    Target Date 06/14/21      SLP LONG TERM GOAL #4   Title Pt will ID and correct errors on structured tasks with 80% accuracy given rare min A over 2 sessions    Time 8    Period Weeks    Status On-going    Target Date 06/14/21              Plan - 05/02/21 1613     Clinical Impression Statement "Barbara Jacobs" was referred for OPST secondary to CVA in September 2022. SLP targeted apraxia and dysarthria for mock presentation of school assignment, with rare pausing exhibited. SLP educated and instructed anomia compesnations this session, as rare pauses for word finding noted. Pt able to verbalize targeted word with mild extra time. See "skilled  treatment" for additional details of today's session. Skilled ST is warranted to address apraxia, dysarthria, and cognition impacting pt's ability to  return to PLOF.    Speech Therapy Frequency 2x / week    Duration 8 weeks    Treatment/Interventions Compensatory strategies;Functional tasks;Patient/family education;Cognitive reorganization;Language facilitation;Compensatory techniques;Internal/external aids;SLP instruction and feedback;Multimodal communcation approach    Potential to Achieve Goals Good    SLP Home Exercise Plan provided    Consulted and Agree with Plan of Care Patient             Patient will benefit from skilled therapeutic intervention in order to improve the following deficits and impairments:   Verbal apraxia  Dysarthria and anarthria  Cognitive communication deficit    Problem List Patient Active Problem List   Diagnosis Date Noted   Acute ischemic stroke (HCC) 04/05/2021   Obesity, Class II, BMI 35-39.9, with comorbidity 02/13/2016   Hypertriglyceridemia 02/13/2016   Poorly controlled type 2 diabetes mellitus with circulatory disorder (HCC) 03/30/2015   CAD (coronary artery disease) 05/04/2014   Hyperlipidemia 08/31/2012   HTN (hypertension) 08/31/2012   PMDD (premenstrual dysphoric disorder) 08/31/2012    Janann Colonel, MA CCC-SLP 05/02/2021, 4:17 PM  [facility] [address] [place], [place], [address] Phone: [phone]   Fax:  [phone]   Name: Barbara Jacobs MRN: [id number] Date of Birth: May 17, 1971

## 2021-05-07 ENCOUNTER — Other Ambulatory Visit: Payer: Self-pay

## 2021-05-07 ENCOUNTER — Ambulatory Visit: Payer: BC Managed Care – PPO | Attending: Family Medicine

## 2021-05-07 DIAGNOSIS — R471 Dysarthria and anarthria: Secondary | ICD-10-CM | POA: Diagnosis not present

## 2021-05-07 DIAGNOSIS — R482 Apraxia: Secondary | ICD-10-CM | POA: Diagnosis present

## 2021-05-07 DIAGNOSIS — R41841 Cognitive communication deficit: Secondary | ICD-10-CM | POA: Insufficient documentation

## 2021-05-07 NOTE — Therapy (Addendum)
[place] [address] [place], [place], [address] Phone: [phone]   Fax:  [phone]  Speech Language Pathology Treatment/Discharge Summary  Patient Details  Name: Barbara Jacobs MRN: [id number] Date of Birth: Apr 15, 1971 Referring Provider (SLP): Mariel Aloe, MD Bartholome Bill MD (PCP-doc))   Encounter Date: 05/07/2021   End of Session - 05/07/21 1539     Visit Number 6    Number of Visits 17    Date for SLP Re-Evaluation 06/14/21    Authorization Type BCBS    SLP Start Time 4034    SLP Stop Time  39    SLP Time Calculation (min) 30 min    Activity Tolerance Patient tolerated treatment well             Past Medical History:  Diagnosis Date   CAD (coronary artery disease) 05/04/2014   Coronary artery disease    a. inferior STEMI 03/2009 s/p BMS to prox PDA. b. NSTEMI 04/2014: mild diffuse nonobstructive CAD, continued patency of previously placed PDA, no clear culprit. Amlodipine and Plavix added.   H/O medication noncompliance    HTN (hypertension) 08/31/2012   Hyperlipidemia    Hypertension    Hypertriglyceridemia 02/13/2016   Morbid obesity ([place])    Myocardial infarction ([place])    Obesity, Class II, BMI 35-39.9, with comorbidity 02/13/2016   PMDD (premenstrual dysphoric disorder) 08/31/2012   Poorly controlled type 2 diabetes mellitus with circulatory disorder ([place]) 03/30/2015   Type 2 diabetes mellitus with vascular disease [facility])     Past Surgical History:  Procedure Laterality Date   CORONARY STENT PLACEMENT     LEFT HEART CATHETERIZATION WITH CORONARY ANGIOGRAM N/A 04/17/2014   Procedure: LEFT HEART CATHETERIZATION WITH CORONARY ANGIOGRAM;  Surgeon: Blane Ohara, MD;  Location: [facility] CATH LAB;  Service: Cardiovascular;  Laterality: N/A;    There were no vitals filed for this visit.   Subjective Assessment - 05/07/21 1532     Subjective "fine"    Currently in Pain? No/denies              SPEECH THERAPY DISCHARGE SUMMARY  Visits from Start of Care: 6  Current functional level related to goals / functional outcomes: Tykira reports speech has nearly returned to baseline and pt is pleased with current progress and requested ST discharge this date. Pt exhibits improved fluency and fluidity in conversation with minimal pausing. Pt verbalized understanding of CLQT results, in which pt reported some of the observed deficits are related to baseline functioning (never paid attention to detail) and pt does not have cognitive concerns related to work or home. Pt verbalized understanding and agreement with ST discharge. Pt denied any further questions.     Remaining deficits: Rare dysfluency in conversation and mild cognitive deficits   Education / Equipment: Fluency compensations, functional recommendations for classroom, anomia compensations   Patient agrees to discharge. Patient goals were partially met. Patient is being discharged due to being pleased with the current functional level..         ADULT SLP TREATMENT - 05/07/21 1534       General Information   Behavior/Cognition Alert;Cooperative      Treatment Provided   Treatment provided Cognitive-Linquistic      Cognitive-Linquistic Treatment   Treatment focused on Cognition;Apraxia;Dysarthria    Skilled Treatment Pt reports speech is nearing baseline, with no further concerns reported for communication or cognition at this time. "Losing the word" is reportedly happening "so infrequently." Pt stated "  I really feel like I'm going to be okay" and requested ST discharge at this date. In conversation, minimal pausing noted. Improved fluency and fluidity exhibited. Pt benefits from slow rate. Pt is pleased with current progress and requested ST discharge at this time.      Assessment / Recommendations / Plan   Plan Discharge SLP treatment due to (comment)   pt is pleased with current progress     Progression  Toward Goals   Progression toward goals Goals partially met, education completed, patient discharged from [employer] - 05/07/21 [place]     Education Details discharge summary, recommendations s/p ST discharge    Person(s) Educated Patient    Methods Explanation;Demonstration    Comprehension Verbalized understanding;Returned demonstration              SLP Short Term Goals - 05/07/21 1539       SLP SHORT TERM GOAL #1   Title Pt will complete HEP given rare min A over 2 sessions    Baseline 05-02-21, 05-07-21    Status Achieved    Target Date 05/17/21      SLP SHORT TERM GOAL #2   Title Pt will effectively use learned compensations to aid speech fluency in 10-15 min mod complex conversation given rare min A over 2 sessions    Baseline 04-30-21, 05-07-21    Status Achieved    Target Date 05/17/21      SLP SHORT TERM GOAL #3   Title 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 with reduced frequency of pausing subjectively reported over 2 sessions    Baseline 05-02-21, 05-07-21    Status Achieved    Target Date 05/17/21      SLP SHORT TERM GOAL #4   Title Pt will report ability to effectively participate in simple telephone conversations with friends or family with use of compensations and rare extra time over 2 sessions    Baseline 05-02-21, 05-07-21    Status Achieved    Target Date 05/17/21      SLP SHORT TERM GOAL #5   Title Pt will complete formal cognitive linguistic assessment as needed (goals to be added at that time)    Status Achieved    Target Date 05/17/21              SLP Long Term Goals - 05/07/21 1541       SLP LONG TERM GOAL #1   Title Pt will effectively use learned compensations to aid speech fluency in 20-30 min mod complex conversation given rare min A over 2 sessions    Baseline 05-07-21    Status Partially Met      SLP LONG TERM GOAL #2   Title 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 with reduced frequency of pausing subjectively reported over 2 sessions    Baseline 05-07-21    Status Partially Met      SLP LONG TERM GOAL #3   Title Pt will report less frustration and improved communciation effectiveness via PROM by 2 points at last ST session    Time --    Period --    Status Deferred      SLP LONG TERM GOAL #4   Title Pt will ID and correct errors on structured tasks with 80% accuracy given rare min A over 2 sessions    Time --  Period --    Status Not Met              Plan - 05/07/21 1550     Clinical Impression Statement "Karlisa" was referred for OPST secondary to CVA in September 2022. Pt reports she is pleased with current progress and requested ST discharge this date. Rare pausing exhibited in conversation, with improved fluency and fluidity of speech exhibited. Pt denied concerns for cognitive lingusitic findings via CLQT in last ST sessions, as pt is reportedly functioning at work and home without difficulty or errors. Pt verbalized understanding and agreement with ST discharge. Pt denied further questions.    Treatment/Interventions Compensatory strategies;Functional tasks;Patient/family education;Cognitive reorganization;Language facilitation;Compensatory techniques;Internal/external aids;SLP instruction and feedback;Multimodal communcation approach    Potential to Achieve Goals Good    SLP Home Exercise Plan provided    Consulted and Agree with Plan of Care Patient             Patient will benefit from skilled therapeutic intervention in order to improve the following deficits and impairments:   Dysarthria and anarthria  Verbal apraxia  Cognitive communication deficit    Problem List Patient Active Problem List   Diagnosis Date Noted   Acute ischemic stroke ([place]) 04/05/2021   Obesity, Class II, BMI 35-39.9, with comorbidity 02/13/2016   Hypertriglyceridemia 02/13/2016   Poorly controlled type 2 diabetes mellitus  with circulatory disorder ([place]) 03/30/2015   CAD (coronary artery disease) 05/04/2014   Hyperlipidemia 08/31/2012   HTN (hypertension) 08/31/2012   PMDD (premenstrual dysphoric disorder) 08/31/2012    Alinda Deem, MA CCC-SLP 05/07/2021, 4:02 PM  [place] [address] [place], [place], [address] Phone: [phone]   Fax:  [phone]   Name: YOULANDA TOMASSETTI MRN: [id number] Date of Birth: [birth year]

## 2021-05-09 ENCOUNTER — Ambulatory Visit: Payer: BC Managed Care – PPO

## 2021-06-20 ENCOUNTER — Encounter: Payer: Self-pay | Admitting: Adult Health

## 2021-06-20 ENCOUNTER — Ambulatory Visit: Payer: BC Managed Care – PPO | Admitting: Adult Health

## 2021-06-20 ENCOUNTER — Other Ambulatory Visit: Payer: Self-pay

## 2021-06-20 VITALS — BP 142/91 | HR 94 | Ht 65.0 in | Wt 249.0 lb

## 2021-06-20 DIAGNOSIS — I6381 Other cerebral infarction due to occlusion or stenosis of small artery: Secondary | ICD-10-CM | POA: Diagnosis not present

## 2021-06-20 DIAGNOSIS — E785 Hyperlipidemia, unspecified: Secondary | ICD-10-CM | POA: Diagnosis not present

## 2021-06-20 DIAGNOSIS — E1159 Type 2 diabetes mellitus with other circulatory complications: Secondary | ICD-10-CM | POA: Diagnosis not present

## 2021-06-20 DIAGNOSIS — G441 Vascular headache, not elsewhere classified: Secondary | ICD-10-CM

## 2021-06-20 DIAGNOSIS — I69320 Aphasia following cerebral infarction: Secondary | ICD-10-CM

## 2021-06-20 DIAGNOSIS — E1165 Type 2 diabetes mellitus with hyperglycemia: Secondary | ICD-10-CM

## 2021-06-20 DIAGNOSIS — I10 Essential (primary) hypertension: Secondary | ICD-10-CM | POA: Diagnosis not present

## 2021-06-20 MED ORDER — TOPIRAMATE 25 MG PO TABS: ORAL_TABLET | ORAL | 5 refills | Status: DC

## 2021-06-20 NOTE — Progress Notes (Signed)
[facility] [address] [place]. [place] [address] [phone]       Barbara Jacobs Date of Birth:  [birth year] Medical Record Number:  [id number]   Reason for Referral:  hospital stroke follow up    SUBJECTIVE:   CHIEF COMPLAINT:  Chief Complaint  Patient presents with   New Patient (Initial Visit)    RM 3, alone. Denies new issues since d/c.     HPI:   [name] is a 50 y.o. female with history of CAD, hypertension, hyperlipidemia, prior MI, obesity, diabetes with vascular disease who presented with 04/05/2021 with speech apraxia and recent right hand clumsiness.  Personally reviewed hospitalization pertinent progress notes, lab work and imaging.  Evaluated by Dr. Erlinda Hong for large left basal ganglia stroke likely secondary to small vessel disease however possibly embolic infarct given size.  MRA head/neck unremarkable.  CTA neck unremarkable.  LE Dopplers unremarkable.  TCD bubble negative PFO.  2D echo EF 60 to 65%.  Recommended completing 30-day cardiac event monitor to rule out A. fib.  LDL 78.  A1c 8.6.  UDS negative.  Recommended DAPT for 3 weeks then aspirin alone.  HTN stable on high end.  Recommended continuation of atorvastatin 80 mg daily.  Advised to follow-up with PCP for better DM control.  Other stroke risk factors include EtOH use, morbid obesity, and CAD/MI.  Incidental finding of cerebral aneurysm and recommended follow-up with IR outpatient.  Therapy eval's recommended outpatient PT/OT.   Today, 06/20/2021, patient being seen for initial hospital follow-up unaccompanied.   Overall doing well since discharge.  Reports occasional word finding difficulty more so when trying to speak too quickly.  She has since completed SLP.  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 Reports headaches since her stroke - located frontal bilateral - at times will be awoken in the middle of the night and whole head pounding.  Feels like pressure/tightness. Occurs a few times a week. Has not tried any OTC. Usually present in the evening with increased fatigue.  No prior history of headaches or migraines.  Denies photophobia, phonophobia or N/V.  Denies new stroke/TIA symptoms  Compliant on aspirin and atorvastatin -denies side effects Completed 3 week course of Plavix Blood pressure today 142/91 Plans on starting cardiac monitor soon to rule out A. fib Plans on repeat A1c 12/21 -monitors glucose levels at home which have been stable  No further concerns at this time      PERTINENT IMAGING/LABS  MRI head - large acute ischemia in the left BG and CR No hemorrhage or mass effect.  MRA head and neck - negative CTA Neck - unremarkable Bilateral LE Venous Dopplers - no DVT 2D Echo EF 60-65% TCD bubble study negative for PFO Recommend 30 day cardiac event monitoring to rule out A. Fib as outpt [employer] Virus 2 - negative LDL - 78 HgbA1c - 8.6    ROS:   14 system review of systems performed and negative with exception of those listed in HPI.  PMH:  Past Medical History:  Diagnosis Date   CAD (coronary artery disease) 05/04/2014   Coronary artery disease    a. inferior STEMI 03/2009 s/p BMS to prox PDA. b. NSTEMI 04/2014: mild diffuse nonobstructive CAD, continued patency of previously placed PDA, no clear culprit. Amlodipine and Plavix added.   H/O medication noncompliance    HTN (hypertension) 08/31/2012   Hyperlipidemia    Hypertension  Hypertriglyceridemia 02/13/2016   Morbid obesity ([place])    Myocardial infarction ([place])    Obesity, Class II, BMI 35-39.9, with comorbidity 02/13/2016   PMDD (premenstrual dysphoric disorder) 08/31/2012   Poorly controlled type 2 diabetes mellitus with circulatory disorder ([place]) 03/30/2015   Type 2 diabetes mellitus with vascular disease ([place])     PSH:  Past Surgical History:  Procedure Laterality Date   CORONARY STENT PLACEMENT     LEFT HEART CATHETERIZATION  WITH CORONARY ANGIOGRAM N/A 04/17/2014   Procedure: LEFT HEART CATHETERIZATION WITH CORONARY ANGIOGRAM;  Surgeon: Blane Ohara, MD;  Location: [facility] CATH LAB;  Service: Cardiovascular;  Laterality: N/A;    Social History:  Social History   Socioeconomic History   Marital status: Single    Spouse name: Not on file   Number of children: Not on file   Years of education: Not on file   Highest education level: Not on file  Occupational History   Not on file  Tobacco Use   Smoking status: Never   Smokeless tobacco: Never  Vaping Use   Vaping Use: Never used  Substance and Sexual Activity   Alcohol use: Yes    Comment: pt drinks twice a month   Drug use: No   Sexual activity: Yes    Birth control/protection: Pill, Condom  Other Topics Concern   Not on file  Social History Narrative   Not on file   Social Determinants of Health   Financial Resource Strain: Not on file  Food Insecurity: Not on file  Transportation Needs: Not on file  Physical Activity: Not on file  Stress: Not on file  Social Connections: Not on file  Intimate Partner Violence: Not on file    Family History:  Family History  Problem Relation Age of Onset   Diabetes Mother    Hypertension Mother    Breast cancer Mother    Diabetes Father    COPD Father    Hypertension Father    Hypertension Sister    Kidney disease Sister    Diabetes Maternal Grandmother    Hypertension Maternal Grandmother    Heart disease Maternal Grandmother    Hypertension Paternal Grandmother    Diabetes Paternal Grandfather    Heart attack Neg Hx    Stroke Neg Hx     Medications:   Current Outpatient Medications on File Prior to Visit  Medication Sig Dispense Refill   ALPRAZolam (XANAX) 1 MG tablet Take 1 mg by mouth daily as needed for anxiety.     aspirin 81 MG chewable tablet Chew 1 tablet (81 mg total) by mouth daily. 90 tablet 0   atorvastatin (LIPITOR) 80 MG tablet Take 1 tablet (80 mg total) by mouth daily at 6  PM. 30 tablet 5   carvedilol (COREG) 6.25 MG tablet Take 1 tablet (6.25 mg total) by mouth 2 (two) times daily with a meal. 60 tablet 6   glipiZIDE (GLUCOTROL) 5 MG tablet TAKE ONE TABLET BY MOUTH TWICE DAILY BEFORE A MEAL (Patient taking differently: Take 5 mg by mouth 2 (two) times daily before a meal.) 180 tablet 1   Insulin Pen Needle (FIFTY50 PEN NEEDLES) 32G X 4 MM MISC Use 1x a day 100 each 1   lisinopril (PRINIVIL,ZESTRIL) 40 MG tablet Take 1 tablet (40 mg total) by mouth daily. 30 tablet 6   metFORMIN (GLUCOPHAGE) 1000 MG tablet TAKE ONE TABLET BY MOUTH TWICE DAILY WITH A MEAL (Patient taking differently: Take 1,000 mg by mouth 2 (two)  times daily with a meal.) 180 tablet 1   Multiple Vitamins-Minerals (MULTIVITAMIN WITH MINERALS) tablet Take 1 tablet by mouth daily.     sertraline (ZOLOFT) 50 MG tablet Take 150 mg by mouth daily.     TRULICITY 3 MG/0.5ML SOPN Inject 3 mg into the skin once a week. Mondays     zolpidem (AMBIEN) 10 MG tablet Take 1 tablet (10 mg total) by mouth at bedtime as needed. for sleep (Patient taking differently: Take 10 mg by mouth at bedtime as needed for sleep. for sleep) 30 tablet 1   No current facility-administered medications on file prior to visit.    Allergies:   Allergies  Allergen Reactions   Ibuprofen Swelling    Throat swells shut   Naproxen     Swell up and throat swells shut   Pineapple    Shellfish Allergy Hives, Itching and Swelling      OBJECTIVE:  Physical Exam  Vitals:   06/20/21 1406  BP: (!) 142/91  Pulse: 94  Weight: 249 lb (112.9 kg)  Height: 5\' 5"  (1.651 m)   Body mass index is 41.44 kg/m. No results found.  Post stroke PHQ 2/9 Depression screen PHQ 2/9 06/20/2021  Decreased Interest 0  Down, Depressed, Hopeless 0  PHQ - 2 Score 0     General: well developed, well nourished, very pleasant middle-aged African-American female, seated, in no evident distress Head: head normocephalic and atraumatic.   Neck:  supple with no carotid or supraclavicular bruits Cardiovascular: regular rate and rhythm, no murmurs Musculoskeletal: no deformity Skin:  no rash/petichiae Vascular:  Normal pulses all extremities   Neurologic Exam Mental Status: Awake and fully alert.  Occasional speech hesitancy.  No evidence of dysarthria.  Oriented to place and time. Recent and remote memory intact. Attention span, concentration and fund of knowledge appropriate. Mood and affect appropriate.  Cranial Nerves: Fundoscopic exam reveals sharp disc margins. Pupils equal, briskly reactive to light. Extraocular movements full without nystagmus. Visual fields full to confrontation. Hearing intact. Facial sensation intact. Face, tongue, palate moves normally and symmetrically.  Motor: Normal bulk and tone. Normal strength in all tested extremity muscles Sensory.: intact to touch , pinprick , position and vibratory sensation.  Coordination: Rapid alternating movements normal in all extremities. Finger-to-nose and heel-to-shin performed accurately bilaterally. Gait and Station: Arises from chair without difficulty. Stance is normal. Gait demonstrates normal stride length and balance without use of AD. Tandem walk and heel toe without difficulty.  Reflexes: 1+ and symmetric. Toes downgoing.     NIHSS  1 Modified Rankin  1      ASSESSMENT: Barbara Jacobs is a 50 y.o. year old female with large left basal ganglia stroke on 04/05/2021 likely secondary to small vessel disease however embolic source cannot be ruled out given size. Vascular risk factors include CAD, HTN, HLD, prior MI, DM with vascular disease and obesity.      PLAN:  L BG stroke :  Residual deficit: Mild occasional aphasia.  Completed therapy.  Discussed typical recovery time Post stroke headache: Start topiramate 25 mg nightly for 7 days then increase to 50 mg nightly thereafter.  Discussed potential side effects and to call with any concerns or call after  few weeks if no benefit Complete 30-day cardiac event monitor Continue aspirin 81 mg daily  and atorvastatin 80 mg daily for secondary stroke prevention.  Discussed secondary stroke prevention measures and importance of close PCP follow up for aggressive stroke risk factor management. I  have gone over the pathophysiology of stroke, warning signs and symptoms, risk factors and their management in some detail with instructions to go to the closest emergency room for symptoms of concern. HTN: BP goal <130/90.  Stable on current regimen per PCP HLD: LDL goal <70. Recent LDL 78 on atorvastatin 80 mg daily per PCP.  DMII: A1c goal<7.0. Recent A1c 8.6.  Monitored/managed by PCP    Follow up in 4 months or call earlier if needed   CC:  [facility] provider: Dr. Leonie Man PCP: Bartholome Bill, MD    I spent 57 minutes of face-to-face and non-face-to-face time with patient.  This included previsit chart review including review of recent hospitalization, lab review, study review, order entry, electronic health record documentation, patient education regarding recent stroke including etiology, secondary stroke prevention measures and importance of managing stroke risk factors, residual deficits and typical recovery time and answered all other questions to patient satisfaction  Frann Rider, AGNP-BC  [facility] [address] [place], [place] [address]  Phone [phone] Fax [phone] Note: This document was prepared with digital dictation and possible smart phrase technology. Any transcriptional errors that result from this process are unintentional.

## 2021-06-20 NOTE — Patient Instructions (Addendum)
Start topamax 25mg  nightly for 7 days then increase to 50mg  nightly for headache - if you continue to experience headaches after a couple weeks or any difficulty tolerating, please let me know  Complete cardiac monitor as advised by cardiology   Continue aspirin 81 mg daily  and atorvastatin 80mg  daily  for secondary stroke prevention  Continue to follow up with PCP regarding cholesterol, blood pressure and diabetes management  Maintain strict control of hypertension with blood pressure goal below 130/90, diabetes with hemoglobin A1c goal below 7% and cholesterol with LDL cholesterol (bad cholesterol) goal below 70 mg/dL.       Followup in the future with me in 4 months or call earlier if needed       Thank you for coming to see at [facility]. I hope we have been able to provide you high quality care today.  You may receive a patient satisfaction survey over the next few weeks. We would appreciate your feedback and comments so that we may continue to improve ourselves and the health of our patients.   Stroke Prevention Some medical conditions and lifestyle choices can lead to a higher risk for a stroke. You can help to prevent a stroke by eating healthy foods and exercising. It also helps to not smoke and to manage any health problems you may have. How can this condition affect me? A stroke is an emergency. It should be treated right away. A stroke can lead to brain damage or threaten your life. There is a better chance of surviving and getting better after a stroke if you get medical help right away. What can increase my risk? The following medical conditions may increase your risk of a stroke: Diseases of the heart and blood vessels (cardiovascular disease). High blood pressure (hypertension). Diabetes. High cholesterol. Sickle cell disease. Problems with blood clotting. Being very overweight. Sleeping problems (obstructivesleep apnea). Other risk  factors include: Being older than age 60. A history of blood clots, stroke, or mini-stroke (TIA). Race, ethnic background, or a family history of stroke. Smoking or using tobacco products. Taking birth control pills, especially if you smoke. Heavy alcohol and drug use. Not being active. What actions can I take to prevent this? Manage your health conditions High cholesterol. Eat a healthy diet. If this is not enough to manage your cholesterol, you may need to take medicines. Take medicines as told by your doctor. High blood pressure. Try to keep your blood pressure below 130/80. If your blood pressure cannot be managed through a healthy diet and regular exercise, you may need to take medicines. Take medicines as told by your doctor. Ask your doctor if you should check your blood pressure at home. Have your blood pressure checked every year. Diabetes. Eat a healthy diet and get regular exercise. If your blood sugar (glucose) cannot be managed through diet and exercise, you may need to take medicines. Take medicines as told by your doctor. Talk to your doctor about getting checked for sleeping problems. Signs of a problem can include: Snoring a lot. Feeling very tired. Make sure that you manage any other conditions you have. Nutrition  Follow instructions from your doctor about what to eat or drink. You may be told to: Eat and drink fewer calories each day. Limit how much salt (sodium) you use to 1,500 milligrams (mg) each day. Use only healthy fats for cooking, such as olive oil, canola oil, and sunflower oil. Eat healthy foods. To do this: Choose foods  that are high in fiber. These include whole grains, and fresh fruits and vegetables. Eat at least 5 servings of fruits and vegetables a day. Try to fill one-half of your plate with fruits and vegetables at each meal. Choose low-fat (lean) proteins. These include low-fat cuts of meat, chicken without skin, fish, tofu, beans, and  nuts. Eat low-fat dairy products. Avoid foods that: Are high in salt. Have saturated fat. Have trans fat. Have cholesterol. Are processed or pre-made. Count how many carbohydrates you eat and drink each day. Lifestyle If you drink alcohol: Limit how much you have to: 0-1 drink a day for women who are not pregnant. 0-2 drinks a day for men. Know how much alcohol is in your drink. In the [place] one drink equals one 12 oz bottle of beer ( ), one 5 oz glass of wine ( ), or one 1 oz glass of hard liquor (58mL). Do not smoke or use any products that have nicotine or tobacco. If you need help quitting, ask your doctor. Avoid secondhand smoke. Do not use drugs. Activity  Try to stay at a healthy weight. Get at least 30 minutes of exercise on most days, such as: Fast walking. Biking. Swimming. Medicines Take over-the-counter and prescription medicines only as told by your doctor. Avoid taking birth control pills. Talk to your doctor about the risks of taking birth control pills if: You are over 1 years old. You smoke. You get very bad headaches. You have had a blood clot. Where to find more information American Stroke Association: www.strokeassociation.org Get help right away if: You or a loved one has any signs of a stroke. "BE FAST" is an easy way to remember the warning signs: B - Balance. Dizziness, sudden trouble walking, or loss of balance. E - Eyes. Trouble seeing or a change in how you see. F - Face. Sudden weakness or loss of feeling of the face. The face or eyelid may droop on one side. A - Arms. Weakness or loss of feeling in an arm. This happens all of a sudden and most often on one side of the body. S - Speech. Sudden trouble speaking, slurred speech, or trouble understanding what people say. T - Time. Time to call emergency services. Write down what time symptoms started. You or a loved one has other signs of a stroke, such as: A sudden, very bad headache with  no known cause. Feeling like you may vomit (nausea). Vomiting. A seizure. These symptoms may be an emergency. Get help right away. Call your local emergency services (911 in the [place] Do not wait to see if the symptoms will go away. Do not drive yourself to the hospital. Summary You can help to prevent a stroke by eating healthy, exercising, and not smoking. It also helps to manage any health problems you have. Do not smoke or use any products that contain nicotine or tobacco. Get help right away if you or a loved one has any signs of a stroke. This information is not intended to replace advice given to you by your health care provider. Make sure you discuss any questions you have with your health care provider. Document Revised: 02/20/2020 Document Reviewed: 02/20/2020 Elsevier Patient Education  2022 [employer].

## 2021-07-05 NOTE — Progress Notes (Signed)
Event Monitor order will be cancelled patient never applied the monitor

## 2021-10-24 ENCOUNTER — Encounter: Payer: Self-pay | Admitting: Adult Health

## 2021-10-24 ENCOUNTER — Ambulatory Visit: Payer: BC Managed Care – PPO | Admitting: Adult Health

## 2021-10-24 ENCOUNTER — Other Ambulatory Visit: Payer: Self-pay

## 2021-10-24 VITALS — BP 120/86 | HR 85 | Ht 66.0 in | Wt 233.0 lb

## 2021-10-24 DIAGNOSIS — R251 Tremor, unspecified: Secondary | ICD-10-CM | POA: Diagnosis not present

## 2021-10-24 DIAGNOSIS — G441 Vascular headache, not elsewhere classified: Secondary | ICD-10-CM | POA: Diagnosis not present

## 2021-10-24 DIAGNOSIS — I6381 Other cerebral infarction due to occlusion or stenosis of small artery: Secondary | ICD-10-CM

## 2021-10-24 DIAGNOSIS — R2689 Other abnormalities of gait and mobility: Secondary | ICD-10-CM | POA: Diagnosis not present

## 2021-10-24 NOTE — Patient Instructions (Signed)
Will obtain an MR brain to evaluate for any underlying causes potentially contributing to balance ? ?Will complete an EEG to rule out any seizures  ? ?Ensure you restart aspirin 81 mg daily  and atorvastatin  for secondary stroke prevention ? ?Continue to follow up with PCP regarding cholesterol, diabetes and blood pressure management  ?Maintain strict control of hypertension with blood pressure goal below 130/90, diabetes with hemoglobin A1c goal below 7.0 % and cholesterol with LDL cholesterol (bad cholesterol) goal below 70 mg/dL.  ? ?Signs of a Stroke? Follow the BEFAST method:  ?Balance Watch for a sudden loss of balance, trouble with coordination or vertigo ?Eyes Is there a sudden loss of vision in one or both eyes? Or double vision?  ?Face: Ask the person to smile. Does one side of the face droop or is it numb?  ?Arms: Ask the person to raise both arms. Does one arm drift downward? Is there weakness or numbness of a leg? ?Speech: Ask the person to repeat a simple phrase. Does the speech sound slurred/strange? Is the person confused ? ?Time: If you observe any of these signs, call 911. ? ? ? ? ?Followup in the future with me in 4 months or call earlier if needed ? ? ? ? ? ? ?Thank you for coming to see [place] at [facility]. I hope we have been able to provide you high quality care today. ? ?You may receive a patient satisfaction survey over the next few weeks. We would appreciate your feedback and comments so that we may continue to improve ourselves and the health of our patients. ? ?

## 2021-10-24 NOTE — Progress Notes (Signed)
?[facility] ?[id number] ?[place]. [place] [address] ?[phone] ? ?     STROKE FOLLOW UP NOTE ? ?[name] ?Date of Birth:  1970/09/15 ?Medical Record Number:  [id number]  ? ?Reason for Referral: stroke follow up ? ? ? ?SUBJECTIVE: ? ? ?CHIEF COMPLAINT:  ?Chief Complaint  ?Patient presents with  ? Follow-up  ?  RM 3 with spouse Barbara Jacobs ?Pt is well, states she has been having tremors and imbalance.   ? ? ? ?HPI:  ? ?Update 10/24/2021 JM: Patient returns for 68-month stroke follow-up accompanied by her husband, Barbara Jacobs.  She mentions over the past month she has had great difficulty with her balance and feeling unsteady.  This has been gradually worsening since onset.  Denies any lightheadedness, dizziness, vertigo, headache, vision changes, weakness, numbness or speech changes. Denies any neck pain. Thankfully, she has not had any falls. She also reports about 2-3 weeks ago, she woke up in the middle of the night to her right hand shaking and would move up to mid forearm, this would last for approx 1 min then stop. She is not able to stop the shaking when this happens even with her opposite hand.  No shaking in left hand, legs or head. No altered mental status, confusion or loss of consciousness or headache.  This has occurred a few more times since then. Has not experienced this during the day. She does mention greater difficulty opening jars or lids.  ? ?She does still have occasional word finding difficulty since her prior stroke but no worsening.  She denies any additional headaches since starting topiramate 4 months ago currently taking 50 mg nightly.  Compliant on aspirin and atorvastatin without side effects.  Blood pressure today 120/86.  Routinely checks at home and typically stable.  Routinely monitors glucose levels at home which have been stable.  No further concerns at this time. ? ? ? ? ? ?History provided for reference purposes only ?Initial visit 06/20/2021 JM: Patient  being seen for initial hospital follow-up unaccompanied.  ? ?Overall doing well since discharge.  Reports occasional word finding difficulty more so when trying to speak too quickly.  She has since completed SLP.  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 ?Reports headaches since her stroke - located frontal bilateral - at times will be awoken in the middle of the night and whole head pounding. Feels like pressure/tightness. Occurs a few times a week. Has not tried any OTC. Usually present in the evening with increased fatigue.  No prior history of headaches or migraines.  Denies photophobia, phonophobia or N/V.  ?Denies new stroke/TIA symptoms ? ?Compliant on aspirin and atorvastatin -denies side effects ?Completed 3 week course of Plavix ?Blood pressure today 142/91 ?Plans on starting cardiac monitor soon to rule out A. fib ?Plans on repeat A1c 12/21 -monitors glucose levels at home which have been stable ? ?No further concerns at this time ? ? ?Stroke hospitalization 9022 ?[name] is a 51 y.o. female with history of CAD, hypertension, hyperlipidemia, prior MI, obesity, diabetes with vascular disease who presented with 04/05/2021 with speech apraxia and recent right hand clumsiness.  Personally reviewed hospitalization pertinent progress notes, lab work and imaging.  Evaluated by Dr. Roda Shutters for large left basal ganglia stroke likely secondary to small vessel disease however possibly embolic infarct given size.  MRA head/neck unremarkable.  CTA neck unremarkable.  LE Dopplers unremarkable.  TCD bubble negative PFO.  2D echo EF 60 to  65%.  Recommended completing 30-day cardiac event monitor to rule out A. fib.  LDL 78.  A1c 8.6.  UDS negative.  Recommended DAPT for 3 weeks then aspirin alone.  HTN stable on high end.  Recommended continuation of atorvastatin 80 mg daily.  Advised to follow-up with PCP for better DM control.  Other stroke risk factors include EtOH use, morbid obesity, and CAD/MI.  Incidental  finding of cerebral aneurysm and recommended follow-up with IR outpatient.  Therapy eval's recommended outpatient PT/OT. ? ? ? ? ? ?PERTINENT IMAGING/LABS ? ?MRI head - large acute ischemia in the left BG and CR No hemorrhage or mass effect.  ?MRA head and neck - negative ?CTA Neck - unremarkable ?Bilateral LE Venous Dopplers - no DVT ?2D Echo EF 60-65% ?TCD bubble study negative for PFO ?Recommend 30 day cardiac event monitoring to rule out A. Fib as outpt ?[employer] Virus 2 - negative ?LDL - 78 ?HgbA1c - 8.6 ? ? ? ?ROS:   ?14 system review of systems performed and negative with exception of those listed in HPI. ? ?PMH:  ?Past Medical History:  ?Diagnosis Date  ? CAD (coronary artery disease) 05/04/2014  ? Coronary artery disease   ? a. inferior STEMI 03/2009 s/p BMS to prox PDA. b. NSTEMI 04/2014: mild diffuse nonobstructive CAD, continued patency of previously placed PDA, no clear culprit. Amlodipine and Plavix added.  ? H/O medication noncompliance   ? HTN (hypertension) 08/31/2012  ? Hyperlipidemia   ? Hypertension   ? Hypertriglyceridemia 02/13/2016  ? Morbid obesity (HCC)   ? Myocardial infarction [facility])   ? Obesity, Class II, BMI 35-39.9, with comorbidity 02/13/2016  ? PMDD (premenstrual dysphoric disorder) 08/31/2012  ? Poorly controlled type 2 diabetes mellitus with circulatory disorder (HCC) 03/30/2015  ? Type 2 diabetes mellitus with vascular disease (HCC)   ? ? ?PSH:  ?Past Surgical History:  ?Procedure Laterality Date  ? CORONARY STENT PLACEMENT    ? LEFT HEART CATHETERIZATION WITH CORONARY ANGIOGRAM N/A 04/17/2014  ? Procedure: LEFT HEART CATHETERIZATION WITH CORONARY ANGIOGRAM;  Surgeon: Micheline Chapman, MD;  Location: [facility] CATH LAB;  Service: Cardiovascular;  Laterality: N/A;  ? ? ?Social History:  ?Social History  ? ?Socioeconomic History  ? Marital status: Single  ?  Spouse name: Not on file  ? Number of children: Not on file  ? Years of education: Not on file  ? Highest education level: Not on file   ?Occupational History  ? Not on file  ?Tobacco Use  ? Smoking status: Never  ? Smokeless tobacco: Never  ?Vaping Use  ? Vaping Use: Never used  ?Substance and Sexual Activity  ? Alcohol use: Yes  ?  Comment: pt drinks twice a month  ? Drug use: No  ? Sexual activity: Yes  ?  Birth control/protection: Pill, Condom  ?Other Topics Concern  ? Not on file  ?Social History Narrative  ? Not on file  ? ?Social Determinants of Health  ? ?Financial Resource Strain: Not on file  ?Food Insecurity: Not on file  ?Transportation Needs: Not on file  ?Physical Activity: Not on file  ?Stress: Not on file  ?Social Connections: Not on file  ?Intimate Partner Violence: Not on file  ? ? ?Family History:  ?Family History  ?Problem Relation Age of Onset  ? Diabetes Mother   ? Hypertension Mother   ? Breast cancer Mother   ? Diabetes Father   ? COPD Father   ? Hypertension Father   ?  Hypertension Sister   ? Kidney disease Sister   ? Diabetes Maternal Grandmother   ? Hypertension Maternal Grandmother   ? Heart disease Maternal Grandmother   ? Hypertension Paternal Grandmother   ? Diabetes Paternal Grandfather   ? Heart attack Neg Hx   ? Stroke Neg Hx   ? ? ?Medications:   ?Current Outpatient Medications on File Prior to Visit  ?Medication Sig Dispense Refill  ? ALPRAZolam (XANAX) 1 MG tablet Take 1 mg by mouth daily as needed for anxiety.    ? atorvastatin (LIPITOR) 80 MG tablet Take 1 tablet (80 mg total) by mouth daily at 6 PM. 30 tablet 5  ? carvedilol (COREG) 6.25 MG tablet Take 1 tablet (6.25 mg total) by mouth 2 (two) times daily with a meal. 60 tablet 6  ? glipiZIDE (GLUCOTROL) 5 MG tablet TAKE ONE TABLET BY MOUTH TWICE DAILY BEFORE A MEAL (Patient taking differently: Take 5 mg by mouth 2 (two) times daily before a meal.) 180 tablet 1  ? Insulin Pen Needle (FIFTY50 PEN NEEDLES) 32G X 4 MM MISC Use 1x a day 100 each 1  ? lisinopril (PRINIVIL,ZESTRIL) 40 MG tablet Take 1 tablet (40 mg total) by mouth daily. 30 tablet 6  ? metFORMIN  (GLUCOPHAGE) 1000 MG tablet TAKE ONE TABLET BY MOUTH TWICE DAILY WITH A MEAL (Patient taking differently: Take 1,000 mg by mouth 2 (two) times daily with a meal.) 180 tablet 1  ? Multiple Vitamins-Minerals (M

## 2021-10-24 NOTE — Progress Notes (Signed)
I agree with the above plan 

## 2021-10-28 ENCOUNTER — Telehealth: Payer: Self-pay | Admitting: Adult Health

## 2021-10-28 NOTE — Telephone Encounter (Signed)
MR Brain wo contrast Barbara Jacobs, [employer] Berkley Harvey: [id number] (exp. 10/28/21 to 11/26/21)  ? ?Patient is scheduled at [facility] for 11/05/21.Marland KitchenShe informed me that she is claustrophobic and would need something to help her. She is aware to have a driver.  ?

## 2021-10-28 NOTE — Telephone Encounter (Signed)
She is currently prescribed Xanax by her PCP - she can use this medication prior to MRI.  ?

## 2021-11-05 ENCOUNTER — Ambulatory Visit: Payer: BC Managed Care – PPO

## 2021-11-05 DIAGNOSIS — I6381 Other cerebral infarction due to occlusion or stenosis of small artery: Secondary | ICD-10-CM

## 2021-11-11 ENCOUNTER — Ambulatory Visit: Payer: BC Managed Care – PPO | Admitting: Neurology

## 2021-11-11 DIAGNOSIS — R251 Tremor, unspecified: Secondary | ICD-10-CM

## 2021-11-11 DIAGNOSIS — I6381 Other cerebral infarction due to occlusion or stenosis of small artery: Secondary | ICD-10-CM

## 2022-01-15 ENCOUNTER — Encounter: Payer: Self-pay | Admitting: *Deleted

## 2022-01-15 DIAGNOSIS — Z006 Encounter for examination for normal comparison and control in clinical research program: Secondary | ICD-10-CM

## 2022-01-15 NOTE — Research (Unsigned)
Message left for Barbara Jacobs about essence research. Encouraged her to call with any questions.

## 2022-02-24 ENCOUNTER — Encounter: Payer: Self-pay | Admitting: Adult Health

## 2022-02-24 ENCOUNTER — Other Ambulatory Visit: Payer: Self-pay | Admitting: Adult Health

## 2022-02-24 ENCOUNTER — Ambulatory Visit: Payer: BC Managed Care – PPO | Admitting: Adult Health

## 2022-02-24 VITALS — BP 165/100 | HR 92 | Ht 66.0 in | Wt 227.0 lb

## 2022-02-24 DIAGNOSIS — I6381 Other cerebral infarction due to occlusion or stenosis of small artery: Secondary | ICD-10-CM

## 2022-02-24 DIAGNOSIS — I4891 Unspecified atrial fibrillation: Secondary | ICD-10-CM

## 2022-02-24 DIAGNOSIS — I639 Cerebral infarction, unspecified: Secondary | ICD-10-CM

## 2022-02-24 NOTE — Progress Notes (Signed)
[facility] [address] [place]. [place] [address] [phone]       STROKE FOLLOW UP NOTE  [name] Date of Birth:  May 10, 1971 Medical Record Number:  [id number]   Reason for Referral: stroke follow up    SUBJECTIVE:   CHIEF COMPLAINT:  Chief Complaint  Patient presents with   Follow-up    Rm 2 alone  Pt is well, balance has improved since last visit. No new concerns      HPI:   Update 02/24/2022 JM: Patient returns for 17-month stroke follow-up.  Prior concerns of worsening balance with repeat MRI negative for acute or new findings.  Since then, balance has greatly improved.  She has not had any additional right hand shaking since prior visit, EEG unremarkable. Denies new stroke/TIA symptoms.  Occasional word finding difficulty with improvement since prior visit.  No recent headaches and has since self discontinued topiramate. Compliant on aspirin and atorvastatin, denies side effects.  Blood pressure today elevated, reports she forgot to take her blood pressure medication last night and this morning (rushing for appt).  Routinely monitors at home and typically stable.  Glucose levels have been stable.  Closely follows with PCP.  No new concerns at this time.      History provided for reference purposes only Update 10/24/2021 JM: Patient returns for 35-month stroke follow-up accompanied by her husband, Barbara Jacobs.  She mentions over the past month she has had great difficulty with her balance and feeling unsteady.  This has been gradually worsening since onset.  Denies any lightheadedness, dizziness, vertigo, headache, vision changes, weakness, numbness or speech changes. Denies any neck pain. Thankfully, she has not had any falls. She also reports about 2-3 weeks ago, she woke up in the middle of the night to her right hand shaking and would move up to mid forearm, this would last for approx 1 min then stop. She is not able to stop the shaking when  this happens even with her opposite hand.  No shaking in left hand, legs or head. No altered mental status, confusion or loss of consciousness or headache.  This has occurred a few more times since then. Has not experienced this during the day. She does mention greater difficulty opening jars or lids.   She does still have occasional word finding difficulty since her prior stroke but no worsening.  She denies any additional headaches since starting topiramate 4 months ago currently taking 50 mg nightly.  Compliant on aspirin and atorvastatin without side effects.  Blood pressure today 120/86.  Routinely checks at home and typically stable.  Routinely monitors glucose levels at home which have been stable.  No further concerns at this time.  Initial visit 06/20/2021 JM: Patient being seen for initial hospital follow-up unaccompanied.   Overall doing well since discharge.  Reports occasional word finding difficulty more so when trying to speak too quickly.  She has since completed SLP.  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 Reports headaches since her stroke - located frontal bilateral - at times will be awoken in the middle of the night and whole head pounding. Feels like pressure/tightness. Occurs a few times a week. Has not tried any OTC. Usually present in the evening with increased fatigue.  No prior history of headaches or migraines.  Denies photophobia, phonophobia or N/V.  Denies new stroke/TIA symptoms  Compliant on aspirin and atorvastatin -denies side effects Completed 3 week course of Plavix Blood pressure today 142/91 Plans  on starting cardiac monitor soon to rule out A. fib Plans on repeat A1c 12/21 -monitors glucose levels at home which have been stable  No further concerns at this time   Stroke hospitalization 9022 [name] is a 51 y.o. female with history of CAD, hypertension, hyperlipidemia, prior MI, obesity, diabetes with vascular disease who presented with  04/05/2021 with speech apraxia and recent right hand clumsiness.  Personally reviewed hospitalization pertinent progress notes, lab work and imaging.  Evaluated by Dr. Roda Shutters for large left basal ganglia stroke likely secondary to small vessel disease however possibly embolic infarct given size.  MRA head/neck unremarkable.  CTA neck unremarkable.  LE Dopplers unremarkable.  TCD bubble negative PFO.  2D echo EF 60 to 65%.  Recommended completing 30-day cardiac event monitor to rule out A. fib.  LDL 78.  A1c 8.6.  UDS negative.  Recommended DAPT for 3 weeks then aspirin alone.  HTN stable on high end.  Recommended continuation of atorvastatin 80 mg daily.  Advised to follow-up with PCP for better DM control.  Other stroke risk factors include EtOH use, morbid obesity, and CAD/MI.  Incidental finding of cerebral aneurysm and recommended follow-up with IR outpatient.  Therapy eval's recommended outpatient PT/OT.      PERTINENT IMAGING/LABS  MRI head - large acute ischemia in the left BG and CR No hemorrhage or mass effect.  MRA head and neck - negative CTA Neck - unremarkable Bilateral LE Venous Dopplers - no DVT 2D Echo EF 60-65% TCD bubble study negative for PFO Recommend 30 day cardiac event monitoring to rule out A. Fib as outpt [employer] Virus 2 - negative LDL - 78 HgbA1c - 8.6    ROS:   14 system review of systems performed and negative with exception of those listed in HPI.  PMH:  Past Medical History:  Diagnosis Date   CAD (coronary artery disease) 05/04/2014   Coronary artery disease    a. inferior STEMI 03/2009 s/p BMS to prox PDA. b. NSTEMI 04/2014: mild diffuse nonobstructive CAD, continued patency of previously placed PDA, no clear culprit. Amlodipine and Plavix added.   H/O medication noncompliance    HTN (hypertension) 08/31/2012   Hyperlipidemia    Hypertension    Hypertriglyceridemia 02/13/2016   Morbid obesity (HCC)    Myocardial infarction (HCC)    Obesity, Class II, BMI  35-39.9, with comorbidity 02/13/2016   PMDD (premenstrual dysphoric disorder) 08/31/2012   Poorly controlled type 2 diabetes mellitus with circulatory disorder (HCC) 03/30/2015   Type 2 diabetes mellitus with vascular disease (HCC)     PSH:  Past Surgical History:  Procedure Laterality Date   CORONARY STENT PLACEMENT     LEFT HEART CATHETERIZATION WITH CORONARY ANGIOGRAM N/A 04/17/2014   Procedure: LEFT HEART CATHETERIZATION WITH CORONARY ANGIOGRAM;  Surgeon: Micheline Chapman, MD;  Location[facility] C[facility] LAB;  Service: Cardiovascular;  Laterality: N/A;    Social History:  Social History   Socioeconomic History   Marital status: Married    Spouse name: Not on file   Number of children: Not on file   Years of education: Not on file   Highest education level: Not on file  Occupational History   Not on file  Tobacco Use   Smoking status: Never   Smokeless tobacco: Never  Vaping Use   Vaping Use: Never used  Substance and Sexual Activity   Alcohol use: Yes    Comment: pt drinks twice a month   Drug use: No  Sexual activity: Yes    Birth control/protection: Pill, Condom  Other Topics Concern   Not on file  Social History Narrative   Not on file   Social Determinants of Health   Financial Resource Strain: Not on file  Food Insecurity: Not on file  Transportation Needs: Not on file  Physical Activity: Not on file  Stress: Not on file  Social Connections: Not on file  Intimate Partner Violence: Not on file    Family History:  Family History  Problem Relation Age of Onset   Diabetes Mother    Hypertension Mother    Breast cancer Mother    Diabetes Father    COPD Father    Hypertension Father    Hypertension Sister    Kidney disease Sister    Diabetes Maternal Grandmother    Hypertension Maternal Grandmother    Heart disease Maternal Grandmother    Hypertension Paternal Grandmother    Diabetes Paternal Grandfather    Heart attack Neg Hx    Stroke Neg Hx      Medications:   Current Outpatient Medications on File Prior to Visit  Medication Sig Dispense Refill   ALPRAZolam (XANAX) 1 MG tablet Take 1 mg by mouth daily as needed for anxiety.     atorvastatin (LIPITOR) 80 MG tablet Take 1 tablet (80 mg total) by mouth daily at 6 PM. 30 tablet 5   carvedilol (COREG) 6.25 MG tablet Take 1 tablet (6.25 mg total) by mouth 2 (two) times daily with a meal. 60 tablet 6   glipiZIDE (GLUCOTROL) 5 MG tablet TAKE ONE TABLET BY MOUTH TWICE DAILY BEFORE A MEAL (Patient taking differently: Take 5 mg by mouth 2 (two) times daily before a meal.) 180 tablet 1   lisinopril (PRINIVIL,ZESTRIL) 40 MG tablet Take 1 tablet (40 mg total) by mouth daily. 30 tablet 6   metFORMIN (GLUCOPHAGE) 1000 MG tablet TAKE ONE TABLET BY MOUTH TWICE DAILY WITH A MEAL (Patient taking differently: Take 1,000 mg by mouth 2 (two) times daily with a meal.) 180 tablet 1   Multiple Vitamins-Minerals (MULTIVITAMIN WITH MINERALS) tablet Take 1 tablet by mouth daily.     OZEMPIC, 2 MG/DOSE, 8 MG/3ML SOPN SMARTSIG:0.75 Milliliter(s) SUB-Q Once a Week     sertraline (ZOLOFT) 50 MG tablet Take 150 mg by mouth daily.     topiramate (TOPAMAX) 25 MG tablet Take 25mg  nightly for 1 week then increase to 50mg  nightly 60 tablet 5   zolpidem (AMBIEN) 10 MG tablet Take 1 tablet (10 mg total) by mouth at bedtime as needed. for sleep (Patient taking differently: Take 10 mg by mouth at bedtime as needed for sleep. for sleep) 30 tablet 1   No current facility-administered medications on file prior to visit.    Allergies:   Allergies  Allergen Reactions   Ibuprofen Swelling    Throat swells shut   Naproxen     Swell up and throat swells shut   Pineapple    Shellfish Allergy Hives, Itching and Swelling      OBJECTIVE:  Physical Exam  Vitals:   02/24/22 1103  BP: (!) 165/100  Pulse: 92  Weight: 227 lb (103 kg)  Height: 5\' 6"  (1.676 m)   Body mass index is 36.64 kg/m. No results  found.   General: well developed, well nourished, very pleasant middle-aged African-American female, seated, in no evident distress Head: head normocephalic and atraumatic.   Neck: supple with no carotid or supraclavicular bruits Cardiovascular: regular rate and rhythm,  no murmurs Musculoskeletal: no deformity Skin:  no rash/petichiae Vascular:  Normal pulses all extremities   Neurologic Exam Mental Status: Awake and fully alert.  Unable to appreciate aphasia, occasional speech hesitancy.  No evidence of dysarthria. Oriented to place and time. Recent and remote memory intact. Attention span, concentration and fund of knowledge appropriate. Mood and affect appropriate although mildly anxious.  Cranial Nerves: Pupils equal, briskly reactive to light. Extraocular movements full without nystagmus. Visual fields full to confrontation. Hearing intact. Facial sensation intact. Face, tongue, palate moves normally and symmetrically.  Motor: Normal bulk and tone. Normal strength in all tested extremity muscles. No evidence of resting or action tremors on exam.  Unable to appreciate hand weakness Sensory.: intact to touch , pinprick , position and vibratory sensation.  Coordination: Rapid alternating movements impaired with finger tapping on bilateral upper extremities. Finger-to-nose and heel-to-shin performed accurately bilaterally although slowed cautious movements. Gait and Station: Arises from chair without difficulty. Stance is normal. Gait demonstrates normal stride length and balance without use of assistive device.   Reflexes: 1+ and symmetric. Toes downgoing.          ASSESSMENT: AZELIE NOGUERA is a 51 y.o. year old female with large left basal ganglia stroke on 04/05/2021 likely secondary to small vessel disease however embolic source cannot be ruled out given size. Vascular risk factors include CAD, HTN, HLD, prior MI, DM with vascular disease and obesity.     PLAN:  Imbalance,  resolved Episode of right hand tremor resolved:  MRI brain 11/2021 no acute/new findings EEG 11/2021 normal  Unknown etiology, balance improved and no additional episode of right hand tremor Advised to call with any reoccurrence or call 911 immediately for any new stroke/TIA symptoms L BG stroke :  Residual deficit: Mild occasional aphasia.  Stable Headaches resolved, no indication for medication management at this time.  Complete 30-day cardiac event monitor -order placed Continue aspirin 81 mg daily  and atorvastatin 80 mg daily for secondary stroke prevention.   Discussed secondary stroke prevention measures and importance of close PCP follow up for aggressive stroke risk factor management including BP goal<130/90, HLD with LDL goal<70 and DM with A1c.<7 Stroke labs: A1c 6.7 (10/2021), LDL 78 (04/2021) I have gone over the pathophysiology of stroke, warning signs and symptoms, risk factors and their management in some detail with instructions to go to the closest emergency room for symptoms of concern.     Doing well from stroke standpoint without further recommendations and risk factors are managed by PCP. She may follow up PRN, as usual for our patients who are strictly being followed for stroke. If any new neurological issues should arise, request PCP place referral for evaluation by one of our neurologists. Thank you.     CC:  PCP: Verlon Au, MD    I spent 32 minutes of face-to-face and non-face-to-face time with patient.  This included previsit chart review, lab review, study review, order entry, electronic health record documentation, patient and husband education regarding prior symptom complaints and review of prior work-up, prior stroke with residual deficits, secondary stroke prevention measures and importance of managing stroke risk factors, and answered all other questions to patient satisfaction  Ihor Austin, AGNP-BC  [facility] [address] [place], [place] [address]  Phone [phone] Fax [phone] Note: This document was prepared with digital dictation and possible smart phrase technology. Any transcriptional errors that result from this process are unintentional.

## 2022-02-24 NOTE — Patient Instructions (Addendum)
Your Plan:  No changes today  Continue current treatment plan with close follow-up with PCP for aggressive stroke risk factor management and ongoing use of aspirin and atorvastatin for secondary stroke prevention measures  You will be contacted to complete a 30-day cardiac monitor to rule out atrial fibrillation   You can plan to follow up as needed at this time but please call with any questions or concerns down the road      Thank you for coming to see [place] at [facility]. I hope we have been able to provide you high quality care today.  You may receive a patient satisfaction survey over the next few weeks. We would appreciate your feedback and comments so that we may continue to improve ourselves and the health of our patients.

## 2022-12-25 ENCOUNTER — Emergency Department (HOSPITAL_COMMUNITY): Payer: BC Managed Care – PPO

## 2022-12-25 ENCOUNTER — Encounter (HOSPITAL_COMMUNITY): Payer: Self-pay | Admitting: Emergency Medicine

## 2022-12-25 ENCOUNTER — Emergency Department (HOSPITAL_COMMUNITY)
Admission: EM | Admit: 2022-12-25 | Discharge: 2022-12-26 | Disposition: A | Discharge status: Home / Self Care | Payer: BC Managed Care – PPO | Attending: Emergency Medicine | Admitting: Emergency Medicine

## 2022-12-25 ENCOUNTER — Other Ambulatory Visit: Payer: Self-pay

## 2022-12-25 DIAGNOSIS — Z79899 Other long term (current) drug therapy: Secondary | ICD-10-CM | POA: Insufficient documentation

## 2022-12-25 DIAGNOSIS — Z8673 Personal history of transient ischemic attack (TIA), and cerebral infarction without residual deficits: Secondary | ICD-10-CM | POA: Insufficient documentation

## 2022-12-25 DIAGNOSIS — Z7982 Long term (current) use of aspirin: Secondary | ICD-10-CM | POA: Insufficient documentation

## 2022-12-25 DIAGNOSIS — I2510 Atherosclerotic heart disease of native coronary artery without angina pectoris: Secondary | ICD-10-CM | POA: Insufficient documentation

## 2022-12-25 DIAGNOSIS — D509 Iron deficiency anemia, unspecified: Secondary | ICD-10-CM | POA: Insufficient documentation

## 2022-12-25 DIAGNOSIS — R0989 Other specified symptoms and signs involving the circulatory and respiratory systems: Secondary | ICD-10-CM | POA: Insufficient documentation

## 2022-12-25 DIAGNOSIS — Z7984 Long term (current) use of oral hypoglycemic drugs: Secondary | ICD-10-CM | POA: Insufficient documentation

## 2022-12-25 DIAGNOSIS — D649 Anemia, unspecified: Secondary | ICD-10-CM

## 2022-12-25 DIAGNOSIS — R4701 Aphasia: Secondary | ICD-10-CM | POA: Insufficient documentation

## 2022-12-25 DIAGNOSIS — I10 Essential (primary) hypertension: Secondary | ICD-10-CM | POA: Insufficient documentation

## 2022-12-25 DIAGNOSIS — E119 Type 2 diabetes mellitus without complications: Secondary | ICD-10-CM | POA: Insufficient documentation

## 2022-12-25 DIAGNOSIS — N289 Disorder of kidney and ureter, unspecified: Secondary | ICD-10-CM | POA: Diagnosis not present

## 2022-12-25 LAB — ETHANOL: Alcohol, Ethyl (B): <10 mg/dL (ref <10)

## 2022-12-25 LAB — DIFFERENTIAL
Abs Immature Granulocytes: 0.03 10*3/uL (ref 0.00–0.07)
Basophils Absolute: 0 10*3/uL (ref 0.0–0.1)
Basophils Relative: 0 %
Eosinophils Absolute: 0.1 10*3/uL (ref 0.0–0.5)
Eosinophils Relative: 1 %
Immature Granulocytes: 0 %
Lymphocytes Relative: 39 %
Lymphs Abs: 4.1 10*3/uL — ABNORMAL HIGH (ref 0.7–4.0)
Monocytes Absolute: 0.6 10*3/uL (ref 0.1–1.0)
Monocytes Relative: 6 %
Neutro Abs: 5.6 10*3/uL (ref 1.7–7.7)
Neutrophils Relative %: 54 %

## 2022-12-25 LAB — CBC
HCT: 36.3 % (ref 36.0–46.0)
Hemoglobin: 11.5 g/dL — ABNORMAL LOW (ref 12.0–15.0)
MCH: 28.4 pg (ref 26.0–34.0)
MCHC: 31.7 g/dL (ref 30.0–36.0)
MCV: 89.6 fL (ref 80.0–100.0)
Platelets: 358 10*3/uL (ref 150–400)
RBC: 4.05 MIL/uL (ref 3.87–5.11)
RDW: 11.9 % (ref 11.5–15.5)
WBC: 10.5 10*3/uL (ref 4.0–10.5)
nRBC: 0 % (ref 0.0–0.2)

## 2022-12-25 LAB — COMPREHENSIVE METABOLIC PANEL
ALT: 12 U/L (ref 0–44)
AST: 19 U/L (ref 15–41)
Albumin: 3.6 g/dL (ref 3.5–5.0)
Alkaline Phosphatase: 79 U/L (ref 38–126)
Anion gap: 8 (ref 5–15)
BUN: 27 mg/dL — ABNORMAL HIGH (ref 6–20)
CO2: 23 mmol/L (ref 22–32)
Calcium: 9.6 mg/dL (ref 8.9–10.3)
Chloride: 105 mmol/L (ref 98–111)
Creatinine, Ser: 1.11 mg/dL — ABNORMAL HIGH (ref 0.44–1.00)
GFR, Estimated: 60 mL/min — ABNORMAL LOW (ref >60)
Glucose, Bld: 142 mg/dL — ABNORMAL HIGH (ref 70–99)
Potassium: 4.7 mmol/L (ref 3.5–5.1)
Sodium: 136 mmol/L (ref 135–145)
Total Bilirubin: 0.5 mg/dL (ref 0.3–1.2)
Total Protein: 7.9 g/dL (ref 6.5–8.1)

## 2022-12-25 LAB — CBG MONITORING, ED: Glucose-Capillary: 142 mg/dL — ABNORMAL HIGH (ref 70–99)

## 2022-12-25 LAB — I-STAT CHEM 8, ED
BUN: 28 mg/dL — ABNORMAL HIGH (ref 6–20)
Calcium, Ion: 1.23 mmol/L (ref 1.15–1.40)
Chloride: 105 mmol/L (ref 98–111)
Creatinine, Ser: 1.2 mg/dL — ABNORMAL HIGH (ref 0.44–1.00)
Glucose, Bld: 141 mg/dL — ABNORMAL HIGH (ref 70–99)
HCT: 38 % (ref 36.0–46.0)
Hemoglobin: 12.9 g/dL (ref 12.0–15.0)
Potassium: 4.8 mmol/L (ref 3.5–5.1)
Sodium: 139 mmol/L (ref 135–145)
TCO2: 24 mmol/L (ref 22–32)

## 2022-12-25 LAB — APTT: aPTT: 28 seconds (ref 24–36)

## 2022-12-25 LAB — I-STAT BETA HCG BLOOD, ED (MC, WL, AP ONLY): I-stat hCG, quantitative: <5 m[IU]/mL (ref <5)

## 2022-12-25 LAB — PROTIME-INR
INR: 1 (ref 0.8–1.2)
Prothrombin Time: 13.7 seconds (ref 11.4–15.2)

## 2022-12-25 MED ORDER — SODIUM CHLORIDE 0.9% FLUSH: 3.0000 mL | Freq: Once | INTRAVENOUS | Status: DC

## 2022-12-25 MED ORDER — LORAZEPAM 1 MG PO TABS
1.0000 mg | ORAL_TABLET | Freq: Once | ORAL | Status: AC
  Administered 2022-12-25: 1 mg via ORAL
  Filled 2022-12-25: qty 1

## 2022-12-25 MED ORDER — LABETALOL HCL 5 MG/ML IV SOLN
20.0000 mg | Freq: Once | INTRAVENOUS | Status: AC
  Administered 2022-12-25: 20 mg via INTRAVENOUS
  Filled 2022-12-25: qty 4

## 2022-12-25 NOTE — ED Provider Notes (Signed)
[place] EMERGENCY DEPARTMENT AT [facility] Provider Note   CSN: [id number] Arrival date & time: 12/25/22  1914     History  Chief Complaint  Patient presents with   Aphasia    Barbara Jacobs is a 52 y.o. female.  The history is provided by the patient.  She has history of hypertension, diabetes, hyperlipidemia, coronary artery disease, stroke and comes in with difficulty with speaking over the last 2 weeks.  She states that she has difficulty finding the word that she wants to use but has no difficulty with slurring her words.  There is no difficulty with comprehension.  She has noted a left temporal headache as well.  Symptoms have been getting gradually worse.  Also of note, she did not take her blood pressure medication today.  Her speech problem is similar to what she had with a stroke in 2022.   Home Medications Prior to Admission medications   Medication Sig Start Date End Date Taking? Authorizing Provider  ALPRAZolam Prudy Feeler) 1 MG tablet Take 1 mg by mouth daily as needed for anxiety. 10/01/20   [provider]  aspirin EC 81 MG tablet Take 81 mg by mouth daily. Swallow whole.    [provider]  atorvastatin (LIPITOR) 80 MG tablet Take 1 tablet (80 mg total) by mouth daily at 6 PM. 07/18/15   Sheliah Hatch, MD  carvedilol (COREG) 6.25 MG tablet Take 1 tablet (6.25 mg total) by mouth 2 (two) times daily with a meal. 10/26/15   Sheliah Hatch, MD  glipiZIDE (GLUCOTROL) 5 MG tablet TAKE ONE TABLET BY MOUTH TWICE DAILY BEFORE A MEAL Patient taking differently: Take 5 mg by mouth 2 (two) times daily before a meal. 05/02/16   Carlus Pavlov, MD  lisinopril (PRINIVIL,ZESTRIL) 40 MG tablet Take 1 tablet (40 mg total) by mouth daily. 10/26/15   Sheliah Hatch, MD  metFORMIN (GLUCOPHAGE) 1000 MG tablet TAKE ONE TABLET BY MOUTH TWICE DAILY WITH A MEAL Patient taking differently: Take 1,000 mg by mouth 2 (two) times daily with a meal.  05/02/16   Carlus Pavlov, MD  Multiple Vitamins-Minerals (MULTIVITAMIN WITH MINERALS) tablet Take 1 tablet by mouth daily.    [provider]  OZEMPIC, 2 MG/DOSE, 8 MG/3ML SOPN SMARTSIG:0.75 Milliliter(s) SUB-Q Once a Week 10/03/21   [provider]  sertraline (ZOLOFT) 50 MG tablet Take 150 mg by mouth daily. 06/07/14   [provider]  zolpidem (AMBIEN) 10 MG tablet Take 1 tablet (10 mg total) by mouth at bedtime as needed. for sleep Patient taking differently: Take 10 mg by mouth at bedtime as needed for sleep. for sleep 05/23/16   Sheliah Hatch, MD      Allergies    Ibuprofen, Naproxen, Pineapple, and Shellfish allergy    Review of Systems   Review of Systems  All other systems reviewed and are negative.   Physical Exam Updated Vital Signs BP (!) 217/109   Pulse 79   Temp 98.1 F (36.7 C) (Oral)   Resp 19   Ht 5\' 6"  (1.676 m)   Wt 103 kg   SpO2 100%   BMI 36.64 kg/m  Physical Exam Vitals and nursing note reviewed.   52 year old female, resting comfortably and in no acute distress. Vital signs are significant for markedly elevated blood pressure. Oxygen saturation is 100%, which is normal. Head is normocephalic and atraumatic. PERRLA, EOMI. Oropharynx is clear.  Fundi are poorly visualized. Neck is nontender  and supple without adenopathy or JVD.  There is a soft left-sided carotid bruit. Back is nontender and there is no CVA tenderness. Lungs are clear without rales, wheezes, or rhonchi. Chest is nontender. Heart has regular rate and rhythm without murmur. Abdomen is soft, flat, nontender. Extremities have no cyanosis or edema, full range of motion is present. Skin is warm and dry without rash. Neurologic: Awake and alert, speech is at times slow and halting with some difficulty with naming, cranial nerves are intact.  Strength is 5/5 in all 4 extremities.  There is no pronator drift.  Sensation is normal.  There is no extinction on double  simultaneous stimulation.  ED Results / Procedures / Treatments   Labs (all labs ordered are listed, but only abnormal results are displayed) Labs Reviewed  CBC - Abnormal; Notable for the following components:      Result Value   Hemoglobin 11.5 (*)    All other components within normal limits  DIFFERENTIAL - Abnormal; Notable for the following components:   Lymphs Abs 4.1 (*)    All other components within normal limits  COMPREHENSIVE METABOLIC PANEL - Abnormal; Notable for the following components:   Glucose, Bld 142 (*)    BUN 27 (*)    Creatinine, Ser 1.11 (*)    GFR, Estimated 60 (*)    All other components within normal limits  I-STAT CHEM 8, ED - Abnormal; Notable for the following components:   BUN 28 (*)    Creatinine, Ser 1.20 (*)    Glucose, Bld 141 (*)    All other components within normal limits  CBG MONITORING, ED - Abnormal; Notable for the following components:   Glucose-Capillary 142 (*)    All other components within normal limits  PROTIME-INR  APTT  ETHANOL  I-STAT BETA HCG BLOOD, ED ([facility], [facility], [facility] ONLY)    EKG EKG Interpretation  Date/Time:  Thursday Dec 25 2022 19:29:54 EDT Ventricular Rate:  78 PR Interval:  158 QRS Duration: 80 QT Interval:  362 QTC Calculation: 412 R Axis:   -17 Text Interpretation: Normal sinus rhythm Inferior infarct , age undetermined Anterior infarct , age undetermined Abnormal ECG When compared with ECG of 05-Apr-2021 15:07, T wave abnormality is no longer present Confirmed by Dione Booze ([id number]) on 12/25/2022 11:35:15 PM  Radiology MR BRAIN WO CONTRAST  Result Date: 12/26/2022 CLINICAL DATA:  Acute neurologic deficit EXAM: MRI HEAD WITHOUT CONTRAST MRA HEAD WITHOUT CONTRAST TECHNIQUE: Multiplanar, multi-echo pulse sequences of the brain and surrounding structures were acquired without intravenous contrast. Angiographic images of the Circle of Willis were acquired using MRA technique without intravenous contrast. COMPARISON:   11/05/2021 FINDINGS: MRI HEAD FINDINGS Brain: No acute infarct, mass effect or extra-axial collection. No acute or chronic hemorrhage. 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 The midline structures are normal. Vascular: Major flow voids are preserved. Skull and upper cervical spine: Normal calvarium and skull base. Visualized upper cervical spine and soft tissues are normal. Sinuses/Orbits:Left maxillary sinus mucosal thickening and fluid level. Normal orbits. MRA HEAD FINDINGS POSTERIOR CIRCULATION: --Vertebral arteries: Normal --Inferior cerebellar arteries: Normal. --Basilar artery: Normal. --Superior cerebellar arteries: Normal. --Posterior cerebral arteries: Normal. The right PCA is predominantly supplied by the posterior communicating artery. ANTERIOR CIRCULATION: --Intracranial internal carotid arteries: Mild atherosclerotic irregularity with mild narrowing. --Anterior cerebral arteries (ACA): Normal. --Middle cerebral arteries (MCA): Normal. IMPRESSION: 1. No acute intracranial abnormality. 2. 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 3. Normal intracranial MRA. Electronically  Signed   By: Deatra Robinson M.D.   On: 12/26/2022 01:10   MR ANGIO HEAD WO CONTRAST  Result Date: 12/26/2022 CLINICAL DATA:  Acute neurologic deficit EXAM: MRI HEAD WITHOUT CONTRAST MRA HEAD WITHOUT CONTRAST TECHNIQUE: Multiplanar, multi-echo pulse sequences of the brain and surrounding structures were acquired without intravenous contrast. Angiographic images of the Circle of Willis were acquired using MRA technique without intravenous contrast. COMPARISON:  11/05/2021 FINDINGS: MRI HEAD FINDINGS Brain: No acute infarct, mass effect or extra-axial collection. No acute or chronic hemorrhage. 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 The midline structures are normal. Vascular: Major flow voids are preserved. Skull and upper cervical spine: Normal calvarium and skull base. Visualized upper cervical  spine and soft tissues are normal. Sinuses/Orbits:Left maxillary sinus mucosal thickening and fluid level. Normal orbits. MRA HEAD FINDINGS POSTERIOR CIRCULATION: --Vertebral arteries: Normal --Inferior cerebellar arteries: Normal. --Basilar artery: Normal. --Superior cerebellar arteries: Normal. --Posterior cerebral arteries: Normal. The right PCA is predominantly supplied by the posterior communicating artery. ANTERIOR CIRCULATION: --Intracranial internal carotid arteries: Mild atherosclerotic irregularity with mild narrowing. --Anterior cerebral arteries (ACA): Normal. --Middle cerebral arteries (MCA): Normal. IMPRESSION: 1. No acute intracranial abnormality. 2. 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 3. Normal intracranial MRA. Electronically Signed   By: Deatra Robinson M.D.   On: 12/26/2022 01:10   CT HEAD WO CONTRAST  Result Date: 12/25/2022 CLINICAL DATA:  Acute neurological deficit. EXAM: CT HEAD WITHOUT CONTRAST TECHNIQUE: Contiguous axial images were obtained from the base of the skull through the vertex without intravenous contrast. RADIATION DOSE REDUCTION: This exam was performed according to the departmental dose-optimization program which includes automated exposure control, adjustment of the mA and/or kV according to patient size and/or use of iterative reconstruction technique. COMPARISON:  None Available. FINDINGS: Brain: No evidence of acute infarction, hemorrhage, hydrocephalus, extra-axial collection or mass lesion/mass effect. There is mild, symmetric, bilateral basal ganglia calcification. A chronic left basal ganglia infarct is noted. Vascular: No hyperdense vessel or unexpected calcification. Skull: Normal. Negative for fracture or focal lesion. Sinuses/Orbits: There is marked severity left maxillary sinus and left ethmoid sinus mucosal thickening. Mild to moderate severity right ethmoid sinus mucosal thickening is also seen. Other: None. IMPRESSION: 1. No acute intracranial  abnormality. 2. Chronic left basal ganglia infarct. 3. Bilateral ethmoid sinus and left maxillary sinus disease. Electronically Signed   By: Aram Candela M.D.   On: 12/25/2022 19:49    Procedures Procedures  Cardiac monitor shows normal sinus rhythm, per my interpretation.  Medications Ordered in ED Medications  labetalol (NORMODYNE) injection 20 mg (20 mg Intravenous Given 12/25/22 2344)  LORazepam (ATIVAN) tablet 1 mg (1 mg Oral Given 12/25/22 2348)  carvedilol (COREG) tablet 6.25 mg (6.25 mg Oral Given 12/26/22 0131)  lisinopril (ZESTRIL) tablet 40 mg (40 mg Oral Given 12/26/22 0131)    ED Course/ Medical Decision Making/ A&P                             Medical Decision Making Amount and/or Complexity of Data Reviewed Labs: ordered. Radiology: ordered.  Risk Prescription drug management.   Expressive aphasia concerning for recent stroke.  Uncontrolled hypertension.  CT of head shows old left basal ganglia infarct but no evidence of acute stroke.  I have independently viewed the images, and agree with the radiologist's interpretation.  No hemorrhage seen.  I have reviewed and interpreted her electrocardiogram, and my interpretation is age undetermined  inferior and anterior infarcts but no ST or T changes.  I have reviewed and interpreted her laboratory tests, and my interpretation is stable mild renal insufficiency, stable anemia, undetectable ethanol.  Patient's last known normal was 2 weeks ago, while outside the window for activating code stroke.  I have ordered a dose of labetalol to try to control her blood pressure.  I have reviewed her past records, and she was admitted on 04/05/2021 for stroke with manifestation of expressive aphasia.  History and physical at that time specifically stated no carotid bruits.  CT angiogram of the neck did show carotid bifurcation atherosclerosis which was actually greater on the right.  New bruit is concerning for progression of carotid  atherosclerosis.  I have ordered an MRI to look for evidence of new stroke.  MRI shows no evidence of new stroke.  MRA shows no significant occlusions.  I have independently viewed the images, and agree with the radiologist's interpretation.  On return from MRI scan, patient's blood pressure is down to 170/104 and her headache has resolved.  I have ordered a dose of her carvedilol and lisinopril which she missed taking this morning.  Since blood pressure is now in an acceptable range, I do not feel she needs to be admitted.  I am encouraging her to make sure she takes her blood pressure medication as prescribed and continues to monitor her blood pressures at home.  I have referred her to neurology for further outpatient evaluation.  Return precautions were discussed.  CRITICAL CARE Performed by: Dione Booze Total critical care time: 45 minutes Critical care time was exclusive of separately billable procedures and treating other patients. Critical care was necessary to treat or prevent imminent or life-threatening deterioration. Critical care was time spent personally by me on the following activities: development of treatment plan with patient and/or surrogate as well as nursing, discussions with consultants, evaluation of patient's response to treatment, examination of patient, obtaining history from patient or surrogate, ordering and performing treatments and interventions, ordering and review of laboratory studies, ordering and review of radiographic studies, pulse oximetry and re-evaluation of patient's condition.  Final Clinical Impression(s) / ED Diagnoses Final diagnoses:  Expressive aphasia  Elevated blood pressure reading with diagnosis of hypertension  Normochromic normocytic anemia  Renal insufficiency  Left carotid bruit    Rx / DC Orders ED Discharge Orders     None         Dione Booze, MD 12/26/22 [phone]

## 2022-12-25 NOTE — ED Triage Notes (Signed)
Pt c/o difficulty finding her words and having slurred speech x2 weeks. S/s worsening today. New onset headache today.  Followed up with PCP today recommended ED visit. BP 217/109, missed this morning BP med dose.

## 2022-12-26 MED ORDER — CARVEDILOL 3.125 MG PO TABS
6.2500 mg | ORAL_TABLET | Freq: Once | ORAL | Status: AC
  Administered 2022-12-26: 6.25 mg via ORAL
  Filled 2022-12-26: qty 2

## 2022-12-26 MED ORDER — LISINOPRIL 20 MG PO TABS
40.0000 mg | ORAL_TABLET | Freq: Once | ORAL | Status: AC
  Administered 2022-12-26: 40 mg via ORAL
  Filled 2022-12-26: qty 2

## 2022-12-26 NOTE — Discharge Instructions (Addendum)
Your MRI scan did not show any sign of a new stroke.  Your blood pressure has come down with medication in the emergency department.  Please resume taking your blood pressure medication as prescribed and continue monitoring your blood pressure at home.  Your exam showed that you have a bruit which can indicate a partial blockage of blood flow in your left carotid artery.  Please discuss this with your primary care provider who may choose to send you for an ultrasound to have this evaluated.  Return if you have any new or concerning symptoms.

## 2024-01-03 ENCOUNTER — Other Ambulatory Visit: Payer: Self-pay

## 2024-01-03 ENCOUNTER — Emergency Department (HOSPITAL_BASED_OUTPATIENT_CLINIC_OR_DEPARTMENT_OTHER)
Admission: EM | Admit: 2024-01-03 | Discharge: 2024-01-03 | Disposition: A | Discharge status: Home / Self Care | Attending: Emergency Medicine | Admitting: Emergency Medicine

## 2024-01-03 ENCOUNTER — Encounter (HOSPITAL_BASED_OUTPATIENT_CLINIC_OR_DEPARTMENT_OTHER): Payer: Self-pay | Admitting: Emergency Medicine

## 2024-01-03 DIAGNOSIS — M62830 Muscle spasm of back: Secondary | ICD-10-CM | POA: Insufficient documentation

## 2024-01-03 DIAGNOSIS — Z7982 Long term (current) use of aspirin: Secondary | ICD-10-CM | POA: Diagnosis not present

## 2024-01-03 DIAGNOSIS — M545 Low back pain, unspecified: Secondary | ICD-10-CM | POA: Diagnosis present

## 2024-01-03 DIAGNOSIS — R519 Headache, unspecified: Secondary | ICD-10-CM | POA: Diagnosis not present

## 2024-01-03 DIAGNOSIS — W01198A Fall on same level from slipping, tripping and stumbling with subsequent striking against other object, initial encounter: Secondary | ICD-10-CM | POA: Diagnosis not present

## 2024-01-03 DIAGNOSIS — M25511 Pain in right shoulder: Secondary | ICD-10-CM | POA: Insufficient documentation

## 2024-01-03 DIAGNOSIS — M25512 Pain in left shoulder: Secondary | ICD-10-CM | POA: Insufficient documentation

## 2024-01-03 MED ORDER — CYCLOBENZAPRINE HCL 10 MG PO TABS: 10.0000 mg | ORAL_TABLET | Freq: Three times a day (TID) | ORAL | 0 refills | Status: AC | PRN

## 2024-01-03 MED ORDER — CYCLOBENZAPRINE HCL 5 MG PO TABS
5.0000 mg | ORAL_TABLET | Freq: Once | ORAL | Status: AC
  Administered 2024-01-03: 5 mg via ORAL
  Filled 2024-01-03: qty 1

## 2024-01-03 NOTE — Discharge Instructions (Signed)
 You were seen in the emerged department for injuries after a fall about a week ago Your pain is most likely due to a muscle spasm in your lower back For this reason we have prescribed Flexeril which is a muscle relaxer Do not drink alcohol or drive will take this medication as it can make you drowsy Take Tylenol  as directed for mild to moderate pain Take Flexeril for severe pain muscle spasms Follow-up with your primary care doctor in 1 week for reevaluation Return to the emerged part for severe pain, if you are unable to walk or have any other concerns

## 2024-01-03 NOTE — ED Provider Notes (Signed)
 [place] EMERGENCY DEPARTMENT AT [facility] Provider Note   CSN: [id number] Arrival date & time: 01/03/24  1859     History  No chief complaint on file.   Barbara Jacobs is a 53 y.o. female.  Who presents to ED after fall.  Patient reports tripping over her dog gate at home 6 nights ago.  She fell onto her back and struck her head.  No LOC.  She was not evaluated.  Has had pain in her head shoulders but mostly in her right lower back since then.  Pain made worse with changing positions.  Tylenol  and effective pain relief.  No numbness in her lower extremities.  Has been able to walk without issue.  HPI     Home Medications Prior to Admission medications   Medication Sig Start Date End Date Taking? Authorizing Provider  cyclobenzaprine (FLEXERIL) 10 MG tablet Take 1 tablet (10 mg total) by mouth 3 (three) times daily as needed for up to 4 days for muscle spasms. 01/03/24 01/07/24 Yes Sallyanne Creamer, DO  ALPRAZolam  (XANAX ) 1 MG tablet Take 1 mg by mouth daily as needed for anxiety. 10/01/20   [provider]  aspirin  EC 81 MG tablet Take 81 mg by mouth daily. Swallow whole.    [provider]  atorvastatin  (LIPITOR ) 80 MG tablet Take 1 tablet (80 mg total) by mouth daily at 6 PM. 07/18/15   Tabori, Katherine E, MD  carvedilol  (COREG ) 6.25 MG tablet Take 1 tablet (6.25 mg total) by mouth 2 (two) times daily with a meal. 10/26/15   Jess Morita, MD  glipiZIDE  (GLUCOTROL ) 5 MG tablet TAKE ONE TABLET BY MOUTH TWICE DAILY BEFORE A MEAL Patient taking differently: Take 5 mg by mouth 2 (two) times daily before a meal. 05/02/16   Emilie Harden, MD  lisinopril  (PRINIVIL ,ZESTRIL ) 40 MG tablet Take 1 tablet (40 mg total) by mouth daily. 10/26/15   Tabori, Katherine E, MD  metFORMIN  (GLUCOPHAGE ) 1000 MG tablet TAKE ONE TABLET BY MOUTH TWICE DAILY WITH A MEAL Patient taking differently: Take 1,000 mg by mouth 2 (two) times daily with a meal. 05/02/16   Emilie Harden, MD  Multiple Vitamins-Minerals (MULTIVITAMIN WITH MINERALS) tablet Take 1 tablet by mouth daily.    [provider]  OZEMPIC, 2 MG/DOSE, 8 MG/3ML SOPN SMARTSIG:0.75 Milliliter(s) SUB-Q Once a Week 10/03/21   [provider]  sertraline  (ZOLOFT ) 50 MG tablet Take 150 mg by mouth daily. 06/07/14   [provider]  zolpidem  (AMBIEN ) 10 MG tablet Take 1 tablet (10 mg total) by mouth at bedtime as needed. for sleep Patient taking differently: Take 10 mg by mouth at bedtime as needed for sleep. for sleep 05/23/16   Tabori, Katherine E, MD      Allergies    Ibuprofen, Naproxen, Pineapple, and Shellfish allergy    Review of Systems   Review of Systems  Physical Exam Updated Vital Signs BP (!) 170/98 (BP Location: Right Arm)   Pulse 81   Temp 97.9 F (36.6 C)   Resp 19   SpO2 98%  Physical Exam Vitals and nursing note reviewed.  HENT:     Head: Normocephalic and atraumatic.  Eyes:     Pupils: Pupils are equal, round, and reactive to light.  Cardiovascular:     Rate and Rhythm: Normal rate and regular rhythm.  Pulmonary:     Effort: Pulmonary effort is normal.     Breath sounds: Normal breath sounds.  Abdominal:  Palpations: Abdomen is soft.     Tenderness: There is no abdominal tenderness.  Musculoskeletal:     Cervical back: Neck supple. No rigidity or tenderness.     Comments: No midline tender step-off deformity of thoracic or lumbar spine Significant right lumbar tenderness to deep palpation throughout the lower lumbar region 5 out of 5 motor strength bilateral upper and lower extremities  Skin:    General: Skin is warm and dry.  Neurological:     Mental Status: She is alert.  Psychiatric:        Mood and Affect: Mood normal.     ED Results / Procedures / Treatments   Labs (all labs ordered are listed, but only abnormal results are displayed) Labs Reviewed - No data to display  EKG None  Radiology No results  found.  Procedures Procedures    Medications Ordered in ED Medications  cyclobenzaprine (FLEXERIL) tablet 5 mg (has no administration in time range)    ED Course/ Medical Decision Making/ A&P                                 Medical Decision Making 53 year old female with history as above presenting to the ED after a fall 6 days ago.  Marvell Slider backwards striking her head lower back.  Mild headaches some neck discomfort but most of her pain in the right lower back.  No midline tenderness.  Lower extremities and upper extremities are without evidence of trauma.  Full active range of motion nexus criteria negative.  No significant evidence of head trauma.  She likely has a concussion and right lumbar strain.  Instructed her to keep taking Tylenol  as directed we will try Flexeril here for likely muscle spasm of the right lumbar region and instruct her PCP follow-up.  Risk Prescription drug management.           Final Clinical Impression(s) / ED Diagnoses Final diagnoses:  Lumbar paraspinal muscle spasm    Rx / DC Orders ED Discharge Orders          Ordered    cyclobenzaprine (FLEXERIL) 10 MG tablet  3 times daily PRN        01/03/24 2116              Sallyanne Creamer, DO 01/03/24 2117

## 2024-01-03 NOTE — ED Triage Notes (Signed)
 Trip and fall last week. Hit head no LOC Pain in lower right back and headache  Pain worse with movement
